# Patient Record
Sex: Female | Born: 1965
Health system: Southern US, Community
[De-identification: ages and names within clinical notes are randomized; demographics above are authoritative.]

## PROBLEM LIST (undated history)

## (undated) DIAGNOSIS — E785 Hyperlipidemia, unspecified: Secondary | ICD-10-CM

## (undated) DIAGNOSIS — I10 Essential (primary) hypertension: Secondary | ICD-10-CM

## (undated) HISTORY — DX: Hyperlipidemia, unspecified: E78.5

## (undated) HISTORY — DX: Essential (primary) hypertension: I10

---

## 2008-06-14 ENCOUNTER — Ambulatory Visit: Payer: Self-pay | Admitting: Internal Medicine

## 2009-07-19 ENCOUNTER — Ambulatory Visit: Payer: Self-pay | Admitting: Internal Medicine

## 2009-09-06 ENCOUNTER — Ambulatory Visit (HOSPITAL_COMMUNITY): Admission: RE | Admit: 2009-09-06 | Discharge: 2009-09-06 | Payer: Self-pay | Admitting: Internal Medicine

## 2010-05-10 ENCOUNTER — Ambulatory Visit (HOSPITAL_COMMUNITY): Admission: RE | Admit: 2010-05-10 | Discharge: 2010-05-10 | Payer: Self-pay

## 2010-08-14 ENCOUNTER — Ambulatory Visit: Payer: Self-pay | Admitting: Internal Medicine

## 2012-01-16 ENCOUNTER — Ambulatory Visit (INDEPENDENT_AMBULATORY_CARE_PROVIDER_SITE_OTHER): Payer: 59 | Admitting: Internal Medicine

## 2012-01-16 ENCOUNTER — Encounter: Payer: Self-pay | Admitting: Internal Medicine

## 2012-01-16 ENCOUNTER — Other Ambulatory Visit (HOSPITAL_COMMUNITY)
Admission: RE | Admit: 2012-01-16 | Discharge: 2012-01-16 | Disposition: A | Discharge status: Home / Self Care | Payer: 59 | Source: Ambulatory Visit | Attending: Internal Medicine | Admitting: Internal Medicine

## 2012-01-16 VITALS — BP 130/82 | HR 108 | Temp 97.8°F | Resp 16 | Ht 70.0 in | Wt 249.8 lb

## 2012-01-16 DIAGNOSIS — E785 Hyperlipidemia, unspecified: Secondary | ICD-10-CM

## 2012-01-16 DIAGNOSIS — Z1159 Encounter for screening for other viral diseases: Secondary | ICD-10-CM | POA: Insufficient documentation

## 2012-01-16 DIAGNOSIS — M545 Low back pain, unspecified: Secondary | ICD-10-CM

## 2012-01-16 DIAGNOSIS — Z01419 Encounter for gynecological examination (general) (routine) without abnormal findings: Secondary | ICD-10-CM | POA: Insufficient documentation

## 2012-01-16 DIAGNOSIS — Z1239 Encounter for other screening for malignant neoplasm of breast: Secondary | ICD-10-CM

## 2012-01-16 DIAGNOSIS — E7800 Pure hypercholesterolemia, unspecified: Secondary | ICD-10-CM | POA: Insufficient documentation

## 2012-01-16 DIAGNOSIS — I10 Essential (primary) hypertension: Secondary | ICD-10-CM | POA: Insufficient documentation

## 2012-01-16 DIAGNOSIS — L719 Rosacea, unspecified: Secondary | ICD-10-CM | POA: Insufficient documentation

## 2012-01-16 DIAGNOSIS — Z0000 Encounter for general adult medical examination without abnormal findings: Secondary | ICD-10-CM

## 2012-01-16 LAB — LIPID PANEL
HDL: 75.4 mg/dL (ref >39.00)
Triglycerides: 61 mg/dL (ref 0.0–149.0)
VLDL: 12.2 mg/dL (ref 0.0–40.0)

## 2012-01-16 LAB — CBC WITH DIFFERENTIAL/PLATELET
Basophils Relative: 0.4 % (ref 0.0–3.0)
HCT: 39.7 % (ref 36.0–46.0)
Hemoglobin: 13.7 g/dL (ref 12.0–15.0)
Lymphs Abs: 2.2 10*3/uL (ref 0.7–4.0)
Neutrophils Relative %: 59 % (ref 43.0–77.0)
WBC: 7 10*3/uL (ref 4.5–10.5)

## 2012-01-16 LAB — COMPREHENSIVE METABOLIC PANEL
ALT: 26 U/L (ref 0–35)
AST: 24 U/L (ref 0–37)
BUN: 11 mg/dL (ref 6–23)
Calcium: 9.3 mg/dL (ref 8.4–10.5)
Chloride: 104 mEq/L (ref 96–112)
GFR: 126.93 mL/min (ref >60.00)
Glucose, Bld: 85 mg/dL (ref 70–99)
Potassium: 3.6 mEq/L (ref 3.5–5.1)
Sodium: 138 mEq/L (ref 135–145)
Total Protein: 7.2 g/dL (ref 6.0–8.3)

## 2012-01-16 LAB — HM PAP SMEAR: HM Pap smear: NEGATIVE

## 2012-01-16 MED ORDER — SIMVASTATIN 10 MG PO TABS: 10.0000 mg | ORAL_TABLET | Freq: Every day | ORAL | Status: DC

## 2012-01-16 MED ORDER — METRONIDAZOLE 1 % EX GEL: Freq: Every day | CUTANEOUS | Status: AC

## 2012-01-16 MED ORDER — HYDROCHLOROTHIAZIDE 25 MG PO TABS: 25.0000 mg | ORAL_TABLET | Freq: Every day | ORAL | Status: DC

## 2012-01-16 NOTE — Assessment & Plan Note (Signed)
Will check lipids and LFTs today. Continue Simvastatin.

## 2012-01-16 NOTE — Assessment & Plan Note (Signed)
Encouraged her to continue with exercises started by Dr. Lorin Picket. Will get plain film of lumbar spine as symptoms have been persistent. If no improvement, will add PT.

## 2012-01-16 NOTE — Progress Notes (Signed)
Subjective:    [name], female    DOB: 1966-06-23, 46 y.o.   MRN: [id number]  HPI 46 year old female with history of hypertension and hyperlipidemia presents to establish care. She reports she is generally feeling well. She notes several year history of gradually worsening lower back pain which radiates down both of her posterior thighs. She notes she talk to her previous physician about this and was given exercises to help with symptoms. She reports no improvement. She continues to use ibuprofen 800 mg once daily with some improvement after the medication. She denies weakness in her legs. She denies any known injury to her lower back.  She is also concerned today about a rash over her forehead and cheeks. This has been present for several months. It seems to get worse and become more red in color when she is anxious her hot. She has not applied any topical treatments, with the exception of Neutrogena spin brush.  In regards to her hypertension and hyperlipidemia she reports full compliance with her medications. She denies any myalgia, chest pain, headache, palpitations. She follows a healthy diet with limited intake of saturated fat and exercises by walking on a regular basis.  Outpatient Encounter Prescriptions as of 01/16/2012  Medication Sig Dispense Refill  . Biotin 10 MG TABS Take by mouth.      . fish oil-omega-3 fatty acids 1000 MG capsule Take 2 g by mouth daily.      . hydrochlorothiazide (HYDRODIURIL) 25 MG tablet Take 1 tablet (25 mg total) by mouth daily.  90 tablet  3  . ibuprofen (ADVIL,MOTRIN) 200 MG tablet Take 200 mg by mouth every 6 (six) hours as needed.      . simvastatin (ZOCOR) 10 MG tablet Take 1 tablet (10 mg total) by mouth at bedtime.  90 tablet  3  . vitamin B-12 (CYANOCOBALAMIN) 100 MCG tablet Take 50 mcg by mouth daily.      . metroNIDAZOLE (METROGEL) 1 % gel Apply topically daily.  45 g  0    Review of Systems  Constitutional: Negative for  fever, chills, appetite change, fatigue and unexpected weight change.  HENT: Negative for ear pain, congestion, sore throat, trouble swallowing, neck pain, voice change and sinus pressure.   Eyes: Negative for visual disturbance.  Respiratory: Negative for cough, shortness of breath, wheezing and stridor.   Cardiovascular: Negative for chest pain, palpitations and leg swelling.  Gastrointestinal: Negative for nausea, vomiting, abdominal pain, diarrhea, constipation, blood in stool, abdominal distention and anal bleeding.  Genitourinary: Negative for dysuria and flank pain.  Musculoskeletal: Positive for back pain. Negative for myalgias, arthralgias and gait problem.  Skin: Positive for rash. Negative for color change.  Neurological: Negative for dizziness and headaches.  Hematological: Negative for adenopathy. Does not bruise/bleed easily.  Psychiatric/Behavioral: Negative for suicidal ideas, sleep disturbance and dysphoric mood. The patient is not nervous/anxious.    BP 130/82  Pulse 108  Temp(Src) 97.8 F (36.6 C) (Oral)  Resp 16  Ht 5\' 10"  (1.778 m)  Wt 249 lb 12 oz (113.286 kg)  BMI 35.84 kg/m2  SpO2 97%     Objective:   Physical Exam  Constitutional: She is oriented to person, place, and time. She appears well-developed and well-nourished. No distress.  HENT:  Head: Normocephalic and atraumatic.  Right Ear: External ear normal.  Left Ear: External ear normal.  Nose: Nose normal.  Mouth/Throat: Oropharynx is clear and moist. No oropharyngeal exudate.  Eyes: Conjunctivae are normal. Pupils are  equal, round, and reactive to light. Right eye exhibits no discharge. Left eye exhibits no discharge. No scleral icterus.  Neck: Normal range of motion. Neck supple. No tracheal deviation present. No thyromegaly present.  Cardiovascular: Normal rate, regular rhythm, normal heart sounds and intact distal pulses.  Exam reveals no gallop and no friction rub.   No murmur  heard. Pulmonary/Chest: Effort normal and breath sounds normal. No respiratory distress. She has no wheezes. She has no rales. She exhibits no tenderness.  Abdominal: Soft. Bowel sounds are normal. She exhibits no distension and no mass. There is no tenderness. There is no rebound and no guarding.  Genitourinary: Rectum normal, vagina normal and uterus normal. No breast swelling, tenderness, discharge or bleeding. Pelvic exam was performed with patient prone. There is no rash, tenderness or lesion on the right labia. There is no rash, tenderness or lesion on the left labia. Uterus is not enlarged and not tender. Cervix exhibits no motion tenderness, no discharge and no friability. Right adnexum displays no mass, no tenderness and no fullness. Left adnexum displays no mass, no tenderness and no fullness. No erythema or tenderness around the vagina. No vaginal discharge found.  Musculoskeletal: Normal range of motion. She exhibits no edema and no tenderness.       Lumbar back: She exhibits pain. She exhibits normal range of motion and no tenderness.  Lymphadenopathy:    She has no cervical adenopathy.  Neurological: She is alert and oriented to person, place, and time. No cranial nerve deficit. She exhibits normal muscle tone. Coordination normal.  Skin: Skin is warm and dry. Rash (few papules over nose, forehead, and cheeks) noted. She is not diaphoretic. No erythema. No pallor.  Psychiatric: She has a normal mood and affect. Her behavior is normal. Judgment and thought content normal.          Assessment & Plan:

## 2012-01-16 NOTE — Assessment & Plan Note (Signed)
Exam is normal today including breast and pelvic exam. PAP is pending.  Will check CBC, CMP, lipids, TSH with labs. Mammogram ordered. Will obtain previous records. Follow up 6 months.

## 2012-01-16 NOTE — Assessment & Plan Note (Signed)
Facial rash most consistent with rosacea. Will try adding Metronidazole gel. If no improvement, will set up dermatology referral.

## 2012-01-16 NOTE — Assessment & Plan Note (Signed)
Breast exam normal today. Mammogram ordered. 

## 2012-01-16 NOTE — Assessment & Plan Note (Signed)
BP well controlled. Will check renal function with labs. Continue HCTZ. Follow up 6 months.

## 2012-01-20 ENCOUNTER — Ambulatory Visit (INDEPENDENT_AMBULATORY_CARE_PROVIDER_SITE_OTHER)
Admission: RE | Admit: 2012-01-20 | Discharge: 2012-01-20 | Disposition: A | Discharge status: Home / Self Care | Payer: 59 | Source: Ambulatory Visit | Attending: Internal Medicine | Admitting: Internal Medicine

## 2012-01-20 DIAGNOSIS — M545 Low back pain, unspecified: Secondary | ICD-10-CM

## 2012-01-24 ENCOUNTER — Telehealth: Payer: Self-pay | Admitting: *Deleted

## 2012-01-24 MED ORDER — FLUCONAZOLE 150 MG PO TABS: 150.0000 mg | ORAL_TABLET | Freq: Once | ORAL | Status: AC

## 2012-01-24 NOTE — Telephone Encounter (Signed)
MyChart mess sent

## 2012-01-24 NOTE — Telephone Encounter (Signed)
Message copied by Vernie Murders on Fri Jan 24, 2012  3:56 PM ------      Message from: Ronna Polio A      Created: Fri Jan 24, 2012  3:44 PM       PAP was normal. HPV was negative. The pathologist saw yeast on smear, so we should treat with Diflucan 150mg  x 1

## 2012-01-26 LAB — HM PAP SMEAR: HM Pap smear: NORMAL

## 2012-02-11 ENCOUNTER — Ambulatory Visit: Payer: Self-pay | Admitting: Internal Medicine

## 2012-02-14 ENCOUNTER — Encounter: Payer: Self-pay | Admitting: Internal Medicine

## 2012-05-26 ENCOUNTER — Telehealth: Payer: Self-pay | Admitting: Internal Medicine

## 2012-05-26 NOTE — Telephone Encounter (Signed)
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 Because of back problems  She is going to dr Corliss Blacker her chiropractor.  Can you do this note or does dr Corliss Blacker need to do this

## 2012-05-26 NOTE — Telephone Encounter (Signed)
I don't really understand this message, but sounds like should get from her chiropractor if he is treating back pain.

## 2012-05-27 ENCOUNTER — Encounter: Payer: Self-pay | Admitting: Internal Medicine

## 2012-05-27 NOTE — Telephone Encounter (Signed)
Spoke with patient and she stated that she needs a letter from Dr. Dan Humphreys stating that she has seen a Chiropractor and should not be doing any physical activity due to her back problem, slipped disc at L4-L5.  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  She would like to pick up letter this afternoon.

## 2012-05-27 NOTE — Telephone Encounter (Signed)
Left message on cell phone voicemail for patient to return call. 

## 2012-07-22 ENCOUNTER — Ambulatory Visit: Payer: 59 | Admitting: Internal Medicine

## 2012-07-24 ENCOUNTER — Ambulatory Visit (INDEPENDENT_AMBULATORY_CARE_PROVIDER_SITE_OTHER): Payer: 59 | Admitting: Internal Medicine

## 2012-07-24 ENCOUNTER — Encounter: Payer: Self-pay | Admitting: Internal Medicine

## 2012-07-24 VITALS — BP 132/80 | HR 67 | Temp 98.8°F | Ht 70.0 in | Wt 247.5 lb

## 2012-07-24 DIAGNOSIS — E559 Vitamin D deficiency, unspecified: Secondary | ICD-10-CM | POA: Insufficient documentation

## 2012-07-24 DIAGNOSIS — I10 Essential (primary) hypertension: Secondary | ICD-10-CM

## 2012-07-24 DIAGNOSIS — E785 Hyperlipidemia, unspecified: Secondary | ICD-10-CM

## 2012-07-24 DIAGNOSIS — M545 Low back pain, unspecified: Secondary | ICD-10-CM

## 2012-07-24 LAB — COMPREHENSIVE METABOLIC PANEL
AST: 21 U/L (ref 0–37)
Albumin: 3.9 g/dL (ref 3.5–5.2)
BUN: 11 mg/dL (ref 6–23)
CO2: 26 mEq/L (ref 19–32)
Calcium: 9.4 mg/dL (ref 8.4–10.5)
Creatinine, Ser: 0.7 mg/dL (ref 0.4–1.2)
Glucose, Bld: 92 mg/dL (ref 70–99)

## 2012-07-24 NOTE — Assessment & Plan Note (Signed)
Patient with ongoing issues with lower back pain likely secondary to spondylolisthesis. Orthopedic evaluation is pending. Symptoms currently improved with use of ibuprofen as needed. Will continue.

## 2012-07-24 NOTE — Assessment & Plan Note (Signed)
Blood pressure is generally been well controlled on hydrochlorothiazide. We'll continue. Will check renal function with labs today. Follow up 6 months or sooner as needed.

## 2012-07-24 NOTE — Assessment & Plan Note (Signed)
Patient with history of vitamin D deficiency. Will check vitamin D level with labs today.

## 2012-07-24 NOTE — Assessment & Plan Note (Signed)
Will check lipids and LFTs with labs today. Continue simvastatin. 

## 2012-07-24 NOTE — Progress Notes (Signed)
Subjective:    [name], female    DOB: Jul 18, 1966, 46 y.o.   MRN: [id number]  HPI 46 year old female with history of hypertension, hyperlipidemia, and low back pain presents for followup. She reports she is generally been doing well. She reports that back pain has been persistent. Plain x-ray of the lumbar spine performed at her last visit showed anterior slippage of L4. He was recommended that she be evaluated by orthopedics. She has not yet scheduled this appointment but is planning to schedule evaluation after her upcoming vacation. She reports that back pain is currently managed with use of ibuprofen.  In regards to hypertension and hyperlipidemia, she reports full compliance with medications. She does not regularly check her blood pressure. She denies any chest pain, headache, palpitations.  Outpatient Encounter Prescriptions as of 07/24/2012  Medication Sig Dispense Refill  . Biotin 10 MG TABS Take by mouth.      . fish oil-omega-3 fatty acids 1000 MG capsule Take 2 g by mouth daily.      . hydrochlorothiazide (HYDRODIURIL) 25 MG tablet Take 1 tablet (25 mg total) by mouth daily.  90 tablet  3  . ibuprofen (ADVIL,MOTRIN) 200 MG tablet Take 200 mg by mouth every 6 (six) hours as needed.      . metroNIDAZOLE (METROGEL) 1 % gel Apply topically daily.  45 g  0  . simvastatin (ZOCOR) 10 MG tablet Take 1 tablet (10 mg total) by mouth at bedtime.  90 tablet  3  . vitamin B-12 (CYANOCOBALAMIN) 100 MCG tablet Take 50 mcg by mouth daily.       BP 132/80  Pulse 67  Temp 98.8 F (37.1 C) (Oral)  Ht 5\' 10"  (1.778 m)  Wt 247 lb 8 oz (112.265 kg)  BMI 35.51 kg/m2  SpO2 98%  Review of Systems  Constitutional: Negative for fever, chills, appetite change, fatigue and unexpected weight change.  HENT: Negative for ear pain, congestion, sore throat, trouble swallowing, neck pain, voice change and sinus pressure.   Eyes: Negative for visual disturbance.  Respiratory: Negative  for cough, shortness of breath, wheezing and stridor.   Cardiovascular: Negative for chest pain, palpitations and leg swelling.  Gastrointestinal: Negative for nausea, vomiting, abdominal pain, diarrhea, constipation, blood in stool, abdominal distention and anal bleeding.  Genitourinary: Negative for dysuria and flank pain.  Musculoskeletal: Positive for myalgias and arthralgias. Negative for gait problem.  Skin: Negative for color change and rash.  Neurological: Negative for dizziness and headaches.  Hematological: Negative for adenopathy. Does not bruise/bleed easily.  Psychiatric/Behavioral: Negative for suicidal ideas, disturbed wake/sleep cycle and dysphoric mood. The patient is not nervous/anxious.        Objective:   Physical Exam  Constitutional: She is oriented to person, place, and time. She appears well-developed and well-nourished. No distress.  HENT:  Head: Normocephalic and atraumatic.  Right Ear: External ear normal.  Left Ear: External ear normal.  Nose: Nose normal.  Mouth/Throat: Oropharynx is clear and moist. No oropharyngeal exudate.  Eyes: Conjunctivae normal are normal. Pupils are equal, round, and reactive to light. Right eye exhibits no discharge. Left eye exhibits no discharge. No scleral icterus.  Neck: Normal range of motion. Neck supple. No tracheal deviation present. No thyromegaly present.  Cardiovascular: Normal rate, regular rhythm, normal heart sounds and intact distal pulses.  Exam reveals no gallop and no friction rub.   No murmur heard. Pulmonary/Chest: Effort normal and breath sounds normal. No respiratory distress. She has no wheezes. She  has no rales. She exhibits no tenderness.  Abdominal: Soft. Bowel sounds are normal. She exhibits no distension and no mass. There is no tenderness. There is no guarding.  Musculoskeletal: Normal range of motion. She exhibits no edema and no tenderness.  Lymphadenopathy:    She has no cervical adenopathy.    Neurological: She is alert and oriented to person, place, and time. No cranial nerve deficit. She exhibits normal muscle tone. Coordination normal.  Skin: Skin is warm and dry. No rash noted. She is not diaphoretic. No erythema. No pallor.  Psychiatric: She has a normal mood and affect. Her behavior is normal. Judgment and thought content normal.          Assessment & Plan:

## 2012-08-04 LAB — VITAMIN D 25 HYDROXY (VIT D DEFICIENCY, FRACTURES): Vit D, 25-Hydroxy: 56 ng/mL (ref 30–89)

## 2012-08-24 ENCOUNTER — Telehealth: Payer: Self-pay | Admitting: Internal Medicine

## 2012-08-24 NOTE — Telephone Encounter (Signed)
Caller: [name]/Patient; Patient Name: Paula Wallace; PCP: Ronna Polio (Adults only); Best Callback Phone Number: [phone] Has nasal congestion "for more than 1 week". Has occasional cough. Has been taking Mucinex and helps thin secretions. Has coughed up yellow mucus 11-5. Per Upper Respiratory Infection protocol, appointment scheduled for 11-5 at 1100 with Dr. Darrick Huntsman.

## 2012-08-25 ENCOUNTER — Ambulatory Visit (INDEPENDENT_AMBULATORY_CARE_PROVIDER_SITE_OTHER): Payer: 59 | Admitting: Internal Medicine

## 2012-08-25 ENCOUNTER — Telehealth: Payer: Self-pay | Admitting: Internal Medicine

## 2012-08-25 ENCOUNTER — Encounter: Payer: Self-pay | Admitting: Internal Medicine

## 2012-08-25 VITALS — BP 130/82 | HR 63 | Temp 98.6°F | Resp 12 | Ht 71.0 in | Wt 249.5 lb

## 2012-08-25 DIAGNOSIS — H6691 Otitis media, unspecified, right ear: Secondary | ICD-10-CM

## 2012-08-25 DIAGNOSIS — H6690 Otitis media, unspecified, unspecified ear: Secondary | ICD-10-CM

## 2012-08-25 MED ORDER — AMOXICILLIN-POT CLAVULANATE 875-125 MG PO TABS: 1.0000 | ORAL_TABLET | Freq: Two times a day (BID) | ORAL | Status: DC

## 2012-08-25 NOTE — Progress Notes (Signed)
[name], female   DOB: [birth year], 46 y.o.   MRN: [id number]   Patient Active Problem List  Diagnosis  . General medical exam  . Lumbago  . Screening for breast cancer  . Rosacea  . Hypertension  . Hyperlipidemia LDL goal < 100  . Vitamin D deficiency  . Otitis media of right ear    Subjective:  CC:   Chief Complaint  Patient presents with  . Nasal Congestion    HPI:   Paula Wallace a 46 y.o. female who presents with nasal congestion and ear pain since Oct 26th started with nasal drainage while on a cruise to the [place] .    mucinex  Not helping . Color of drainage has been clear,  No fevers.  No facial pain,  But she is coughing a lot and sputum has a yellowish tinge and is mostly produced in the mornings,  Not during the day      Past Medical History  Diagnosis Date  . Hypertension   . Hyperlipidemia     Past Surgical History  Procedure Date  . Vaginal delivery 1989    episiotomy, Dr. Willeen Cass         The following portions of the patient's history were reviewed and updated as appropriate: Allergies, current medications, and problem list.    Review of Systems:   12 Pt  review of systems was negative except those addressed in the HPI,     History   Social History  . Marital Status: Single    Spouse Name: N/A    Number of Children: N/A  . Years of Education: N/A   Occupational History  . Not on file.   Social History Main Topics  . Smoking status: Current Some Day Smoker    Types: Cigarettes  . Smokeless tobacco: Never Used  . Alcohol Use: Yes  . Drug Use: No  . Sexually Active: Not on file   Other Topics Concern  . Not on file   Social History Narrative   Lives in [place]. 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 2 grandchildren, 1 son.Diet: regular diet, limits fried foodExercise: walks goal daily    Objective:  BP 130/82  Pulse 63  Temp 98.6 F (37 C) (Oral)  Resp 12  Ht 5\' 11"  (1.803 m)   Wt 249 lb 8 oz (113.172 kg)  BMI 34.80 kg/m2  SpO2 97%  General appearance: alert, cooperative and appears stated age Ears: bilateral erythematous T'Ms, serous effusion on the right  HEENTt: lips, mucosa, and tongue normal; teeth and gums normal Neck: no adenopathy, no carotid bruit, supple, symmetrical, trachea midline and thyroid not enlarged, symmetric, no tenderness/mass/nodules Back: symmetric, no curvature. ROM normal. No CVA tenderness. Lungs: clear to auscultation bilaterally Heart: regular rate and rhythm, S1, S2 normal, no murmur, click, rub or gallop Abdomen: soft, non-tender; bowel sounds normal; no masses,  no organomegaly Pulses: 2+ and symmetric Skin: Skin color, texture, turgor normal. No rashes or lesions Lymph nodes: Cervical, supraclavicular, and axillary nodes normal.  Assessment and Plan:  Otitis media of right ear Secondary to sinus congestion leading to cirrhosis fusion on the right. Decongestants antibiotics and saline lavage ordered.   Updated Medication List Outpatient Encounter Prescriptions as of 08/25/2012  Medication Sig Dispense Refill  . hydrochlorothiazide (HYDRODIURIL) 25 MG tablet Take 1 tablet (25 mg total) by mouth daily.  90 tablet  3  . ibuprofen (ADVIL,MOTRIN) 200 MG tablet Take 200 mg by mouth every 6 (  six) hours as needed.      . metroNIDAZOLE (METROGEL) 1 % gel Apply topically daily.  45 g  0  . simvastatin (ZOCOR) 10 MG tablet Take 1 tablet (10 mg total) by mouth at bedtime.  90 tablet  3  . vitamin B-12 (CYANOCOBALAMIN) 100 MCG tablet Take 50 mcg by mouth daily.      Marland Kitchen amoxicillin-clavulanate (AUGMENTIN) 875-125 MG per tablet Take 1 tablet by mouth 2 (two) times daily.  14 tablet  0  . Biotin 10 MG TABS Take by mouth.      . fish oil-omega-3 fatty acids 1000 MG capsule Take 2 g by mouth daily.         No orders of the defined types were placed in this encounter.    No Follow-up on file.

## 2012-08-25 NOTE — Telephone Encounter (Signed)
Ok.  If ok with Dr Dan Humphreys

## 2012-08-25 NOTE — Telephone Encounter (Signed)
That is fine with me.

## 2012-08-25 NOTE — Telephone Encounter (Signed)
Pt would like to switch from dr walker to dr scott.  Dr Lorin Picket was her previous dr at [facility] clinic

## 2012-08-25 NOTE — Patient Instructions (Addendum)
You have a sinus/ear infection   .  I am prescribing an antibiotic augmentin To manage the infection I also advise use of the following OTC meds to help with your other symptoms.   Take generic OTC benadryl 25 mg every 8 hours for the drainage,  Sudafed PE  10 to 30 mg every 8 hours for the congestion, you may substitute Afrin nasal spray for the nighttime dose of sudafed PE  If needed to prevent insomnia.  flushes your sinuses twice daily with Simply Saline (do over the sink because if you do it right you will spit out globs of mucus)  Use OTC  Delsym   FOR THE COUGH.  Call if you develop a yeast infection.  Try eating [place] yogurt daily (Dannon light and fiet has 80 cal /8 carbs)

## 2012-08-27 ENCOUNTER — Encounter: Payer: Self-pay | Admitting: Internal Medicine

## 2012-08-27 NOTE — Assessment & Plan Note (Signed)
Secondary to sinus congestion leading to cirrhosis fusion on the right. Decongestants antibiotics and saline lavage ordered.

## 2012-08-27 NOTE — Telephone Encounter (Signed)
Left message for pt to call office  Dr walker and dr Lorin Picket was ok with pt going back to dr scott.  Pt saw dr Lorin Picket at [facility] Need to r/s cpx appointment from dr walker to dr Lorin Picket

## 2012-08-27 NOTE — Telephone Encounter (Signed)
Pt aware switch cpx appointment from dr walker to dr Lorin Picket

## 2012-10-22 ENCOUNTER — Telehealth: Payer: Self-pay | Admitting: Internal Medicine

## 2012-10-22 DIAGNOSIS — M549 Dorsalgia, unspecified: Secondary | ICD-10-CM

## 2012-10-22 NOTE — Telephone Encounter (Signed)
Patient wanting a referral put in for a Neuro Surgeon  Dr. Lynnda Shields # [phone].

## 2012-10-22 NOTE — Telephone Encounter (Signed)
What does she needs referral for.  Need more info

## 2012-10-22 NOTE — Telephone Encounter (Signed)
Left message for patient to return call.

## 2012-10-23 ENCOUNTER — Other Ambulatory Visit: Payer: Self-pay | Admitting: Internal Medicine

## 2012-10-23 NOTE — Telephone Encounter (Signed)
Placed order for referral.

## 2012-10-23 NOTE — Progress Notes (Signed)
Opened in error

## 2012-10-23 NOTE — Telephone Encounter (Signed)
Patient had xray's last may at [facility] , per Dan Humphreys she said L4 and L5 showed some forward slippage. Has been seeing a Land. But pain is worse now so she would like to be referred to Dr. Lynnda Shields.

## 2012-10-23 NOTE — Telephone Encounter (Signed)
Order placed for referral to neurosurgery.  

## 2012-11-11 ENCOUNTER — Other Ambulatory Visit (HOSPITAL_COMMUNITY): Payer: Self-pay | Admitting: Neurosurgery

## 2012-11-11 DIAGNOSIS — M545 Low back pain, unspecified: Secondary | ICD-10-CM

## 2012-11-25 ENCOUNTER — Ambulatory Visit (HOSPITAL_COMMUNITY): Admission: RE | Admit: 2012-11-25 | Payer: 59 | Source: Ambulatory Visit

## 2012-11-25 ENCOUNTER — Ambulatory Visit (HOSPITAL_COMMUNITY)
Admission: RE | Admit: 2012-11-25 | Discharge: 2012-11-25 | Disposition: A | Discharge status: Home / Self Care | Payer: 59 | Source: Ambulatory Visit | Attending: Neurosurgery | Admitting: Neurosurgery

## 2012-11-25 DIAGNOSIS — M545 Low back pain, unspecified: Secondary | ICD-10-CM | POA: Insufficient documentation

## 2012-11-25 DIAGNOSIS — M47817 Spondylosis without myelopathy or radiculopathy, lumbosacral region: Secondary | ICD-10-CM | POA: Insufficient documentation

## 2012-11-25 DIAGNOSIS — R29898 Other symptoms and signs involving the musculoskeletal system: Secondary | ICD-10-CM | POA: Insufficient documentation

## 2012-11-25 DIAGNOSIS — M5126 Other intervertebral disc displacement, lumbar region: Secondary | ICD-10-CM | POA: Insufficient documentation

## 2012-11-25 DIAGNOSIS — M79609 Pain in unspecified limb: Secondary | ICD-10-CM | POA: Insufficient documentation

## 2012-11-25 DIAGNOSIS — Q762 Congenital spondylolisthesis: Secondary | ICD-10-CM | POA: Insufficient documentation

## 2012-11-25 DIAGNOSIS — R209 Unspecified disturbances of skin sensation: Secondary | ICD-10-CM | POA: Insufficient documentation

## 2012-12-05 ENCOUNTER — Other Ambulatory Visit: Payer: Self-pay

## 2013-01-22 ENCOUNTER — Other Ambulatory Visit: Payer: Self-pay | Admitting: Internal Medicine

## 2013-01-25 ENCOUNTER — Ambulatory Visit (INDEPENDENT_AMBULATORY_CARE_PROVIDER_SITE_OTHER): Payer: 59 | Admitting: Internal Medicine

## 2013-01-25 ENCOUNTER — Encounter: Payer: Self-pay | Admitting: Internal Medicine

## 2013-01-25 ENCOUNTER — Encounter: Payer: 59 | Admitting: Internal Medicine

## 2013-01-25 VITALS — BP 120/82 | HR 81 | Temp 98.4°F | Ht 70.5 in | Wt 244.0 lb

## 2013-01-25 DIAGNOSIS — E785 Hyperlipidemia, unspecified: Secondary | ICD-10-CM

## 2013-01-25 DIAGNOSIS — I10 Essential (primary) hypertension: Secondary | ICD-10-CM

## 2013-01-25 DIAGNOSIS — R9431 Abnormal electrocardiogram [ECG] [EKG]: Secondary | ICD-10-CM

## 2013-01-25 DIAGNOSIS — Z0000 Encounter for general adult medical examination without abnormal findings: Secondary | ICD-10-CM

## 2013-01-25 NOTE — Assessment & Plan Note (Signed)
Baseline EKG abnormal with right bundle branch block. Patient has risk factors for coronary artery disease including hypertension, hyperlipidemia. Will set up cardiology evaluation. Question if stress test might be helpful for further risk stratification.

## 2013-01-25 NOTE — Assessment & Plan Note (Signed)
General medical exam including breast exam normal today. Pap deferred as this was normal in 2013, HPV negative. Will schedule mammogram. Vaccinations are up to date with the exception of tetanus shot. Will request records on previous tetanus shot from employee health. Will check basic labs including CBC, CMP, lipid profile. Encourage continued efforts at healthy diet and regular physical activity.

## 2013-01-25 NOTE — Assessment & Plan Note (Signed)
Will check lipids with labs today. Continue simvastatin.

## 2013-01-25 NOTE — Progress Notes (Signed)
Subjective:    [name], female    DOB: [birth year], 47 y.o.   MRN: [id number]  HPI 47 year old female with history of hypertension and hyperlipidemia presents for annual exam. She reports she is generally feeling well. She tries to follow a healthy diet. She tries to exercise, but exercise is limited by responsibilities at work and caring for her elderly mother with dementia. She is compliant with her medications. She is up-to-date on vaccinations, with the exception of tetanus which is unknown. She denies any new concerns today.  Outpatient Encounter Prescriptions as of 01/25/2013  Medication Sig Dispense Refill  . Biotin 10 MG TABS Take by mouth.      . Cholecalciferol (VITAMIN D-3) 1000 UNITS CAPS Take 1,000 Units by mouth daily.      . hydrochlorothiazide (HYDRODIURIL) 25 MG tablet TAKE 1 TABLET BY MOUTH DAILY.  90 tablet  0  . ibuprofen (ADVIL,MOTRIN) 200 MG tablet Take 200 mg by mouth every 6 (six) hours as needed.      . simvastatin (ZOCOR) 10 MG tablet Take 1 tablet (10 mg total) by mouth at bedtime.  90 tablet  3  . vitamin B-12 (CYANOCOBALAMIN) 100 MCG tablet Take 50 mcg by mouth daily.      . fish oil-omega-3 fatty acids 1000 MG capsule Take 2 g by mouth daily.       No facility-administered encounter medications on file as of 01/25/2013.   BP 120/82  Pulse 81  Temp(Src) 98.4 F (36.9 C) (Oral)  Ht 5' 10.5" (1.791 m)  Wt 244 lb (110.678 kg)  BMI 34.5 kg/m2  SpO2 97%  Review of Systems  Constitutional: Negative for fever, chills, appetite change, fatigue and unexpected weight change.  HENT: Negative for ear pain, congestion, sore throat, trouble swallowing, neck pain, voice change and sinus pressure.   Eyes: Negative for visual disturbance.  Respiratory: Negative for cough, shortness of breath, wheezing and stridor.   Cardiovascular: Negative for chest pain, palpitations and leg swelling.  Gastrointestinal: Negative for nausea, vomiting, abdominal pain,  diarrhea, constipation, blood in stool, abdominal distention and anal bleeding.  Genitourinary: Negative for dysuria and flank pain.  Musculoskeletal: Negative for myalgias, arthralgias and gait problem.  Skin: Negative for color change and rash.  Neurological: Negative for dizziness and headaches.  Hematological: Negative for adenopathy. Does not bruise/bleed easily.  Psychiatric/Behavioral: Negative for suicidal ideas, sleep disturbance and dysphoric mood. The patient is not nervous/anxious.        Objective:   Physical Exam  Constitutional: She is oriented to person, place, and time. She appears well-developed and well-nourished. No distress.  HENT:  Head: Normocephalic and atraumatic.  Right Ear: External ear normal.  Left Ear: External ear normal.  Nose: Nose normal.  Mouth/Throat: Oropharynx is clear and moist. No oropharyngeal exudate.  Eyes: Conjunctivae are normal. Pupils are equal, round, and reactive to light. Right eye exhibits no discharge. Left eye exhibits no discharge. No scleral icterus.  Neck: Normal range of motion. Neck supple. No tracheal deviation present. No thyromegaly present.  Cardiovascular: Normal rate, regular rhythm, normal heart sounds and intact distal pulses.  Exam reveals no gallop and no friction rub.   No murmur heard. Pulmonary/Chest: Effort normal and breath sounds normal. No accessory muscle usage. Not tachypneic. No respiratory distress. She has no decreased breath sounds. She has no wheezes. She has no rhonchi. She has no rales. She exhibits no tenderness. Right breast exhibits no inverted nipple, no mass, no nipple discharge, no skin  change and no tenderness. Left breast exhibits no inverted nipple, no mass, no nipple discharge, no skin change and no tenderness. Breasts are symmetrical.  Abdominal: Soft. Bowel sounds are normal. She exhibits no distension. There is no tenderness. There is no rebound and no guarding.  Musculoskeletal: Normal range of  motion. She exhibits no edema and no tenderness.  Lymphadenopathy:    She has no cervical adenopathy.  Neurological: She is alert and oriented to person, place, and time. No cranial nerve deficit. She exhibits normal muscle tone. Coordination normal.  Skin: Skin is warm and dry. No rash noted. She is not diaphoretic. No erythema. No pallor.  Psychiatric: She has a normal mood and affect. Her behavior is normal. Judgment and thought content normal.          Assessment & Plan:

## 2013-01-25 NOTE — Assessment & Plan Note (Signed)
BP Readings from Last 3 Encounters:  01/25/13 120/82  08/25/12 130/82  07/24/12 132/80   BP well controlled on HCTZ. Will continue. Will check renal function with labs today.

## 2013-01-26 ENCOUNTER — Telehealth: Payer: Self-pay | Admitting: *Deleted

## 2013-01-26 LAB — COMPREHENSIVE METABOLIC PANEL
ALT: 36 U/L — ABNORMAL HIGH (ref 0–35)
AST: 30 U/L (ref 0–37)
Alkaline Phosphatase: 51 U/L (ref 39–117)
Creatinine, Ser: 0.7 mg/dL (ref 0.4–1.2)
GFR: 117.93 mL/min (ref >60.00)
Sodium: 138 mEq/L (ref 135–145)
Total Bilirubin: 0.6 mg/dL (ref 0.3–1.2)
Total Protein: 7.3 g/dL (ref 6.0–8.3)

## 2013-01-26 LAB — CBC WITH DIFFERENTIAL/PLATELET
Basophils Relative: 0.3 % (ref 0.0–3.0)
Eosinophils Relative: 2 % (ref 0.0–5.0)
Lymphocytes Relative: 32.4 % (ref 12.0–46.0)
MCV: 91.7 fl (ref 78.0–100.0)
Monocytes Absolute: 0.5 10*3/uL (ref 0.1–1.0)
Neutrophils Relative %: 58 % (ref 43.0–77.0)
Platelets: 247 10*3/uL (ref 150.0–400.0)
RBC: 4.34 Mil/uL (ref 3.87–5.11)
WBC: 6.2 10*3/uL (ref 4.5–10.5)

## 2013-01-26 LAB — URINALYSIS
Bilirubin Urine: NEGATIVE
Hgb urine dipstick: NEGATIVE
Leukocytes, UA: NEGATIVE
Nitrite: NEGATIVE
Total Protein, Urine: NEGATIVE
Urobilinogen, UA: 0.2 (ref 0.0–1.0)

## 2013-01-26 LAB — LIPID PANEL
HDL: 45.9 mg/dL (ref >39.00)
LDL Cholesterol: 95 mg/dL (ref 0–99)
Total CHOL/HDL Ratio: 3
VLDL: 13.8 mg/dL (ref 0.0–40.0)

## 2013-01-26 NOTE — Telephone Encounter (Signed)
Message copied by Theola Sequin on Tue Jan 26, 2013  1:48 PM ------      Message from: Ronna Polio A      Created: Mon Jan 25, 2013  3:53 PM       Can you let pt know that there were mild abnormalities in her EKG. I would like to set her up with cardiology for evaluation. Is she okay with this? ------

## 2013-01-26 NOTE — Telephone Encounter (Signed)
Patient returned my call and was informed of her EKG, but at this time she would prefer not to be referred to cardio. State she will call back early next week with a definite answer.

## 2013-01-26 NOTE — Telephone Encounter (Signed)
Charlene - See below

## 2013-01-26 NOTE — Telephone Encounter (Signed)
OK. The changes were mild.Given her risk factors, they might want to proceed with stress test. Paula Wallace - This was your former pt. She is changing providers back to you. Just FYI.

## 2013-01-27 NOTE — Progress Notes (Signed)
Agree with repeat LFTs in one month.  Per note pt notified.

## 2013-01-27 NOTE — Telephone Encounter (Signed)
Pt notified that Dr. Lorin Picket said to continue with the referral to cardiology.

## 2013-01-27 NOTE — Telephone Encounter (Signed)
Reviewed attached information.  Please notify pt that I agree with cardiology referral.  Let me know if any problems.

## 2013-01-28 NOTE — Telephone Encounter (Signed)
Patient called back and left a message on voicemail stating someone called her yesterday. She does not want to see a cardiologist at this time, she prefer to hold off on this for now so please do not refer her.

## 2013-03-01 ENCOUNTER — Other Ambulatory Visit: Payer: Self-pay | Admitting: Internal Medicine

## 2013-04-27 ENCOUNTER — Other Ambulatory Visit: Payer: Self-pay | Admitting: Internal Medicine

## 2013-05-12 ENCOUNTER — Telehealth: Payer: Self-pay | Admitting: Internal Medicine

## 2013-05-12 NOTE — Telephone Encounter (Signed)
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  Pt states these records should be in her chart, she has had an MRI done at [facility].  Please contact pt if further details are needed.  Pt needs note before 05/27/2013.

## 2013-05-12 NOTE — Telephone Encounter (Signed)
LMTCB

## 2013-05-12 NOTE — Telephone Encounter (Signed)
I have not seen her in this office since I have been here.  I used to see her at [place], but have not seen her here.  I do not mind doing the letter, but I need to have documentation here regarding the issue (where I have seen her).  Can schedule an appt for evaluation.

## 2013-05-13 NOTE — Telephone Encounter (Signed)
No she should set up a visit with Dr. Lorin Picket.

## 2013-05-13 NOTE — Telephone Encounter (Signed)
Pt states that she has seen Dr. Dan Humphreys recently & would like to ask her if she will write the note (see below)

## 2013-05-14 NOTE — Telephone Encounter (Signed)
Left voicmail to inform pt that she would need to be seen. Pt is a current patient with Dr. Dan Humphreys who wants to switch to Dr. Lorin Picket because she has seen her at [facility]. Both doctors agree that pt needs to be seen by Dr. Lorin Picket before we can give her a letter for work.

## 2013-05-17 ENCOUNTER — Telehealth: Payer: Self-pay | Admitting: Internal Medicine

## 2013-05-17 NOTE — Telephone Encounter (Signed)
Next appt available/scheduled for 8/12.  Pt states she was to have a note for work by 8/6.  Pt will call to see if we have cancellation before 8/12.  Pt also asking if this will be a follow up that can cancel out visit scheduled in October or if she will need to keep both appointments.

## 2013-05-26 ENCOUNTER — Ambulatory Visit: Payer: Self-pay | Admitting: Internal Medicine

## 2013-05-26 LAB — HM MAMMOGRAPHY

## 2013-05-31 ENCOUNTER — Telehealth: Payer: Self-pay | Admitting: Internal Medicine

## 2013-05-31 NOTE — Telephone Encounter (Signed)
FYI to Dr. Lorin Picket from pt:  Pt had to cancel appt 8/12 @ 4:00 due to her mother passing away.  Pt will call back to r/s.

## 2013-05-31 NOTE — Telephone Encounter (Signed)
noted 

## 2013-06-01 ENCOUNTER — Ambulatory Visit: Payer: 59 | Admitting: Internal Medicine

## 2013-06-08 ENCOUNTER — Telehealth: Payer: Self-pay | Admitting: Internal Medicine

## 2013-06-08 NOTE — Telephone Encounter (Signed)
Pt had to r/s her last appt because of a death in the family and was needing to get back in with you but can't wait until September 16th or can she see Raquel Rey ?? It was a f/u and to get a note about a class she is taking.

## 2013-06-09 NOTE — Telephone Encounter (Signed)
I could see her as a work in on 06/15/13 at 11:45.

## 2013-06-09 NOTE — Telephone Encounter (Signed)
scheduled

## 2013-06-15 ENCOUNTER — Encounter: Payer: Self-pay | Admitting: Internal Medicine

## 2013-06-15 ENCOUNTER — Ambulatory Visit (INDEPENDENT_AMBULATORY_CARE_PROVIDER_SITE_OTHER): Payer: 59 | Admitting: Internal Medicine

## 2013-06-15 VITALS — BP 122/80 | HR 85 | Temp 98.8°F | Ht 70.5 in | Wt 243.5 lb

## 2013-06-15 DIAGNOSIS — M549 Dorsalgia, unspecified: Secondary | ICD-10-CM

## 2013-06-15 DIAGNOSIS — Z7189 Other specified counseling: Secondary | ICD-10-CM

## 2013-06-15 DIAGNOSIS — E785 Hyperlipidemia, unspecified: Secondary | ICD-10-CM

## 2013-06-15 DIAGNOSIS — Z1239 Encounter for other screening for malignant neoplasm of breast: Secondary | ICD-10-CM

## 2013-06-15 DIAGNOSIS — I10 Essential (primary) hypertension: Secondary | ICD-10-CM

## 2013-06-15 DIAGNOSIS — Z716 Tobacco abuse counseling: Secondary | ICD-10-CM

## 2013-06-16 ENCOUNTER — Ambulatory Visit: Payer: Self-pay | Admitting: Internal Medicine

## 2013-06-20 ENCOUNTER — Encounter: Payer: Self-pay | Admitting: Internal Medicine

## 2013-06-20 DIAGNOSIS — Z716 Tobacco abuse counseling: Secondary | ICD-10-CM | POA: Insufficient documentation

## 2013-06-20 DIAGNOSIS — M549 Dorsalgia, unspecified: Secondary | ICD-10-CM | POA: Insufficient documentation

## 2013-06-20 NOTE — Assessment & Plan Note (Signed)
BP Readings from Last 3 Encounters:  06/15/13 122/80  01/25/13 120/82  08/25/12 130/82   BP well controlled on HCTZ. Will continue.  Follow metabolic panel.

## 2013-06-20 NOTE — Assessment & Plan Note (Signed)
Need results of last mammogram.

## 2013-06-20 NOTE — Assessment & Plan Note (Signed)
Continue simvastatin.  Check lipid panel and liver function.    

## 2013-06-20 NOTE — Assessment & Plan Note (Signed)
Discussed the need to quit smoking.  She is trying to stop on her own.  Follow.

## 2013-06-20 NOTE — Progress Notes (Signed)
  Subjective:    [name], female    DOB: Dec 28, 1965, 47 y.o.   MRN: [id number]  HPI 47 year old female with past history of hypertension, hypercholesterolemia and back pain with MRI revealing severe bilateral facet arthritis with spondylolisthesis and small disc protrusion.  She comes in today for a scheduled follow up and to discuss her back pain.  She does a lot of lifting and pulling at work.  This aggravates her back.  She will notice intermittent lower back pain and pain extending into her upper thighs.  Flares intermittently.  Occurs 2-3x/month.  Still smoking.  We discussed the need to stop.  Increased stress recently with her mother's death.  She feels she is coping relatively well.  Eating and drinking well.  Breathing stable.  Some irritation and soreness in her nose.     Past Medical History  Diagnosis Date  . Hypertension   . Hyperlipidemia     Outpatient Encounter Prescriptions as of 06/15/2013  Medication Sig Dispense Refill  . Cholecalciferol (VITAMIN D-3) 1000 UNITS CAPS Take 1,000 Units by mouth daily.      . hydrochlorothiazide (HYDRODIURIL) 25 MG tablet TAKE 1 TABLET BY MOUTH ONCE DAILY  90 tablet  1  . ibuprofen (ADVIL,MOTRIN) 200 MG tablet Take 200 mg by mouth every 6 (six) hours as needed.      . Multiple Vitamins-Calcium (ONE-A-DAY WOMENS FORMULA PO) Take by mouth.      . simvastatin (ZOCOR) 10 MG tablet TAKE 1 TABLET BY MOUTH AT BEDTIME.  90 tablet  3  . vitamin B-12 (CYANOCOBALAMIN) 100 MCG tablet Take 50 mcg by mouth daily.      . [DISCONTINUED] Biotin 10 MG TABS Take by mouth.      . [DISCONTINUED] fish oil-omega-3 fatty acids 1000 MG capsule Take 2 g by mouth daily.       No facility-administered encounter medications on file as of 06/15/2013.    Review of Systems Patient denies any headache, lightheadedness or dizziness.  No sinus or allergy symptoms.  some soreness in her nose.  No chest pain, tightness or palpitations.  No increased  shortness of breath, cough or congestion.  No nausea or vomiting.  No acid reflux.  No abdominal pain or cramping.  No bowel change, such as diarrhea, constipation, BRBPR or melana.  No urine change.  Back pain and upper thigh pain - intermittent flares as outlined.  Worsened by lifting and pulling.  Continues to smoke. Discussed the need to quit.       Objective:   Physical Exam Filed Vitals:   06/15/13 1158  BP: 122/80  Pulse: 85  Temp: 98.8 F (25.65 C)   47 year old female in no acute distress.   HEENT:  Nares- clear.  Oropharynx - without lesions. NECK:  Supple.  Nontender.  No audible bruit.  HEART:  Appears to be regular. LUNGS:  No crackles or wheezing audible.  Respirations even and unlabored.  RADIAL PULSE:  Equal bilaterally.  ABDOMEN:  Soft, nontender.  Bowel sounds present and normal.  No audible abdominal bruit.    EXTREMITIES:  No increased edema present.  DP pulses palpable and equal bilaterally.      BACK:  No tenderness to palpation over her back.  Some discomfort with straight leg raise.  No pain radiating down the legs.       Assessment & Plan:  HEALTH MAINTENANCE.  Keep on track with her physicals and mammograms.

## 2013-06-20 NOTE — Assessment & Plan Note (Signed)
MRI as outlined.  Back pain.  Conservative measures.  Refer to physical therapy.  Also note given to avoid heavy lifting and pulling to avoid flares with her back.  Follow.

## 2013-07-12 ENCOUNTER — Other Ambulatory Visit (INDEPENDENT_AMBULATORY_CARE_PROVIDER_SITE_OTHER): Payer: 59

## 2013-07-12 DIAGNOSIS — E785 Hyperlipidemia, unspecified: Secondary | ICD-10-CM

## 2013-07-12 DIAGNOSIS — I10 Essential (primary) hypertension: Secondary | ICD-10-CM

## 2013-07-12 DIAGNOSIS — M549 Dorsalgia, unspecified: Secondary | ICD-10-CM

## 2013-07-12 LAB — LIPID PANEL
Cholesterol: 188 mg/dL (ref 0–200)
HDL: 60.1 mg/dL (ref >39.00)
Triglycerides: 99 mg/dL (ref 0.0–149.0)

## 2013-07-12 LAB — HEPATIC FUNCTION PANEL
Albumin: 4.1 g/dL (ref 3.5–5.2)
Total Protein: 7.2 g/dL (ref 6.0–8.3)

## 2013-07-12 LAB — CBC WITH DIFFERENTIAL/PLATELET
Basophils Relative: 0.3 % (ref 0.0–3.0)
Eosinophils Absolute: 0.2 10*3/uL (ref 0.0–0.7)
Eosinophils Relative: 3.1 % (ref 0.0–5.0)
Lymphocytes Relative: 36.1 % (ref 12.0–46.0)
MCHC: 34.2 g/dL (ref 30.0–36.0)
Neutrophils Relative %: 54.5 % (ref 43.0–77.0)
Platelets: 219 10*3/uL (ref 150.0–400.0)
RBC: 4.18 Mil/uL (ref 3.87–5.11)
WBC: 7.9 10*3/uL (ref 4.5–10.5)

## 2013-07-12 LAB — BASIC METABOLIC PANEL
CO2: 24 mEq/L (ref 19–32)
Calcium: 9.2 mg/dL (ref 8.4–10.5)
Creatinine, Ser: 0.7 mg/dL (ref 0.4–1.2)
Glucose, Bld: 109 mg/dL — ABNORMAL HIGH (ref 70–99)

## 2013-07-13 ENCOUNTER — Telehealth: Payer: Self-pay | Admitting: Internal Medicine

## 2013-07-13 ENCOUNTER — Encounter: Payer: Self-pay | Admitting: Internal Medicine

## 2013-07-13 DIAGNOSIS — R739 Hyperglycemia, unspecified: Secondary | ICD-10-CM

## 2013-07-13 NOTE — Telephone Encounter (Signed)
Pt notified of lab results via my chart.  Needs a follow up lab appointment (fasting) in 3-4 weeks.  Please contact pt with an appt date and time.   Thanks.

## 2013-07-13 NOTE — Telephone Encounter (Signed)
Left message for pt to call office

## 2013-07-16 NOTE — Telephone Encounter (Signed)
Left message on cell phone for pt to call office °

## 2013-07-19 ENCOUNTER — Other Ambulatory Visit: Payer: 59

## 2013-07-20 NOTE — Telephone Encounter (Signed)
appointmetn made for 10/1

## 2013-07-21 ENCOUNTER — Other Ambulatory Visit: Payer: 59

## 2013-07-29 ENCOUNTER — Ambulatory Visit: Payer: 59 | Admitting: Internal Medicine

## 2013-08-19 ENCOUNTER — Ambulatory Visit (INDEPENDENT_AMBULATORY_CARE_PROVIDER_SITE_OTHER): Payer: 59 | Admitting: Internal Medicine

## 2013-08-19 ENCOUNTER — Encounter: Payer: Self-pay | Admitting: Internal Medicine

## 2013-08-19 VITALS — BP 130/80 | HR 81 | Temp 98.4°F | Ht 70.5 in | Wt 242.2 lb

## 2013-08-19 DIAGNOSIS — Z7189 Other specified counseling: Secondary | ICD-10-CM

## 2013-08-19 DIAGNOSIS — M549 Dorsalgia, unspecified: Secondary | ICD-10-CM

## 2013-08-19 DIAGNOSIS — E785 Hyperlipidemia, unspecified: Secondary | ICD-10-CM

## 2013-08-19 DIAGNOSIS — R739 Hyperglycemia, unspecified: Secondary | ICD-10-CM

## 2013-08-19 DIAGNOSIS — Z716 Tobacco abuse counseling: Secondary | ICD-10-CM

## 2013-08-19 DIAGNOSIS — Z1239 Encounter for other screening for malignant neoplasm of breast: Secondary | ICD-10-CM

## 2013-08-19 DIAGNOSIS — I10 Essential (primary) hypertension: Secondary | ICD-10-CM

## 2013-08-19 DIAGNOSIS — R7309 Other abnormal glucose: Secondary | ICD-10-CM

## 2013-08-22 ENCOUNTER — Encounter: Payer: Self-pay | Admitting: Internal Medicine

## 2013-08-22 DIAGNOSIS — R739 Hyperglycemia, unspecified: Secondary | ICD-10-CM | POA: Insufficient documentation

## 2013-08-22 NOTE — Assessment & Plan Note (Signed)
MRI as outlined.  Back pain.  Conservative measures.  Referred to physical therapy.  Helped.  Continue exercises at home.  Follow.

## 2013-08-22 NOTE — Assessment & Plan Note (Signed)
Continue simvastatin.  Check lipid panel and liver function.    

## 2013-08-22 NOTE — Assessment & Plan Note (Signed)
BP Readings from Last 3 Encounters:  08/19/13 130/80  06/15/13 122/80  01/25/13 120/82   BP well controlled on HCTZ. Will continue.  Follow metabolic panel.

## 2013-08-22 NOTE — Assessment & Plan Note (Signed)
Had her mammogram 05/26/13.  Recommended f/u views.  Need results.  States checked out fine.

## 2013-08-22 NOTE — Progress Notes (Signed)
  Subjective:    [name], female    DOB: Oct 25, 1965, 47 y.o.   MRN: [id number]  HPI 47 year old female with past history of hypertension, hypercholesterolemia and back pain with MRI revealing severe bilateral facet arthritis with spondylolisthesis and small disc protrusion.  She comes in today for a scheduled follow up.  I referred her to physical therapy for her back.  Helped.  Planning to do exercises at home.  Still smoking.  We discussed the need to stop.   Eating and drinking well.  Breathing stable.  Overall she feels she is doing well.      Past Medical History  Diagnosis Date  . Hypertension   . Hyperlipidemia     Outpatient Encounter Prescriptions as of 08/19/2013  Medication Sig  . Cholecalciferol (VITAMIN D-3) 1000 UNITS CAPS Take 1,000 Units by mouth daily.  . hydrochlorothiazide (HYDRODIURIL) 25 MG tablet TAKE 1 TABLET BY MOUTH ONCE DAILY  . ibuprofen (ADVIL,MOTRIN) 200 MG tablet Take 200 mg by mouth every 6 (six) hours as needed.  . Multiple Vitamins-Calcium (ONE-A-DAY WOMENS FORMULA PO) Take by mouth.  . simvastatin (ZOCOR) 10 MG tablet TAKE 1 TABLET BY MOUTH AT BEDTIME.  . vitamin B-12 (CYANOCOBALAMIN) 100 MCG tablet Take 50 mcg by mouth daily.    Review of Systems Patient denies any headache, lightheadedness or dizziness.  No sinus or allergy symptoms.  No chest pain, tightness or palpitations.  No increased shortness of breath, cough or congestion.  No nausea or vomiting.  No acid reflux.  No abdominal pain or cramping.  No bowel change, such as diarrhea, constipation, BRBPR or melana.  No urine change.  Back pain is better.  Therapy helped.  Needs to avoid heavy lifting and pulling.  Will do back exercises at home.  Continues to smoke. Discussed the need to quit.       Objective:   Physical Exam  Filed Vitals:   08/19/13 1522  BP: 130/80  Pulse: 81  Temp: 98.4 F (75.71 C)   47 year old female in no acute distress.   HEENT:  Nares- clear.   Oropharynx - without lesions. NECK:  Supple.  Nontender.  No audible bruit.  HEART:  Appears to be regular. LUNGS:  No crackles or wheezing audible.  Respirations even and unlabored.  RADIAL PULSE:  Equal bilaterally.  ABDOMEN:  Soft, nontender.  Bowel sounds present and normal.  No audible abdominal bruit.    EXTREMITIES:  No increased edema present.  DP pulses palpable and equal bilaterally.             Assessment & Plan:  HEALTH MAINTENANCE.  Keep on track with her physicals and mammograms.

## 2013-08-22 NOTE — Assessment & Plan Note (Signed)
Have discussed the need to quit.  Follow.  

## 2013-08-22 NOTE — Assessment & Plan Note (Signed)
Check fasting glucose and a1c.  Low carb diet.  Follow.   

## 2013-08-23 ENCOUNTER — Encounter: Payer: Self-pay | Admitting: Internal Medicine

## 2013-08-31 ENCOUNTER — Other Ambulatory Visit (INDEPENDENT_AMBULATORY_CARE_PROVIDER_SITE_OTHER): Payer: 59

## 2013-08-31 ENCOUNTER — Encounter: Payer: Self-pay | Admitting: Internal Medicine

## 2013-08-31 DIAGNOSIS — R7309 Other abnormal glucose: Secondary | ICD-10-CM

## 2013-08-31 DIAGNOSIS — R739 Hyperglycemia, unspecified: Secondary | ICD-10-CM

## 2013-08-31 LAB — HEMOGLOBIN A1C: Hgb A1c MFr Bld: 5.9 % (ref 4.6–6.5)

## 2013-09-01 ENCOUNTER — Encounter: Payer: Self-pay | Admitting: Internal Medicine

## 2013-09-03 ENCOUNTER — Encounter: Payer: Self-pay | Admitting: Internal Medicine

## 2013-09-03 ENCOUNTER — Ambulatory Visit (INDEPENDENT_AMBULATORY_CARE_PROVIDER_SITE_OTHER): Payer: 59 | Admitting: Internal Medicine

## 2013-09-03 VITALS — BP 120/70 | HR 83 | Temp 98.3°F | Ht 70.5 in | Wt 244.0 lb

## 2013-09-03 DIAGNOSIS — R21 Rash and other nonspecific skin eruption: Secondary | ICD-10-CM

## 2013-09-03 NOTE — Progress Notes (Signed)
  Subjective:    [name], female    DOB: Mar 29, 1966, 47 y.o.   MRN: [id number]  Rash  47 year old female with past history of hypertension, hypercholesterolemia and back pain with MRI revealing severe bilateral facet arthritis with spondylolisthesis and small disc protrusion.  She comes in today as a work in with concerns regarding a rash.  States she has had some issues previously - with her face.  Uses metrogel prn.  Recently has noticed a rash around her mouth and under her chin.  Itches especially when hot.  She has been applying listerine on the area.  Is dry now.   No fever.  No sore throat or sinus congestion.  Other than the rash, she feels good.       Past Medical History  Diagnosis Date  . Hypertension   . Hyperlipidemia     Outpatient Encounter Prescriptions as of 09/03/2013  Medication Sig  . Cholecalciferol (VITAMIN D-3) 1000 UNITS CAPS Take 1,000 Units by mouth daily.  . hydrochlorothiazide (HYDRODIURIL) 25 MG tablet TAKE 1 TABLET BY MOUTH ONCE DAILY  . ibuprofen (ADVIL,MOTRIN) 200 MG tablet Take 200 mg by mouth every 6 (six) hours as needed.  . Multiple Vitamins-Calcium (ONE-A-DAY WOMENS FORMULA PO) Take by mouth.  . simvastatin (ZOCOR) 10 MG tablet TAKE 1 TABLET BY MOUTH AT BEDTIME.  . vitamin B-12 (CYANOCOBALAMIN) 100 MCG tablet Take 50 mcg by mouth daily.    Review of Systems Patient denies any headache.  No sinus or allergy symptoms.  Rash as outlined.  No new contacts prior to the rash starting.  specifically She has been applying cocoa butter and has been using Listarine to clean area.  No fever.       Objective:   Physical Exam  Filed Vitals:   09/03/13 0802  BP: 120/70  Pulse: 83  Temp: 98.3 F (70.80 C)   47 year old female in no acute distress.   HEENT:  Nares- clear.  Oropharynx - without lesions. NECK:  Supple.  Nontender.  No audible bruit.  SKIN:  Raised bumps around the mouth and under her chin.  Dry skin.   HEART:  Appears  to be regular. LUNGS:  No crackles or wheezing audible.  Respirations even and unlabored.             Assessment & Plan:  HEALTH MAINTENANCE.  Keep on track with her physicals and mammograms.

## 2013-09-03 NOTE — Progress Notes (Signed)
Pre-visit discussion using our clinic review tool. No additional management support is needed unless otherwise documented below in the visit note.  

## 2013-09-03 NOTE — Patient Instructions (Addendum)
Saline nasal spray - flush nose at least 2-3x/day.  Flonase nasal spray - 2 sprays each nostril one time per day.  (do this in the evening).  Robitussin DM as directed.    1% hydrocortisone cream - apply one time per day to affected area.

## 2013-09-05 ENCOUNTER — Encounter: Payer: Self-pay | Admitting: Internal Medicine

## 2013-09-05 DIAGNOSIS — R21 Rash and other nonspecific skin eruption: Secondary | ICD-10-CM | POA: Insufficient documentation

## 2013-09-05 NOTE — Assessment & Plan Note (Signed)
Localized to her face - around her mouth and under her chin.  Appears to be more consistent with a possible contact dermatitis.   Itches.  Have her stop the listarine and all otc medications.  Leave everything off for the next 1-2 days then can apply 1% hydrocortisone cream to the affected area.  (Gentle use - only once per day.  Do not use for more than one week.).  She is to call next week with an update.

## 2013-09-10 ENCOUNTER — Encounter: Payer: Self-pay | Admitting: Internal Medicine

## 2013-09-22 ENCOUNTER — Telehealth: Payer: Self-pay | Admitting: Internal Medicine

## 2013-09-22 ENCOUNTER — Encounter: Payer: Self-pay | Admitting: Internal Medicine

## 2013-09-22 NOTE — Telephone Encounter (Signed)
Pt needing note for work regarding limited activity due to back.  States she is training to work at [employer].  States note has been provided in the past and we should have it on file.  States she needs to speak with Dr. Lorin Picket.

## 2013-09-22 NOTE — Telephone Encounter (Signed)
Please advise 

## 2013-09-22 NOTE — Telephone Encounter (Signed)
I printed a letter.  Signed and placed in your box.  Let me know if she still needs to talk to me or any problems.

## 2013-09-22 NOTE — Telephone Encounter (Signed)
Pt notified & requested that note be faxed to: [phone] Attn: Dr. Dorothey Baseman (faxed)

## 2013-10-25 ENCOUNTER — Other Ambulatory Visit: Payer: Self-pay | Admitting: Internal Medicine

## 2013-12-07 ENCOUNTER — Encounter: Payer: 59 | Admitting: Internal Medicine

## 2014-04-07 ENCOUNTER — Telehealth: Payer: Self-pay | Admitting: Internal Medicine

## 2014-04-07 MED ORDER — SIMVASTATIN 10 MG PO TABS: ORAL_TABLET | ORAL | Status: DC

## 2014-04-07 NOTE — Telephone Encounter (Signed)
simvastatin (ZOCOR) 10 MG tablet  1 time refill to [facility]

## 2014-04-27 ENCOUNTER — Ambulatory Visit (INDEPENDENT_AMBULATORY_CARE_PROVIDER_SITE_OTHER): Payer: 59 | Admitting: Internal Medicine

## 2014-04-27 ENCOUNTER — Encounter: Payer: Self-pay | Admitting: Internal Medicine

## 2014-04-27 VITALS — BP 110/80 | HR 70 | Temp 98.2°F | Ht 71.0 in | Wt 230.2 lb

## 2014-04-27 DIAGNOSIS — M545 Low back pain, unspecified: Secondary | ICD-10-CM

## 2014-04-27 DIAGNOSIS — Z733 Stress, not elsewhere classified: Secondary | ICD-10-CM

## 2014-04-27 DIAGNOSIS — I10 Essential (primary) hypertension: Secondary | ICD-10-CM

## 2014-04-27 DIAGNOSIS — R7309 Other abnormal glucose: Secondary | ICD-10-CM

## 2014-04-27 DIAGNOSIS — E7800 Pure hypercholesterolemia, unspecified: Secondary | ICD-10-CM

## 2014-04-27 DIAGNOSIS — Z7189 Other specified counseling: Secondary | ICD-10-CM

## 2014-04-27 DIAGNOSIS — R739 Hyperglycemia, unspecified: Secondary | ICD-10-CM

## 2014-04-27 DIAGNOSIS — F17200 Nicotine dependence, unspecified, uncomplicated: Secondary | ICD-10-CM

## 2014-04-27 DIAGNOSIS — F439 Reaction to severe stress, unspecified: Secondary | ICD-10-CM

## 2014-04-27 DIAGNOSIS — E785 Hyperlipidemia, unspecified: Secondary | ICD-10-CM

## 2014-04-27 DIAGNOSIS — Z1239 Encounter for other screening for malignant neoplasm of breast: Secondary | ICD-10-CM

## 2014-04-27 DIAGNOSIS — Z716 Tobacco abuse counseling: Secondary | ICD-10-CM

## 2014-04-27 LAB — CBC WITH DIFFERENTIAL/PLATELET
Basophils Absolute: 0 10*3/uL (ref 0.0–0.1)
Basophils Relative: 0.3 % (ref 0.0–3.0)
EOS ABS: 0.1 10*3/uL (ref 0.0–0.7)
Eosinophils Relative: 1.3 % (ref 0.0–5.0)
HCT: 41.9 % (ref 36.0–46.0)
Hemoglobin: 13.9 g/dL (ref 12.0–15.0)
LYMPHS PCT: 29.6 % (ref 12.0–46.0)
Lymphs Abs: 2 10*3/uL (ref 0.7–4.0)
MCHC: 33.3 g/dL (ref 30.0–36.0)
MCV: 94.6 fl (ref 78.0–100.0)
MONOS PCT: 6.2 % (ref 3.0–12.0)
Monocytes Absolute: 0.4 10*3/uL (ref 0.1–1.0)
NEUTROS ABS: 4.2 10*3/uL (ref 1.4–7.7)
NEUTROS PCT: 62.6 % (ref 43.0–77.0)
Platelets: 228 10*3/uL (ref 150.0–400.0)
RBC: 4.43 Mil/uL (ref 3.87–5.11)
RDW: 13.8 % (ref 11.5–15.5)
WBC: 6.6 10*3/uL (ref 4.0–10.5)

## 2014-04-27 LAB — HEMOGLOBIN A1C: Hgb A1c MFr Bld: 6 % (ref 4.6–6.5)

## 2014-04-27 LAB — TSH: TSH: 0.36 u[IU]/mL (ref 0.35–4.50)

## 2014-04-27 NOTE — Progress Notes (Signed)
Pre visit review using our clinic review tool, if applicable. No additional management support is needed unless otherwise documented below in the visit note. 

## 2014-04-28 LAB — HEPATIC FUNCTION PANEL
ALBUMIN: 4 g/dL (ref 3.5–5.2)
ALK PHOS: 56 U/L (ref 39–117)
ALT: 27 U/L (ref 0–35)
AST: 25 U/L (ref 0–37)
Bilirubin, Direct: 0.1 mg/dL (ref 0.0–0.3)
TOTAL PROTEIN: 7.2 g/dL (ref 6.0–8.3)
Total Bilirubin: 0.6 mg/dL (ref 0.2–1.2)

## 2014-04-28 LAB — BASIC METABOLIC PANEL
BUN: 10 mg/dL (ref 6–23)
CHLORIDE: 107 meq/L (ref 96–112)
CO2: 25 mEq/L (ref 19–32)
Calcium: 9.4 mg/dL (ref 8.4–10.5)
Creatinine, Ser: 0.7 mg/dL (ref 0.4–1.2)
GFR: 121.34 mL/min (ref >60.00)
Glucose, Bld: 101 mg/dL — ABNORMAL HIGH (ref 70–99)
POTASSIUM: 4.5 meq/L (ref 3.5–5.1)
SODIUM: 139 meq/L (ref 135–145)

## 2014-04-28 LAB — LIPID PANEL
Cholesterol: 168 mg/dL (ref 0–200)
HDL: 61 mg/dL (ref >39.00)
LDL Cholesterol: 98 mg/dL (ref 0–99)
NONHDL: 107
TRIGLYCERIDES: 45 mg/dL (ref 0.0–149.0)
Total CHOL/HDL Ratio: 3
VLDL: 9 mg/dL (ref 0.0–40.0)

## 2014-04-29 ENCOUNTER — Encounter: Payer: Self-pay | Admitting: Internal Medicine

## 2014-05-01 ENCOUNTER — Encounter: Payer: Self-pay | Admitting: Internal Medicine

## 2014-05-01 DIAGNOSIS — F439 Reaction to severe stress, unspecified: Secondary | ICD-10-CM | POA: Insufficient documentation

## 2014-05-01 NOTE — Assessment & Plan Note (Signed)
Again discussed the need to quit.  Follow.

## 2014-05-01 NOTE — Assessment & Plan Note (Signed)
Continue simvastatin.  Check lipid panel and liver function.

## 2014-05-01 NOTE — Progress Notes (Signed)
  Subjective:    [name], female    DOB: 02-12-1966, 48 y.o.   MRN: [id number]  HPI 48 year old female with past history of hypertension, hypercholesterolemia and back pain with MRI revealing severe bilateral facet arthritis with spondylolisthesis and small disc protrusion.  She comes in today to follow up on these issues as well as for a complete physical exam.  Still smoking.  We discussed the need to stop.   Breathing stable.  Overall she feels she is doing well.  Some increased stress.  Taking care of her grandchildren.  Feels she is coping relatively well.  Does not feel she needs anything more.  Has lost weight.  States she is a lot more active.  Is eating and drinking well.      Past Medical History  Diagnosis Date  . Hypertension   . Hyperlipidemia     Outpatient Encounter Prescriptions as of 04/27/2014  Medication Sig  . Cholecalciferol (VITAMIN D-3) 1000 UNITS CAPS Take 1,000 Units by mouth daily.  . hydrochlorothiazide (HYDRODIURIL) 25 MG tablet TAKE 1 TABLET BY MOUTH ONCE DAILY  . ibuprofen (ADVIL,MOTRIN) 200 MG tablet Take 200 mg by mouth every 6 (six) hours as needed.  . Multiple Vitamins-Calcium (ONE-A-DAY WOMENS FORMULA PO) Take by mouth.  . simvastatin (ZOCOR) 10 MG tablet TAKE 1 TABLET BY MOUTH AT BEDTIME.  . vitamin B-12 (CYANOCOBALAMIN) 100 MCG tablet Take 50 mcg by mouth daily.    Review of Systems Patient denies any headache, lightheadedness or dizziness.  No sinus or allergy symptoms.  No chest pain, tightness or palpitations.  No increased shortness of breath, cough or congestion.  No nausea or vomiting.  No acid reflux.  No abdominal pain or cramping.  No bowel change, such as diarrhea, constipation, BRBPR or melana.  No urine change.  Continues to smoke. Discussed the need to quit.  Increased stress.  Feels she is coping relatively well.  Has good support.       Objective:   Physical Exam  Filed Vitals:   04/27/14 1022  BP: 110/80   Pulse: 70  Temp: 98.2 F (36.8 C)   Blood pressure recheck:  47/58  48 year old female in no acute distress.   HEENT:  Nares- clear.  Oropharynx - without lesions. NECK:  Supple.  Nontender.  No audible bruit.  HEART:  Appears to be regular. LUNGS:  No crackles or wheezing audible.  Respirations even and unlabored.  RADIAL PULSE:  Equal bilaterally.    BREASTS:  No nipple discharge or nipple retraction present.  Could not appreciate any distinct nodules or axillary adenopathy.  ABDOMEN:  Soft, nontender.  Bowel sounds present and normal.  No audible abdominal bruit.  GU:  Not performed.     EXTREMITIES:  No increased edema present.  DP pulses palpable and equal bilaterally.           Assessment & Plan:  HEALTH MAINTENANCE.  Physical today.  Mammogram 06/16/13 - [facility] I.  She wanted to hold on pap today.

## 2014-05-01 NOTE — Assessment & Plan Note (Signed)
MRI as outlined.  Back pain.  Conservative measures.  Referred to physical therapy.  Helped.  Continue exercises at home.  Follow.  Not reported as a significant issue today.

## 2014-05-01 NOTE — Assessment & Plan Note (Signed)
Increased stress as outlined.  Feels she is doing relatively well.  Follow.

## 2014-05-01 NOTE — Assessment & Plan Note (Signed)
BP Readings from Last 3 Encounters:  04/27/14 110/80  09/03/13 120/70  08/19/13 130/80   BP well controlled on HCTZ. Will continue.  Follow metabolic panel.

## 2014-05-01 NOTE — Assessment & Plan Note (Signed)
Check fasting glucose and a1c.  Low carb diet.  Follow.

## 2014-05-02 NOTE — Telephone Encounter (Signed)
Unread mychart message mailed to patient 

## 2014-05-03 ENCOUNTER — Other Ambulatory Visit: Payer: Self-pay | Admitting: Internal Medicine

## 2014-08-30 ENCOUNTER — Ambulatory Visit: Payer: 59 | Admitting: Internal Medicine

## 2014-09-30 ENCOUNTER — Ambulatory Visit (INDEPENDENT_AMBULATORY_CARE_PROVIDER_SITE_OTHER): Payer: 59 | Admitting: Internal Medicine

## 2014-09-30 ENCOUNTER — Encounter: Payer: Self-pay | Admitting: Internal Medicine

## 2014-09-30 VITALS — BP 130/90 | HR 71 | Temp 98.9°F | Ht 71.0 in | Wt 229.0 lb

## 2014-09-30 DIAGNOSIS — E7800 Pure hypercholesterolemia, unspecified: Secondary | ICD-10-CM

## 2014-09-30 DIAGNOSIS — M545 Low back pain: Secondary | ICD-10-CM

## 2014-09-30 DIAGNOSIS — I10 Essential (primary) hypertension: Secondary | ICD-10-CM

## 2014-09-30 DIAGNOSIS — Z658 Other specified problems related to psychosocial circumstances: Secondary | ICD-10-CM

## 2014-09-30 DIAGNOSIS — E669 Obesity, unspecified: Secondary | ICD-10-CM

## 2014-09-30 DIAGNOSIS — Z1239 Encounter for other screening for malignant neoplasm of breast: Secondary | ICD-10-CM

## 2014-09-30 DIAGNOSIS — R739 Hyperglycemia, unspecified: Secondary | ICD-10-CM

## 2014-09-30 DIAGNOSIS — E0789 Other specified disorders of thyroid: Secondary | ICD-10-CM

## 2014-09-30 DIAGNOSIS — F439 Reaction to severe stress, unspecified: Secondary | ICD-10-CM

## 2014-09-30 NOTE — Progress Notes (Signed)
Subjective:    [name], female    DOB: 03-Aug-1966, 48 y.o.   MRN: [id number]  HPI 48 year old female with past history of hypertension, hypercholesterolemia and back pain with MRI revealing severe bilateral facet arthritis with spondylolisthesis and small disc protrusion.  She comes in today for a scheduled follow up.  Still smoking.  We discussed the need to stop.   She plans to stop in 10/2014.  Breathing stable.  Overall she feels she is doing well.  Some increased stress.  Taking care of her grandchildren.  Feels she is coping relatively well.  Does not feel she needs anything more.   States she is a lot more active.  Is eating and drinking well.   Has recently had some chest congestion and cough.  Started vitamin C.  Is better.     Past Medical History  Diagnosis Date  . Hypertension   . Hyperlipidemia     Outpatient Encounter Prescriptions as of 09/30/2014  Medication Sig  . Cholecalciferol (VITAMIN D-3) 1000 UNITS CAPS Take 1,000 Units by mouth daily.  . hydrochlorothiazide (HYDRODIURIL) 25 MG tablet TAKE 1 TABLET BY MOUTH ONCE DAILY  . ibuprofen (ADVIL,MOTRIN) 200 MG tablet Take 200 mg by mouth every 6 (six) hours as needed.  . Multiple Vitamins-Calcium (ONE-A-DAY WOMENS FORMULA PO) Take by mouth.  . simvastatin (ZOCOR) 10 MG tablet TAKE 1 TABLET BY MOUTH AT BEDTIME.  . vitamin B-12 (CYANOCOBALAMIN) 100 MCG tablet Take 50 mcg by mouth daily.  . [DISCONTINUED] simvastatin (ZOCOR) 10 MG tablet TAKE 1 TABLET BY MOUTH AT BEDTIME.    Review of Systems Patient denies any headache, lightheadedness or dizziness.  No sinus or allergy symptoms.  No chest pain, tightness or palpitations.  No increased shortness of breath.  Cough and congestion better.  No nausea or vomiting.  No acid reflux.  No abdominal pain or cramping.  No bowel change, such as diarrhea, constipation, BRBPR or melana.  No urine change.  Continues to smoke. Discussed the need to quit.  see above.   Plans to stop in 10/2014.  Increased stress.  Feels she is coping relatively well.  Has good support.  Persistent skin lesions.       Objective:   Physical Exam  Filed Vitals:   09/30/14 0851  BP: 130/90  Pulse: 71  Temp: 98.9 F (37.2 C)   Blood pressure recheck: 14/40  48 year old female in no acute distress.   HEENT:  Nares- clear.  Oropharynx - without lesions. NECK:  Supple.  Nontender.  No audible bruit.  Increased fullness (right thyroid).   HEART:  Appears to be regular. LUNGS:  No crackles or wheezing audible.  Respirations even and unlabored.  RADIAL PULSE:  Equal bilaterally.  ABDOMEN:  Soft, nontender.  Bowel sounds present and normal.  No audible abdominal bruit.   EXTREMITIES:  No increased edema present.  DP pulses palpable and equal bilaterally.           Assessment & Plan:  1. Essential hypertension Blood pressure has been doing well.  Follow.  Follow met b.   2. Hyperglycemia Low carb diet and exercise.  Follow a1c.   3. Stress Increased stress as outlined.  See above.  Has good support.  Does not feel she needs anything more at this point.    4. Hypercholesterolemia Low cholesterol diet and exercise.  Follow lipid panel.    5. Breast cancer screening - MM DIGITAL SCREENING BILATERAL; Future  6. Thyroid fullness Has seen Dr Eddie Dibbles previously (2011).  Increased fullness on exam.  Refer back to endocrinology.  Follow thyroid function.  - Ambulatory referral to Endocrinology  7. Obesity (BMI 30-39.9) Diet and exercise.    8. Tobacco abuse.  Discussed the need to quit.  Plans to stop in 10/2014.    9. Low back pain without sciatica, unspecified back pain laterality Has had MRI.  Findings as outlined.  Stable.    HEALTH MAINTENANCE.  Physical 04/27/14.  Mammogram 06/16/13 - Birads I.  She wanted to hold on pap at physical.  Schedule mammogram.

## 2014-09-30 NOTE — Patient Instructions (Signed)
Robitussin twice a day as needed.

## 2014-09-30 NOTE — Progress Notes (Signed)
Pre visit review using our clinic review tool, if applicable. No additional management support is needed unless otherwise documented below in the visit note. 

## 2014-10-02 ENCOUNTER — Encounter: Payer: Self-pay | Admitting: Internal Medicine

## 2014-10-02 DIAGNOSIS — E669 Obesity, unspecified: Secondary | ICD-10-CM | POA: Insufficient documentation

## 2014-10-02 DIAGNOSIS — E0789 Other specified disorders of thyroid: Secondary | ICD-10-CM | POA: Insufficient documentation

## 2014-11-01 ENCOUNTER — Ambulatory Visit: Payer: Self-pay | Admitting: Internal Medicine

## 2014-11-01 LAB — HM MAMMOGRAPHY: HM Mammogram: NEGATIVE

## 2014-11-08 ENCOUNTER — Other Ambulatory Visit: Payer: Self-pay | Admitting: *Deleted

## 2014-11-08 MED ORDER — HYDROCHLOROTHIAZIDE 25 MG PO TABS
25.0000 mg | ORAL_TABLET | Freq: Every day | ORAL | Status: DC
Start: 2014-11-08 — End: 2014-12-16

## 2014-12-16 ENCOUNTER — Other Ambulatory Visit: Payer: Self-pay | Admitting: Internal Medicine

## 2015-02-02 ENCOUNTER — Encounter: Payer: Self-pay | Admitting: Internal Medicine

## 2015-02-02 ENCOUNTER — Ambulatory Visit (INDEPENDENT_AMBULATORY_CARE_PROVIDER_SITE_OTHER): Payer: 59 | Admitting: Internal Medicine

## 2015-02-02 VITALS — BP 118/72 | HR 78 | Temp 98.4°F | Ht 71.0 in | Wt 227.5 lb

## 2015-02-02 DIAGNOSIS — Z720 Tobacco use: Secondary | ICD-10-CM

## 2015-02-02 DIAGNOSIS — I10 Essential (primary) hypertension: Secondary | ICD-10-CM | POA: Diagnosis not present

## 2015-02-02 DIAGNOSIS — Z0000 Encounter for general adult medical examination without abnormal findings: Secondary | ICD-10-CM

## 2015-02-02 DIAGNOSIS — E669 Obesity, unspecified: Secondary | ICD-10-CM

## 2015-02-02 DIAGNOSIS — E0789 Other specified disorders of thyroid: Secondary | ICD-10-CM

## 2015-02-02 DIAGNOSIS — F439 Reaction to severe stress, unspecified: Secondary | ICD-10-CM

## 2015-02-02 DIAGNOSIS — Z716 Tobacco abuse counseling: Secondary | ICD-10-CM

## 2015-02-02 DIAGNOSIS — R739 Hyperglycemia, unspecified: Secondary | ICD-10-CM

## 2015-02-02 DIAGNOSIS — E7800 Pure hypercholesterolemia, unspecified: Secondary | ICD-10-CM

## 2015-02-02 DIAGNOSIS — Z658 Other specified problems related to psychosocial circumstances: Secondary | ICD-10-CM

## 2015-02-02 NOTE — Progress Notes (Signed)
[name], female   DOB: [birth year], 49 y.o.   MRN: [id number]   Subjective:    [name], female    DOB: July 10, 1966, 49 y.o.   MRN: [id number]  HPI  Patient here for a scheduled follow up.  Increased stress with her son's medical issues.  Recently hospitalized with strep infection.  In the hospital for over one month.  She is taking care of her grandchildren.  Increased stress with this.  Discussed with her today.  Trying to cut back on her smoking.  Has not been as successful with the increased stress with her son.     Past Medical History  Diagnosis Date  . Hypertension   . Hyperlipidemia     Current Outpatient Prescriptions on File Prior to Visit  Medication Sig Dispense Refill  . Cholecalciferol (VITAMIN D-3) 1000 UNITS CAPS Take 1,000 Units by mouth daily.    . hydrochlorothiazide (HYDRODIURIL) 25 MG tablet TAKE 1 TABLET BY MOUTH ONCE DAILY 90 tablet 1  . ibuprofen (ADVIL,MOTRIN) 200 MG tablet Take 200 mg by mouth every 6 (six) hours as needed.    . Multiple Vitamins-Calcium (ONE-A-DAY WOMENS FORMULA PO) Take by mouth.    . simvastatin (ZOCOR) 10 MG tablet TAKE 1 TABLET BY MOUTH AT BEDTIME. 90 tablet 3  . vitamin B-12 (CYANOCOBALAMIN) 100 MCG tablet Take 50 mcg by mouth daily.     No current facility-administered medications on file prior to visit.    Review of Systems  Constitutional: Negative for appetite change and unexpected weight change.  HENT: Negative for congestion and sinus pressure.   Respiratory: Negative for cough, chest tightness and shortness of breath.   Cardiovascular: Negative for chest pain, palpitations and leg swelling.  Gastrointestinal: Negative for nausea, vomiting, abdominal pain and diarrhea.  Genitourinary: Negative for dysuria and difficulty urinating.  Skin: Negative for color change and rash.  Neurological: Negative for dizziness, light-headedness and headaches.  Hematological: Negative for adenopathy.  Does not bruise/bleed easily.  Psychiatric/Behavioral:       Increased stress as outlined.         Objective:    Physical Exam  Constitutional: She appears well-developed and well-nourished. No distress.  HENT:  Nose: Nose normal.  Mouth/Throat: Oropharynx is clear and moist.  Neck: Neck supple. No thyromegaly present.  Cardiovascular: Normal rate and regular rhythm.   Pulmonary/Chest: Breath sounds normal. No respiratory distress. She has no wheezes.  Abdominal: Soft. Bowel sounds are normal. There is no tenderness.  Musculoskeletal: She exhibits no edema or tenderness.  Lymphadenopathy:    She has no cervical adenopathy.  Skin: No rash noted. No erythema.  Psychiatric: She has a normal mood and affect. Her behavior is normal.    BP 118/72 mmHg  Pulse 78  Temp(Src) 98.4 F (36.9 C) (Oral)  Ht $R'5\' 11"'xm$  (1.803 m)  Wt 227 lb 8 oz (103.193 kg)  BMI 31.74 kg/m2  SpO2 97% Wt Readings from Last 3 Encounters:  02/02/15 227 lb 8 oz (103.193 kg)  09/30/14 229 lb (103.874 kg)  04/27/14 230 lb 4 oz (104.441 kg)     Lab Results  Component Value Date   WBC 6.6 04/27/2014   HGB 13.9 04/27/2014   HCT 41.9 04/27/2014   PLT 228.0 04/27/2014   GLUCOSE 101* 04/27/2014   CHOL 168 04/27/2014   TRIG 45.0 04/27/2014   HDL 61.00 04/27/2014   LDLCALC 98 04/27/2014   ALT 27 04/27/2014   AST 25 04/27/2014   NA  139 04/27/2014   K 4.5 04/27/2014   CL 107 04/27/2014   CREATININE 0.7 04/27/2014   BUN 10 04/27/2014   CO2 25 04/27/2014   TSH 0.36 04/27/2014   HGBA1C 6.0 04/27/2014       Assessment & Plan:   Problem List Items Addressed This Visit    Encounter for smoking cessation counseling    Have discussed the need to quit.  Increased stress recently.  Plans to continue to cut down.        Health care maintenance    Physical 04/27/14.  Mammogram 11/01/14 - Birads I.  01/26/12 - pap ok.        Hypercholesterolemia    On simvastatin.  Low cholesterol diet and exercise.  Follow  lipid panel and liver function tests.       Hyperglycemia    Low carb diet and exercise.  Follow met b and a1c.   Lab Results  Component Value Date   HGBA1C 6.0 04/27/2014        Hypertension - Primary    Blood pressure doing well.  Same medication regimen.  Follow pressures.  Follow metabolic panel.        Obesity (BMI 30-39.9)    Have discussed diet and exercise.        Stress    Increased stress as outlined.  Discussed with her today.  Follow.  She desires no further intervention.        Thyroid fullness    Increased fullness on exam.  Previously saw Dr Eddie Dibbles 2011.  Has appt to f/u with her.   Check thyroid function tests.            Einar Pheasant, MD

## 2015-02-02 NOTE — Progress Notes (Signed)
Pre visit review using our clinic review tool, if applicable. No additional management support is needed unless otherwise documented below in the visit note. 

## 2015-02-06 ENCOUNTER — Encounter: Payer: Self-pay | Admitting: Internal Medicine

## 2015-02-06 DIAGNOSIS — Z0000 Encounter for general adult medical examination without abnormal findings: Secondary | ICD-10-CM | POA: Insufficient documentation

## 2015-02-06 NOTE — Assessment & Plan Note (Signed)
Physical 04/27/14.  Mammogram 11/01/14 - Birads I.  01/26/12 - pap ok.

## 2015-02-06 NOTE — Assessment & Plan Note (Signed)
On simvastatin.  Low cholesterol diet and exercise.  Follow lipid panel and liver function tests.   

## 2015-02-06 NOTE — Assessment & Plan Note (Signed)
Low carb diet and exercise.  Follow met b and a1c.   Lab Results  Component Value Date   HGBA1C 6.0 04/27/2014

## 2015-02-06 NOTE — Assessment & Plan Note (Signed)
Increased fullness on exam.  Previously saw Dr Eddie Dibbles 2011.  Has appt to f/u with her.   Check thyroid function tests.

## 2015-02-06 NOTE — Assessment & Plan Note (Signed)
Blood pressure doing well.  Same medication regimen.  Follow pressures.  Follow metabolic panel.   

## 2015-02-06 NOTE — Assessment & Plan Note (Signed)
Have discussed the need to quit.  Increased stress recently.  Plans to continue to cut down.

## 2015-02-06 NOTE — Assessment & Plan Note (Signed)
Have discussed diet and exercise.   

## 2015-02-06 NOTE — Assessment & Plan Note (Signed)
Increased stress as outlined.  Discussed with her today.  Follow.  She desires no further intervention.

## 2015-02-14 ENCOUNTER — Other Ambulatory Visit (INDEPENDENT_AMBULATORY_CARE_PROVIDER_SITE_OTHER): Payer: 59

## 2015-02-14 DIAGNOSIS — E0789 Other specified disorders of thyroid: Secondary | ICD-10-CM | POA: Diagnosis not present

## 2015-02-14 DIAGNOSIS — R739 Hyperglycemia, unspecified: Secondary | ICD-10-CM

## 2015-02-14 DIAGNOSIS — E7800 Pure hypercholesterolemia, unspecified: Secondary | ICD-10-CM

## 2015-02-14 DIAGNOSIS — I10 Essential (primary) hypertension: Secondary | ICD-10-CM

## 2015-02-14 LAB — BASIC METABOLIC PANEL
BUN: 10 mg/dL (ref 6–23)
CO2: 28 meq/L (ref 19–32)
CREATININE: 0.76 mg/dL (ref 0.40–1.20)
Calcium: 10 mg/dL (ref 8.4–10.5)
Chloride: 103 mEq/L (ref 96–112)
GFR: 104.55 mL/min (ref >60.00)
Glucose, Bld: 98 mg/dL (ref 70–99)
Potassium: 4.4 mEq/L (ref 3.5–5.1)
Sodium: 139 mEq/L (ref 135–145)

## 2015-02-14 LAB — HEPATIC FUNCTION PANEL
ALBUMIN: 4.6 g/dL (ref 3.5–5.2)
ALT: 26 U/L (ref 0–35)
AST: 23 U/L (ref 0–37)
Alkaline Phosphatase: 63 U/L (ref 39–117)
BILIRUBIN TOTAL: 0.6 mg/dL (ref 0.2–1.2)
Bilirubin, Direct: 0.1 mg/dL (ref 0.0–0.3)
Total Protein: 7.1 g/dL (ref 6.0–8.3)

## 2015-02-14 LAB — TSH: TSH: 0.45 u[IU]/mL (ref 0.35–4.50)

## 2015-02-14 LAB — LIPID PANEL
CHOL/HDL RATIO: 3
Cholesterol: 215 mg/dL — ABNORMAL HIGH (ref 0–200)
HDL: 72.2 mg/dL (ref >39.00)
LDL Cholesterol: 131 mg/dL — ABNORMAL HIGH (ref 0–99)
NonHDL: 142.8
Triglycerides: 59 mg/dL (ref 0.0–149.0)
VLDL: 11.8 mg/dL (ref 0.0–40.0)

## 2015-02-14 LAB — HEMOGLOBIN A1C: Hgb A1c MFr Bld: 5.7 % (ref 4.6–6.5)

## 2015-02-14 LAB — T4, FREE: Free T4: 0.87 ng/dL (ref 0.60–1.60)

## 2015-02-14 LAB — T3, FREE: T3, Free: 3.3 pg/mL (ref 2.3–4.2)

## 2015-02-15 ENCOUNTER — Encounter: Payer: Self-pay | Admitting: Internal Medicine

## 2015-02-16 ENCOUNTER — Other Ambulatory Visit: Payer: 59

## 2015-02-21 NOTE — Telephone Encounter (Signed)
Unread mychart message mailed to patient 

## 2015-03-07 LAB — HM COLONOSCOPY

## 2015-06-06 ENCOUNTER — Other Ambulatory Visit: Payer: Self-pay | Admitting: Internal Medicine

## 2015-06-15 ENCOUNTER — Ambulatory Visit (INDEPENDENT_AMBULATORY_CARE_PROVIDER_SITE_OTHER): Payer: 59 | Admitting: Internal Medicine

## 2015-06-15 ENCOUNTER — Encounter: Payer: Self-pay | Admitting: Internal Medicine

## 2015-06-15 VITALS — BP 126/80 | HR 64 | Temp 98.4°F | Ht 70.75 in | Wt 241.5 lb

## 2015-06-15 DIAGNOSIS — E0789 Other specified disorders of thyroid: Secondary | ICD-10-CM | POA: Diagnosis not present

## 2015-06-15 DIAGNOSIS — E669 Obesity, unspecified: Secondary | ICD-10-CM

## 2015-06-15 DIAGNOSIS — F439 Reaction to severe stress, unspecified: Secondary | ICD-10-CM

## 2015-06-15 DIAGNOSIS — Z716 Tobacco abuse counseling: Secondary | ICD-10-CM

## 2015-06-15 DIAGNOSIS — Z0000 Encounter for general adult medical examination without abnormal findings: Secondary | ICD-10-CM

## 2015-06-15 DIAGNOSIS — R739 Hyperglycemia, unspecified: Secondary | ICD-10-CM

## 2015-06-15 DIAGNOSIS — E7800 Pure hypercholesterolemia, unspecified: Secondary | ICD-10-CM

## 2015-06-15 DIAGNOSIS — I10 Essential (primary) hypertension: Secondary | ICD-10-CM

## 2015-06-15 DIAGNOSIS — Z658 Other specified problems related to psychosocial circumstances: Secondary | ICD-10-CM

## 2015-06-15 DIAGNOSIS — Z720 Tobacco use: Secondary | ICD-10-CM

## 2015-06-15 LAB — LIPID PANEL
CHOL/HDL RATIO: 3
Cholesterol: 195 mg/dL (ref 0–200)
HDL: 69.6 mg/dL (ref >39.00)
LDL CALC: 110 mg/dL — AB (ref 0–99)
NONHDL: 125.4
TRIGLYCERIDES: 77 mg/dL (ref 0.0–149.0)
VLDL: 15.4 mg/dL (ref 0.0–40.0)

## 2015-06-15 LAB — HEPATIC FUNCTION PANEL
ALBUMIN: 4.2 g/dL (ref 3.5–5.2)
ALK PHOS: 56 U/L (ref 39–117)
ALT: 25 U/L (ref 0–35)
AST: 22 U/L (ref 0–37)
BILIRUBIN DIRECT: 0.1 mg/dL (ref 0.0–0.3)
Total Bilirubin: 0.5 mg/dL (ref 0.2–1.2)
Total Protein: 6.8 g/dL (ref 6.0–8.3)

## 2015-06-15 LAB — CBC WITH DIFFERENTIAL/PLATELET
BASOS ABS: 0 10*3/uL (ref 0.0–0.1)
Basophils Relative: 0.4 % (ref 0.0–3.0)
EOS PCT: 3.9 % (ref 0.0–5.0)
Eosinophils Absolute: 0.3 10*3/uL (ref 0.0–0.7)
HEMATOCRIT: 41.2 % (ref 36.0–46.0)
HEMOGLOBIN: 14.1 g/dL (ref 12.0–15.0)
LYMPHS ABS: 2 10*3/uL (ref 0.7–4.0)
LYMPHS PCT: 30.8 % (ref 12.0–46.0)
MCHC: 34.2 g/dL (ref 30.0–36.0)
MCV: 93.3 fl (ref 78.0–100.0)
MONOS PCT: 7 % (ref 3.0–12.0)
Monocytes Absolute: 0.5 10*3/uL (ref 0.1–1.0)
NEUTROS PCT: 57.9 % (ref 43.0–77.0)
Neutro Abs: 3.8 10*3/uL (ref 1.4–7.7)
Platelets: 247 10*3/uL (ref 150.0–400.0)
RBC: 4.42 Mil/uL (ref 3.87–5.11)
RDW: 13.5 % (ref 11.5–15.5)
WBC: 6.6 10*3/uL (ref 4.0–10.5)

## 2015-06-15 LAB — TSH: TSH: 0.56 u[IU]/mL (ref 0.35–4.50)

## 2015-06-15 LAB — BASIC METABOLIC PANEL
BUN: 11 mg/dL (ref 6–23)
CHLORIDE: 104 meq/L (ref 96–112)
CO2: 25 meq/L (ref 19–32)
CREATININE: 0.69 mg/dL (ref 0.40–1.20)
Calcium: 9.3 mg/dL (ref 8.4–10.5)
GFR: 116.72 mL/min (ref >60.00)
Glucose, Bld: 84 mg/dL (ref 70–99)
POTASSIUM: 4.2 meq/L (ref 3.5–5.1)
Sodium: 140 mEq/L (ref 135–145)

## 2015-06-15 LAB — HEMOGLOBIN A1C: HEMOGLOBIN A1C: 5.7 % (ref 4.6–6.5)

## 2015-06-15 MED ORDER — METRONIDAZOLE 0.75 % EX GEL: Freq: Two times a day (BID) | CUTANEOUS | Status: DC

## 2015-06-15 NOTE — Progress Notes (Signed)
Pre-visit discussion using our clinic review tool. No additional management support is needed unless otherwise documented below in the visit note.  

## 2015-06-15 NOTE — Progress Notes (Signed)
[name], female   DOB: 1966-08-16, 49 y.o.   MRN: [id number]   Subjective:    [name], female    DOB: November 28, 1965, 49 y.o.   MRN: [id number]  HPI  Patient here to follow up on her current medical issues as well as for exam.  She is walking.  Not exercising as much.  Plans to restart when her grandchildren are back in school.  She is primary caretaker of her grandchildren.  Increased stress related to this.  Discussed with her today.  She has support from her family.  Desires no further intervention at this time.  No cardiac symptoms with increased activity or exertion.  No sob.  No acid reflux.  Bowels stable.  Does report some increased fatigue.     Past Medical History  Diagnosis Date  . Hypertension   . Hyperlipidemia    Past Surgical History  Procedure Laterality Date  . Vaginal delivery  1989    episiotomy, Dr. Arsenio Loader   Family History  Problem Relation Age of Onset  . Diabetes Mother   . Dementia Mother   . Stroke Father 70  . Hypertension Sister   . Thyroid disease Sister   . Diabetes Sister   . Hypertension Brother   . Diabetes Brother   . Hypertension Sister   . Hypertension Brother   . Hypertension Brother   . Hypertension Brother   . Hypertension Sister    Social History   Social History  . Marital Status: Single    Spouse Name: N/A  . Number of Children: N/A  . Years of Education: N/A   Social History Main Topics  . Smoking status: Current Some Day Smoker    Types: Cigarettes  . Smokeless tobacco: Never Used  . Alcohol Use: 0.0 oz/week    0 Standard drinks or equivalent per week  . Drug Use: No  . Sexual Activity: Not Asked   Other Topics Concern  . None   Social History Narrative   Lives in [place]. 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      Diet: regular diet, limits fried food   Exercise: walks goal 70mn daily    Outpatient Encounter Prescriptions as of 06/15/2015    Medication Sig  . hydrochlorothiazide (HYDRODIURIL) 25 MG tablet TAKE 1 TABLET BY MOUTH ONCE DAILY  . ibuprofen (ADVIL,MOTRIN) 200 MG tablet Take 200 mg by mouth every 6 (six) hours as needed.  . Multiple Vitamins-Calcium (ONE-A-DAY WOMENS FORMULA PO) Take by mouth.  . simvastatin (ZOCOR) 10 MG tablet TAKE 1 TABLET BY MOUTH AT BEDTIME.  .Marland KitchenCholecalciferol (VITAMIN D-3) 1000 UNITS CAPS Take 1,000 Units by mouth daily.  . metroNIDAZOLE (METROGEL) 0.75 % gel Apply topically 2 (two) times daily. Apply to face bid  . vitamin B-12 (CYANOCOBALAMIN) 100 MCG tablet Take 50 mcg by mouth daily.   No facility-administered encounter medications on file as of 06/15/2015.    Review of Systems  Constitutional: Positive for fatigue. Negative for appetite change and unexpected weight change.  HENT: Negative for congestion and sinus pressure.   Eyes: Negative for pain and visual disturbance.  Respiratory: Negative for cough, chest tightness and shortness of breath.   Cardiovascular: Negative for chest pain, palpitations and leg swelling.  Gastrointestinal: Negative for nausea, vomiting, abdominal pain and diarrhea.  Genitourinary: Negative for dysuria and difficulty urinating.  Musculoskeletal: Negative for back pain and joint swelling.  Skin: Negative for color change and rash.  Neurological: Negative for dizziness, light-headedness and headaches.  Hematological: Negative for adenopathy. Does not bruise/bleed easily.  Psychiatric/Behavioral: Negative for dysphoric mood and agitation.       Objective:    Physical Exam  Constitutional: She is oriented to person, place, and time. She appears well-developed and well-nourished.  HENT:  Nose: Nose normal.  Mouth/Throat: Oropharynx is clear and moist.  Eyes: Right eye exhibits no discharge. Left eye exhibits no discharge. No scleral icterus.  Neck: Neck supple. No thyromegaly present.  Cardiovascular: Normal rate and regular rhythm.   Pulmonary/Chest:  Breath sounds normal. No accessory muscle usage. No tachypnea. No respiratory distress. She has no decreased breath sounds. She has no wheezes. She has no rhonchi. Right breast exhibits no inverted nipple, no mass, no nipple discharge and no tenderness (no axillary adenopathy). Left breast exhibits no inverted nipple, no mass, no nipple discharge and no tenderness (no axilarry adenopathy).  Abdominal: Soft. Bowel sounds are normal. There is no tenderness.  Musculoskeletal: She exhibits no edema or tenderness.  Lymphadenopathy:    She has no cervical adenopathy.  Neurological: She is alert and oriented to person, place, and time.  Skin: Skin is warm. No rash noted.  Psychiatric: She has a normal mood and affect. Her behavior is normal.    BP 126/80 mmHg  Pulse 64  Temp(Src) 98.4 F (36.9 C) (Oral)  Ht 5' 10.75" (1.797 m)  Wt 241 lb 8 oz (109.544 kg)  BMI 33.92 kg/m2  SpO2 97% Wt Readings from Last 3 Encounters:  06/15/15 241 lb 8 oz (109.544 kg)  02/02/15 227 lb 8 oz (103.193 kg)  09/30/14 229 lb (103.874 kg)     Lab Results  Component Value Date   WBC 6.6 06/15/2015   HGB 14.1 06/15/2015   HCT 41.2 06/15/2015   PLT 247.0 06/15/2015   GLUCOSE 84 06/15/2015   CHOL 195 06/15/2015   TRIG 77.0 06/15/2015   HDL 69.60 06/15/2015   LDLCALC 110* 06/15/2015   ALT 25 06/15/2015   AST 22 06/15/2015   NA 140 06/15/2015   K 4.2 06/15/2015   CL 104 06/15/2015   CREATININE 0.69 06/15/2015   BUN 11 06/15/2015   CO2 25 06/15/2015   TSH 0.56 06/15/2015   HGBA1C 5.7 06/15/2015       Assessment & Plan:   Problem List Items Addressed This Visit    Encounter for smoking cessation counseling    Discussed the need to stop smoking.  Follow.  She desires not to quit at this time.        Health care maintenance    Physical 06/15/15.  Mammogram 11/01/14 - Birads I.  PAP 01/26/12 - ok.       Hypercholesterolemia    Low cholesterol diet and exercise.  Follow lipid panel and liver function  tests.  On simvastatin.        Relevant Orders   Lipid panel (Completed)   Hepatic function panel (Completed)   Hyperglycemia    Low carb diet and exercise.  Follow met b and a1c.       Relevant Orders   TSH (Completed)   Hemoglobin A1c (Completed)   Hypertension - Primary    Blood pressure under good control.  Continue same medication regimen.  Follow pressures.  Follow metabolic panel.        Relevant Orders   CBC with Differential/Platelet (Completed)   Basic metabolic panel (Completed)   Obesity (BMI 30-39.9)    Diet and exercise.  Stress    Discussed with her today.  She desires no further intervention at this point.  Feels she is handling things relatively well.  Follow.        Thyroid fullness    Increased fullness on exam.  Saw Dr Eddie Dibbles 01/2015.  See her note for details.  Check tsh.            Einar Pheasant, MD

## 2015-06-16 ENCOUNTER — Encounter: Payer: Self-pay | Admitting: Internal Medicine

## 2015-06-18 ENCOUNTER — Encounter: Payer: Self-pay | Admitting: Internal Medicine

## 2015-06-18 NOTE — Assessment & Plan Note (Signed)
Discussed the need to stop smoking.  Follow.  She desires not to quit at this time.

## 2015-06-18 NOTE — Assessment & Plan Note (Signed)
Blood pressure under good control.  Continue same medication regimen.  Follow pressures.  Follow metabolic panel.   

## 2015-06-18 NOTE — Assessment & Plan Note (Signed)
Increased fullness on exam.  Saw Dr Eddie Dibbles 01/2015.  See her note for details.  Check tsh.

## 2015-06-18 NOTE — Assessment & Plan Note (Signed)
Low cholesterol diet and exercise.  Follow lipid panel and liver function tests.  On simvastatin.   

## 2015-06-18 NOTE — Assessment & Plan Note (Signed)
Physical 06/15/15.  Mammogram 11/01/14 - Birads I.  PAP 01/26/12 - ok.

## 2015-06-18 NOTE — Assessment & Plan Note (Signed)
Discussed with her today.  She desires no further intervention at this point.  Feels she is handling things relatively well.  Follow.

## 2015-06-18 NOTE — Assessment & Plan Note (Signed)
Diet and exercise.   

## 2015-06-18 NOTE — Assessment & Plan Note (Signed)
Low carb diet and exercise.  Follow met b and a1c.  

## 2015-06-19 NOTE — Telephone Encounter (Signed)
Unread mychart message mailed to patient 

## 2015-08-03 ENCOUNTER — Telehealth: Payer: Self-pay | Admitting: Internal Medicine

## 2015-08-03 NOTE — Telephone Encounter (Signed)
Stop the cholesterol medication and see if the cramps resolve.  Call with update in 2-3 weeks.  If resolve, will try a different cholesterol medication then.

## 2015-08-03 NOTE — Telephone Encounter (Signed)
Pt called to see if she could try another cholesterol medicine the  simvastatin (ZOCOR) 10 MG tablet [[id number]] is causing sever cramps. Please advise pt.

## 2015-08-03 NOTE — Telephone Encounter (Signed)
Please advise 

## 2015-08-03 NOTE — Telephone Encounter (Signed)
Spoke with patient and reviewed medication change, advised to call back in 2-3 weeks, verbalized understanding.

## 2015-08-23 ENCOUNTER — Telehealth: Payer: Self-pay | Admitting: Internal Medicine

## 2015-08-23 ENCOUNTER — Other Ambulatory Visit: Payer: Self-pay | Admitting: *Deleted

## 2015-08-23 MED ORDER — HYDROCHLOROTHIAZIDE 25 MG PO TABS: 25.0000 mg | ORAL_TABLET | Freq: Every day | ORAL | Status: DC

## 2015-08-23 MED ORDER — PRAVASTATIN SODIUM 10 MG PO TABS: 10.0000 mg | ORAL_TABLET | Freq: Every day | ORAL | Status: DC

## 2015-08-23 NOTE — Telephone Encounter (Signed)
Pt called about needing a refill on hydrochlorothiazide (HYDRODIURIL) 25 MG tablet . Pharmacy is [facility].  Pt stopped taking simvastatin (ZOCOR) 10 MG tablet and the leg cramps have stopped. Pt wants to know what is she to do because she is not taking any cholesterol medication?

## 2015-08-23 NOTE — Telephone Encounter (Signed)
Ok to refill hctz x 3.  Since cramping subsided after stopping the simvastatin, I would like to try pravastatin 10mg  q day.  Will need liver panel check 6 weeks after starting the medication.  Will also check fasting labs at that time.  Please schedule a fasting lab appt in 6 weeks.

## 2015-08-23 NOTE — Telephone Encounter (Signed)
Do you want to do anything else for this patient?

## 2015-08-23 NOTE — Telephone Encounter (Signed)
Pt notified &  Rx's sent to pharmacy & lab appt scheduled

## 2015-09-19 ENCOUNTER — Ambulatory Visit (INDEPENDENT_AMBULATORY_CARE_PROVIDER_SITE_OTHER): Payer: 59 | Admitting: Internal Medicine

## 2015-09-19 ENCOUNTER — Encounter: Payer: Self-pay | Admitting: Internal Medicine

## 2015-09-19 VITALS — BP 138/90 | HR 73 | Temp 98.3°F | Resp 18 | Ht 70.75 in | Wt 241.0 lb

## 2015-09-19 DIAGNOSIS — E0789 Other specified disorders of thyroid: Secondary | ICD-10-CM

## 2015-09-19 DIAGNOSIS — E7800 Pure hypercholesterolemia, unspecified: Secondary | ICD-10-CM

## 2015-09-19 DIAGNOSIS — Z716 Tobacco abuse counseling: Secondary | ICD-10-CM

## 2015-09-19 DIAGNOSIS — I10 Essential (primary) hypertension: Secondary | ICD-10-CM

## 2015-09-19 DIAGNOSIS — R21 Rash and other nonspecific skin eruption: Secondary | ICD-10-CM

## 2015-09-19 DIAGNOSIS — Z720 Tobacco use: Secondary | ICD-10-CM

## 2015-09-19 DIAGNOSIS — R739 Hyperglycemia, unspecified: Secondary | ICD-10-CM | POA: Diagnosis not present

## 2015-09-19 DIAGNOSIS — Z658 Other specified problems related to psychosocial circumstances: Secondary | ICD-10-CM

## 2015-09-19 DIAGNOSIS — F439 Reaction to severe stress, unspecified: Secondary | ICD-10-CM

## 2015-09-19 NOTE — Progress Notes (Signed)
[name], female   DOB: 1966/02/09, 49 y.o.   MRN: [id number]   Subjective:    [name], female    DOB: 08-24-1966, 49 y.o.   MRN: [id number]  HPI  Patient with past history of hypercholesterolemia and hypertension.  She comes in today for a scheduled follow up.  Increased stress.  She is caretaker for her grandchildren.  Has good support from her family.  No chest pain or tightness.  No sob.  No acid reflux.  No abdominal pain or cramping.  Bowels stable.  Persistent facial lesion.  Has tried metrogel.  Not help.  Discussed dermatology referral.     Past Medical History  Diagnosis Date  . Hypertension   . Hyperlipidemia    Past Surgical History  Procedure Laterality Date  . Vaginal delivery  1989    episiotomy, Dr. Arsenio Loader   Family History  Problem Relation Age of Onset  . Diabetes Mother   . Dementia Mother   . Stroke Father 38  . Hypertension Sister   . Thyroid disease Sister   . Diabetes Sister   . Hypertension Brother   . Diabetes Brother   . Hypertension Sister   . Hypertension Brother   . Hypertension Brother   . Hypertension Brother   . Hypertension Sister    Social History   Social History  . Marital Status: Single    Spouse Name: N/A  . Number of Children: N/A  . Years of Education: N/A   Social History Main Topics  . Smoking status: Current Some Day Smoker    Types: Cigarettes  . Smokeless tobacco: Never Used  . Alcohol Use: 0.0 oz/week    0 Standard drinks or equivalent per week  . Drug Use: No  . Sexual Activity: Not Asked   Other Topics Concern  . None   Social History Narrative   Lives in [place]. 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      Diet: regular diet, limits fried food   Exercise: walks goal 52mn daily    Outpatient Encounter Prescriptions as of 09/19/2015  Medication Sig  . Cholecalciferol (VITAMIN D-3) 1000 UNITS CAPS Take 1,000 Units by mouth daily.  .  hydrochlorothiazide (HYDRODIURIL) 25 MG tablet Take 1 tablet (25 mg total) by mouth daily.  .Marland Kitchenibuprofen (ADVIL,MOTRIN) 200 MG tablet Take 200 mg by mouth every 6 (six) hours as needed.  . metroNIDAZOLE (METROGEL) 0.75 % gel Apply topically 2 (two) times daily. Apply to face bid  . Multiple Vitamins-Calcium (ONE-A-DAY WOMENS FORMULA PO) Take by mouth.  . pravastatin (PRAVACHOL) 10 MG tablet Take 1 tablet (10 mg total) by mouth daily.  . vitamin B-12 (CYANOCOBALAMIN) 100 MCG tablet Take 50 mcg by mouth daily.   No facility-administered encounter medications on file as of 09/19/2015.    Review of Systems  Constitutional: Negative for appetite change and unexpected weight change.  HENT: Negative for congestion and sinus pressure.   Respiratory: Negative for cough, chest tightness and shortness of breath.   Cardiovascular: Negative for chest pain, palpitations and leg swelling.  Gastrointestinal: Negative for nausea, vomiting, abdominal pain and diarrhea.  Genitourinary: Negative for dysuria and difficulty urinating.  Musculoskeletal: Negative for back pain and joint swelling.  Skin: Negative for color change and rash.  Neurological: Negative for dizziness, light-headedness and headaches.  Psychiatric/Behavioral: Negative for dysphoric mood and agitation.       Objective:     Blood pressure rechecked by  me prior to her leaving 136/84  Physical Exam  Constitutional: She appears well-developed and well-nourished. No distress.  HENT:  Nose: Nose normal.  Mouth/Throat: Oropharynx is clear and moist.  Eyes: Conjunctivae are normal. Right eye exhibits no discharge. Left eye exhibits no discharge.  Neck: Neck supple. No thyromegaly present.  Cardiovascular: Normal rate and regular rhythm.   Pulmonary/Chest: Breath sounds normal. No respiratory distress. She has no wheezes.  Abdominal: Soft. Bowel sounds are normal. There is no tenderness.  Musculoskeletal: She exhibits no edema or  tenderness.  Lymphadenopathy:    She has no cervical adenopathy.  Skin: No rash noted. No erythema.  Psychiatric: She has a normal mood and affect. Her behavior is normal.    BP 138/90 mmHg  Pulse 73  Temp(Src) 98.3 F (36.8 C) (Oral)  Resp 18  Ht 5' 10.75" (1.797 m)  Wt 241 lb (109.317 kg)  BMI 33.85 kg/m2  SpO2 97% Wt Readings from Last 3 Encounters:  09/19/15 241 lb (109.317 kg)  06/15/15 241 lb 8 oz (109.544 kg)  02/02/15 227 lb 8 oz (103.193 kg)     Lab Results  Component Value Date   WBC 6.6 06/15/2015   HGB 14.1 06/15/2015   HCT 41.2 06/15/2015   PLT 247.0 06/15/2015   GLUCOSE 84 06/15/2015   CHOL 195 06/15/2015   TRIG 77.0 06/15/2015   HDL 69.60 06/15/2015   LDLCALC 110* 06/15/2015   ALT 25 06/15/2015   AST 22 06/15/2015   NA 140 06/15/2015   K 4.2 06/15/2015   CL 104 06/15/2015   CREATININE 0.69 06/15/2015   BUN 11 06/15/2015   CO2 25 06/15/2015   TSH 0.56 06/15/2015   HGBA1C 5.7 06/15/2015       Assessment & Plan:   Problem List Items Addressed This Visit    Encounter for smoking cessation counseling    Discussed the need to quit smoking.  She desires to continue.  Follow.        Facial rash    Persistent.  Has tried metrogel.  Refer to dermatology.        Relevant Orders   Ambulatory referral to Dermatology   Hypercholesterolemia    On pravastatin.  Some muscle aches.  Question if related to the pravastatin.  Low cholesterol diet and exercise.  Stop pravastatin.  Call with update.  Follow lipid panel and liver function tests.        Hyperglycemia    Low carb diet and exercise.  Follow met b and a1c.        Hypertension - Primary    Blood pressure as outlined.  Recheck improved.  Have her spot check her pressure.  Send in readings over the next few weeks.  Follow.  Same medication regimen.        Stress    Increased stress.  Discussed with her today.  Desires no further intervention at this time.  Follow.        Thyroid fullness     Followed by Dr Eddie Dibbles.  See her note.  Follow tsh.            Einar Pheasant, MD

## 2015-09-19 NOTE — Patient Instructions (Addendum)
Stop pravstatin.  Call with update in 2 weeks.

## 2015-09-19 NOTE — Progress Notes (Signed)
Pre-visit discussion using our clinic review tool. No additional management support is needed unless otherwise documented below in the visit note.  

## 2015-09-24 ENCOUNTER — Encounter: Payer: Self-pay | Admitting: Internal Medicine

## 2015-09-24 DIAGNOSIS — R21 Rash and other nonspecific skin eruption: Secondary | ICD-10-CM | POA: Insufficient documentation

## 2015-09-24 NOTE — Assessment & Plan Note (Signed)
Increased stress.  Discussed with her today.  Desires no further intervention at this time.  Follow.   

## 2015-09-24 NOTE — Assessment & Plan Note (Signed)
Persistent.  Has tried metrogel.  Refer to dermatology.

## 2015-09-24 NOTE — Assessment & Plan Note (Signed)
Low carb diet and exercise.  Follow met b and a1c.   

## 2015-09-24 NOTE — Assessment & Plan Note (Addendum)
On pravastatin.  Some muscle aches.  Question if related to the pravastatin.  Low cholesterol diet and exercise.  Stop pravastatin.  Call with update.  Follow lipid panel and liver function tests.

## 2015-09-24 NOTE — Assessment & Plan Note (Signed)
Discussed the need to quit smoking.  She desires to continue.  Follow.  

## 2015-09-24 NOTE — Assessment & Plan Note (Signed)
Followed by Dr Eddie Dibbles.  See her note.  Follow tsh.

## 2015-09-24 NOTE — Assessment & Plan Note (Signed)
Blood pressure as outlined.  Recheck improved.  Have her spot check her pressure.  Send in readings over the next few weeks.  Follow.  Same medication regimen.

## 2015-10-05 ENCOUNTER — Other Ambulatory Visit: Payer: 59

## 2015-11-15 DIAGNOSIS — H40013 Open angle with borderline findings, low risk, bilateral: Secondary | ICD-10-CM | POA: Diagnosis not present

## 2015-12-11 ENCOUNTER — Ambulatory Visit (INDEPENDENT_AMBULATORY_CARE_PROVIDER_SITE_OTHER): Payer: 59 | Admitting: Family Medicine

## 2015-12-11 ENCOUNTER — Encounter: Payer: Self-pay | Admitting: Family Medicine

## 2015-12-11 VITALS — BP 122/74 | HR 75 | Temp 97.8°F | Ht 70.75 in | Wt 250.2 lb

## 2015-12-11 DIAGNOSIS — L0100 Impetigo, unspecified: Secondary | ICD-10-CM | POA: Diagnosis not present

## 2015-12-11 MED ORDER — CEPHALEXIN 500 MG PO CAPS: 500.0000 mg | ORAL_CAPSULE | Freq: Four times a day (QID) | ORAL | Status: DC

## 2015-12-11 NOTE — Progress Notes (Signed)
   Subjective:  [name], female    DOB: 17-Nov-1965  Age: 50 y.o. MRN: [id number]  CC: Rash  HPI:  50 year old female presents to clinic today for evaluation of rash.  Rash  Started 1 week ago.  Started as small bumps/vesicles.  Located on the chin and left maxillary region.  She's applied creams and ointments with no improvement.  Areas have now ruptured and crusted over.  No associated fevers or chills.  No known exacerbating factors.  No known inciting event.  Social Hx   Social History   Social History  . Marital Status: Single    Spouse Name: N/A  . Number of Children: N/A  . Years of Education: N/A   Social History Main Topics  . Smoking status: Current Some Day Smoker    Types: Cigarettes  . Smokeless tobacco: Never Used  . Alcohol Use: 0.0 oz/week    0 Standard drinks or equivalent per week  . Drug Use: No  . Sexual Activity: Not Asked   Other Topics Concern  . None   Social History Narrative   Lives in [place]. 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      Diet: regular diet, limits fried food   Exercise: walks goal 42min daily   Review of Systems  Constitutional: Negative.   Skin: Positive for rash.   Objective:  BP 122/74 mmHg  Pulse 75  Temp(Src) 97.8 F (36.6 C) (Oral)  Ht 5' 10.75" (1.797 m)  Wt 250 lb 4 oz (113.513 kg)  BMI 35.15 kg/m2  SpO2 98%  BP/Weight 12/11/2015 09/19/2015 [id number]  Systolic BP 123XX123 0000000 123XX123  Diastolic BP 74 90 80  Wt. (Lbs) 250.25 241 241.5  BMI 35.15 33.85 33.92   Physical Exam  Constitutional: She is oriented to person, place, and time. She appears well-developed. No distress.  HENT:  Head: Normocephalic and atraumatic.  Mouth/Throat: Oropharynx is clear and moist.  Pulmonary/Chest: Effort normal.  Neurological: She is alert and oriented to person, place, and time.  Skin:  Face - 2 patches of papular rash with crusting (honey colored).   Psychiatric:  She has a normal mood and affect.  Vitals reviewed.  Lab Results  Component Value Date   WBC 6.6 06/15/2015   HGB 14.1 06/15/2015   HCT 41.2 06/15/2015   PLT 247.0 06/15/2015   GLUCOSE 84 06/15/2015   CHOL 195 06/15/2015   TRIG 77.0 06/15/2015   HDL 69.60 06/15/2015   LDLCALC 110* 06/15/2015   ALT 25 06/15/2015   AST 22 06/15/2015   NA 140 06/15/2015   K 4.2 06/15/2015   CL 104 06/15/2015   CREATININE 0.69 06/15/2015   BUN 11 06/15/2015   CO2 25 06/15/2015   TSH 0.56 06/15/2015   HGBA1C 5.7 06/15/2015    Assessment & Plan:   Problem List Items Addressed This Visit    Impetigo - Primary    New problem. Exam consistent with impetigo. Treating with Keflex.      Relevant Medications   cephALEXin (KEFLEX) 500 MG capsule      Meds ordered this encounter  Medications  . cephALEXin (KEFLEX) 500 MG capsule    Sig: Take 1 capsule (500 mg total) by mouth 4 (four) times daily.    Dispense:  28 capsule    Refill:  0    Follow-up: Return if symptoms worsen or fail to improve.  [place]

## 2015-12-11 NOTE — Assessment & Plan Note (Signed)
New problem. Exam consistent with impetigo. Treating with Keflex.

## 2015-12-11 NOTE — Progress Notes (Signed)
Pre visit review using our clinic review tool, if applicable. No additional management support is needed unless otherwise documented below in the visit note. 

## 2015-12-11 NOTE — Patient Instructions (Signed)
Take the antibiotic 4 times daily for 1 week.  Follow up as needed.  Take care  Dr. Lacinda Axon  Impetigo, Adult Impetigo is an infection of the skin. It commonly occurs in young children, but it can also occur in adults. The infection causes itchy blisters and sores that produce brownish-yellow fluid. As the fluid dries, it forms a thick, honey-colored crust. These skin changes usually occur on the face but can also affect other areas of the body. Impetigo usually goes away in 7-10 days with treatment. CAUSES Impetigo is caused by two types of bacteria. It may be caused by staphylococci or streptococci bacteria. These bacteria cause impetigo when they get under the surface of the skin. This often happens after some damage to the skin, such as damage from:  Cuts, scrapes, or scratches.  Insect bites, especially when you scratch the area of a bite.  Chickenpox or other illnesses that cause open skin sores.  Nail biting or chewing. Impetigo is contagious and can spread easily from one person to another. This may occur through close skin contact or by sharing towels, clothing, or other items with a person who has the infection. RISK FACTORS Some things that can increase the risk of getting this infection include:  Playing sports that include skin-to-skin contact with others.  Having a skin condition with open sores.  Having many skin cuts or scrapes.  Living in an area that has high humidity levels.  Having poor hygiene.  Having high levels of staphylococci in your nose. SIGNS AND SYMPTOMS Impetigo usually starts out as small blisters, often on the face. The blisters then break open and turn into tiny sores (lesions) with a yellow crust. In some cases, the blisters cause itching or burning. With scratching, irritation, or lack of treatment, these small lesions may get larger. Scratching can also cause impetigo to spread to other parts of the body. The bacteria can get under the fingernails  and spread when you touch another area of your skin. Other possible symptoms include:  Larger blisters.  Pus.  Swollen lymph glands. DIAGNOSIS This condition is usually diagnosed during a physical exam. A skin sample or sample of fluid from a blister may be taken for lab tests that involve growing bacteria (culture test). This can help confirm the diagnosis or help determine the best treatment. TREATMENT Mild impetigo can be treated with prescription antibiotic cream. Oral antibiotic medicine may be used in more severe cases. Medicines for itching may also be used. HOME CARE INSTRUCTIONS  Take medicines only as directed by your health care provider.  To help prevent impetigo from spreading to other body areas:  Keep your fingernails short and clean.  Do not scratch the blisters or sores.  Cover infected areas, if necessary, to keep from scratching.  Gently wash the infected areas with antibiotic soap and water.  Soak crusted areas in warm, soapy water using antibiotic soap.  Gently rub the areas to remove crusts. Do not scrub.  Wash your hands often to avoid spreading this infection.  Stay home until you have used an antibiotic cream for 48 hours (2 days) or an oral antibiotic medicine for 24 hours (1 day). You should only return to work and activities with other people if your skin shows significant improvement. PREVENTION  To keep the infection from spreading:  Stay home until you have used an antibiotic cream for 48 hours or an oral antibiotic for 24 hours.  Wash your hands often.  Do not engage in  skin-to-skin contact with other people while you have still have blisters.  Do not share towels, washcloths, or bedding with others while you have the infection. SEEK MEDICAL CARE IF:  You develop more blisters or sores despite treatment.  Other family members get sores.  Your skin sores are not improving after 48 hours of treatment.  You have a fever. SEEK IMMEDIATE  MEDICAL CARE IF:  You see spreading redness or swelling of the skin around your sores.  You see red streaks coming from your sores.  You develop a sore throat.   This information is not intended to replace advice given to you by your health care provider. Make sure you discuss any questions you have with your health care provider.   Document Released: 10/28/2014 Document Reviewed: 10/28/2014 Elsevier Interactive Patient Education [employer].

## 2015-12-15 ENCOUNTER — Telehealth: Payer: Self-pay | Admitting: Internal Medicine

## 2015-12-15 NOTE — Telephone Encounter (Signed)
Pt called about the medication that she was taking for the spot under and her cheek they have healed up. But now she has develop a fine rash under her nose and neck. Pt states they are itching she is using hydrocortizone cream. Pt wants to know if she needs to stop the medication or lower the dose? Call pt @ [phone]. Thank you!

## 2015-12-15 NOTE — Telephone Encounter (Signed)
Patient should stop the antibiotic and monitor for improvement or recurrence of rash. If the rash does not improve with stopping the medication she should be evaluated. If she develops any throat swelling, tongue swelling, or shortness of breath she should be evaluated. Thanks.

## 2015-12-15 NOTE — Telephone Encounter (Signed)
Patient saw Dr. Lacinda Axon for rash on face, he prescribed Keflex 1 cap four times daily#28.  Please advise?

## 2015-12-15 NOTE — Telephone Encounter (Signed)
Left a detailed VM for the patient.

## 2015-12-18 ENCOUNTER — Encounter: Payer: Self-pay | Admitting: Internal Medicine

## 2015-12-19 NOTE — Telephone Encounter (Signed)
Please call and confirm with pt that both rashes have resolved.  In Dr Jonathon Jordan note, it mentioned she had used various creams.  Unclear if has tried [place].  If has not tried [place], then can prescribe bactroban ointment or cream - apply to affected area bid (one tube - no refills).  Call if problems.

## 2015-12-19 NOTE — Telephone Encounter (Signed)
Spoke with the patient.  She states that she had called the on call service this weekend and they suggested to use Bacitracin ointment and antibacterial soap. She has been using those and the patches have dried up with the exception of one redden area.  She is more concerned that she had three days of antibiotics that weren't taken.  Please advise.  She is afraid it will come back?

## 2015-12-20 MED ORDER — MUPIROCIN 2 % EX OINT: TOPICAL_OINTMENT | CUTANEOUS | Status: DC

## 2015-12-20 NOTE — Telephone Encounter (Signed)
rx sent in for bactroban 

## 2015-12-20 NOTE — Addendum Note (Signed)
Addended by: Alisa Graff on: 12/20/2015 03:37 AM   Modules accepted: Orders

## 2016-01-17 ENCOUNTER — Encounter: Payer: Self-pay | Admitting: Internal Medicine

## 2016-01-17 ENCOUNTER — Ambulatory Visit (INDEPENDENT_AMBULATORY_CARE_PROVIDER_SITE_OTHER): Payer: 59 | Admitting: Internal Medicine

## 2016-01-17 VITALS — BP 132/80 | HR 62 | Temp 98.7°F | Resp 18 | Ht 70.75 in | Wt 246.2 lb

## 2016-01-17 DIAGNOSIS — I10 Essential (primary) hypertension: Secondary | ICD-10-CM

## 2016-01-17 DIAGNOSIS — R21 Rash and other nonspecific skin eruption: Secondary | ICD-10-CM

## 2016-01-17 DIAGNOSIS — F439 Reaction to severe stress, unspecified: Secondary | ICD-10-CM

## 2016-01-17 DIAGNOSIS — Z1239 Encounter for other screening for malignant neoplasm of breast: Secondary | ICD-10-CM | POA: Diagnosis not present

## 2016-01-17 DIAGNOSIS — R739 Hyperglycemia, unspecified: Secondary | ICD-10-CM | POA: Diagnosis not present

## 2016-01-17 DIAGNOSIS — E0789 Other specified disorders of thyroid: Secondary | ICD-10-CM

## 2016-01-17 DIAGNOSIS — E7800 Pure hypercholesterolemia, unspecified: Secondary | ICD-10-CM | POA: Diagnosis not present

## 2016-01-17 DIAGNOSIS — E669 Obesity, unspecified: Secondary | ICD-10-CM

## 2016-01-17 DIAGNOSIS — Z658 Other specified problems related to psychosocial circumstances: Secondary | ICD-10-CM

## 2016-01-17 LAB — HEPATIC FUNCTION PANEL
ALK PHOS: 64 U/L (ref 39–117)
ALT: 22 U/L (ref 0–35)
AST: 17 U/L (ref 0–37)
Albumin: 4.4 g/dL (ref 3.5–5.2)
BILIRUBIN TOTAL: 0.6 mg/dL (ref 0.2–1.2)
Bilirubin, Direct: 0.1 mg/dL (ref 0.0–0.3)
Total Protein: 7.1 g/dL (ref 6.0–8.3)

## 2016-01-17 LAB — BASIC METABOLIC PANEL
BUN: 13 mg/dL (ref 6–23)
CHLORIDE: 104 meq/L (ref 96–112)
CO2: 27 mEq/L (ref 19–32)
Calcium: 9.9 mg/dL (ref 8.4–10.5)
Creatinine, Ser: 0.78 mg/dL (ref 0.40–1.20)
GFR: 101.07 mL/min (ref >60.00)
Glucose, Bld: 96 mg/dL (ref 70–99)
POTASSIUM: 4.1 meq/L (ref 3.5–5.1)
Sodium: 140 mEq/L (ref 135–145)

## 2016-01-17 LAB — HEMOGLOBIN A1C: Hgb A1c MFr Bld: 6.1 % (ref 4.6–6.5)

## 2016-01-17 LAB — LIPID PANEL
Cholesterol: 229 mg/dL — ABNORMAL HIGH (ref 0–200)
HDL: 63 mg/dL (ref >39.00)
LDL Cholesterol: 142 mg/dL — ABNORMAL HIGH (ref 0–99)
NONHDL: 165.64
Total CHOL/HDL Ratio: 4
Triglycerides: 120 mg/dL (ref 0.0–149.0)
VLDL: 24 mg/dL (ref 0.0–40.0)

## 2016-01-17 NOTE — Progress Notes (Signed)
Pre-visit discussion using our clinic review tool. No additional management support is needed unless otherwise documented below in the visit note.  

## 2016-01-17 NOTE — Progress Notes (Signed)
[name], female   DOB: 02/26/1966, 50 y.o.   MRN: [id number]   Subjective:    [name], female    DOB: November 13, 1965, 50 y.o.   MRN: [id number]  HPI  Patient here for a scheduled follow up.  She is under increased stress with work and keeping her grandchildren.  She desires no further intervention.  Tries to stay active.  No cardiac symptoms with increased activity or exertion.  No sob.  No acid reflux.  No abdominal pain or cramping.  Bowels stable.  Some occasional leg cramps.  Discussed the need to stay hydrated.  Discussed stretching.  Concerned over reoccurring rash on her face.  Discussed dermatology referral.     Past Medical History  Diagnosis Date  . Hypertension   . Hyperlipidemia    Past Surgical History  Procedure Laterality Date  . Vaginal delivery  1989    episiotomy, Dr. Arsenio Wallace   Family History  Problem Relation Age of Onset  . Diabetes Mother   . Dementia Mother   . Stroke Father 38  . Hypertension Sister   . Thyroid disease Sister   . Diabetes Sister   . Hypertension Brother   . Diabetes Brother   . Hypertension Sister   . Hypertension Brother   . Hypertension Brother   . Hypertension Brother   . Hypertension Sister    Social History   Social History  . Marital Status: Single    Spouse Name: N/A  . Number of Children: N/A  . Years of Education: N/A   Social History Main Topics  . Smoking status: Current Some Day Smoker    Types: Cigarettes  . Smokeless tobacco: Never Used  . Alcohol Use: 0.0 oz/week    0 Standard drinks or equivalent per week  . Drug Use: No  . Sexual Activity: Not Asked   Other Topics Concern  . None   Social History Narrative   Lives in [place]. 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 2 grandchildren, 1 son.      Diet: regular diet, limits fried food   Exercise: walks goal 81mn daily    Outpatient Encounter Prescriptions as of 01/17/2016  Medication Sig  . Cholecalciferol  (VITAMIN D-3) 1000 UNITS CAPS Take 1,000 Units by mouth daily.  . hydrochlorothiazide (HYDRODIURIL) 25 MG tablet Take 1 tablet (25 mg total) by mouth daily.  .Marland Kitchenibuprofen (ADVIL,MOTRIN) 200 MG tablet Take 200 mg by mouth every 6 (six) hours as needed.  . metroNIDAZOLE (METROGEL) 0.75 % gel Apply topically 2 (two) times daily. Apply to face bid  . Multiple Vitamins-Calcium (ONE-A-DAY WOMENS FORMULA PO) Take by mouth.  . mupirocin ointment (BACTROBAN) 2 % Apply to affected area bid  . vitamin B-12 (CYANOCOBALAMIN) 100 MCG tablet Take 50 mcg by mouth daily.  . pravastatin (PRAVACHOL) 10 MG tablet Take 1 tablet (10 mg total) by mouth daily. (Patient not taking: Reported on 01/17/2016)  . [DISCONTINUED] cephALEXin (KEFLEX) 500 MG capsule Take 1 capsule (500 mg total) by mouth 4 (four) times daily.   No facility-administered encounter medications on file as of 01/17/2016.    Review of Systems  Constitutional: Negative for appetite change and unexpected weight change.  HENT: Negative for congestion and sinus pressure.   Respiratory: Negative for cough, chest tightness and shortness of breath.   Cardiovascular: Negative for chest pain, palpitations and leg swelling.  Gastrointestinal: Negative for nausea, vomiting, abdominal pain and diarrhea.  Genitourinary: Negative for dysuria and difficulty urinating.  Musculoskeletal: Negative for myalgias and joint swelling.       Some leg cramps as outlined.   Skin: Positive for rash. Negative for color change.  Neurological: Negative for dizziness, light-headedness and headaches.  Psychiatric/Behavioral: Negative for dysphoric mood and agitation.       Increased stress.        Objective:     Blood pressure rechecked by me:  128/132/78-80  Physical Exam  Constitutional: She appears well-developed and well-nourished. No distress.  HENT:  Nose: Nose normal.  Mouth/Throat: Oropharynx is clear and moist.  Neck: Neck supple. No thyromegaly present.    Cardiovascular: Normal rate and regular rhythm.   Pulmonary/Chest: Breath sounds normal. No respiratory distress. She has no wheezes.  Abdominal: Soft. Bowel sounds are normal. There is no tenderness.  Musculoskeletal: She exhibits no edema or tenderness.  Lymphadenopathy:    She has no cervical adenopathy.  Skin: No rash noted. No erythema.  Psychiatric: She has a normal mood and affect. Her behavior is normal.    BP 132/80 mmHg  Pulse 62  Temp(Src) 98.7 F (37.1 C) (Oral)  Resp 18  Ht 5' 10.75" (1.797 m)  Wt 246 lb 4 oz (111.698 kg)  BMI 34.59 kg/m2  SpO2 97% Wt Readings from Last 3 Encounters:  01/17/16 246 lb 4 oz (111.698 kg)  12/11/15 250 lb 4 oz (113.513 kg)  09/19/15 241 lb (109.317 kg)     Lab Results  Component Value Date   WBC 6.6 06/15/2015   HGB 14.1 06/15/2015   HCT 41.2 06/15/2015   PLT 247.0 06/15/2015   GLUCOSE 96 01/17/2016   CHOL 229* 01/17/2016   TRIG 120.0 01/17/2016   HDL 63.00 01/17/2016   LDLCALC 142* 01/17/2016   ALT 22 01/17/2016   AST 17 01/17/2016   NA 140 01/17/2016   K 4.1 01/17/2016   CL 104 01/17/2016   CREATININE 0.78 01/17/2016   BUN 13 01/17/2016   CO2 27 01/17/2016   TSH 0.56 06/15/2015   HGBA1C 6.1 01/17/2016       Assessment & Plan:   Problem List Items Addressed This Visit    Hypercholesterolemia    Low cholesterol diet ane exercise.  Follow lipid panel and liver function tests.  On pravastatin.        Relevant Orders   Lipid panel (Completed)   Hepatic function panel (Completed)   Hyperglycemia    Low carb diet and exercise.  Follow met b and a1c.       Relevant Orders   Hemoglobin A1c (Completed)   Basic metabolic panel (Completed)   Hypertension - Primary    Blood pressure on recheck improved.  Continue same medication regimen.  Follow pressures.  Follow metabolic panel.        Obesity (BMI 30-39.9)    Discussed diet and exercise.        Rash    Persistent reoccurring rash on her face.  Uses  bactroban prn.  Given reoccurrence, will refer to dermatology for evaluation.        Relevant Orders   Ambulatory referral to Dermatology   Screening for breast cancer    Mammogram ordered today.        Stress    Increased stress.  Discussed with her today. Feel she is handling things relatively well.  Follow.        Thyroid fullness    Followed by Dr Eddie Dibbles.  See her note.  Follow tsh.  Other Visit Diagnoses    Screening breast examination        Relevant Orders    MM DIGITAL SCREENING BILATERAL        Einar Pheasant, MD

## 2016-01-19 ENCOUNTER — Encounter: Payer: Self-pay | Admitting: *Deleted

## 2016-01-22 ENCOUNTER — Encounter: Payer: Self-pay | Admitting: Internal Medicine

## 2016-01-22 NOTE — Assessment & Plan Note (Signed)
Mammogram ordered today 

## 2016-01-22 NOTE — Assessment & Plan Note (Signed)
Persistent reoccurring rash on her face.  Uses bactroban prn.  Given reoccurrence, will refer to dermatology for evaluation.

## 2016-01-22 NOTE — Assessment & Plan Note (Signed)
Increased stress.  Discussed with her today. Feel she is handling things relatively well.  Follow.

## 2016-01-22 NOTE — Assessment & Plan Note (Signed)
Low cholesterol diet ane exercise.  Follow lipid panel and liver function tests.  On pravastatin.

## 2016-01-22 NOTE — Assessment & Plan Note (Signed)
Low carb diet and exercise.  Follow met b and a1c.  

## 2016-01-22 NOTE — Assessment & Plan Note (Signed)
Discussed diet and exercise 

## 2016-01-22 NOTE — Assessment & Plan Note (Signed)
Followed by Dr Eddie Dibbles.  See her note.  Follow tsh.

## 2016-01-22 NOTE — Assessment & Plan Note (Signed)
Blood pressure on recheck improved.  Continue same medication regimen.  Follow pressures.  Follow metabolic panel.   

## 2016-01-23 NOTE — Telephone Encounter (Signed)
Called pt & read unread mychart message. Pt was not taking her Pravastatin 10mg  daily, will restart.

## 2016-01-30 ENCOUNTER — Ambulatory Visit: Payer: 59

## 2016-02-06 ENCOUNTER — Ambulatory Visit
Admission: RE | Admit: 2016-02-06 | Discharge: 2016-02-06 | Disposition: A | Discharge status: Home / Self Care | Payer: 59 | Source: Ambulatory Visit | Attending: Internal Medicine | Admitting: Internal Medicine

## 2016-02-06 ENCOUNTER — Other Ambulatory Visit: Payer: Self-pay | Admitting: Internal Medicine

## 2016-02-06 DIAGNOSIS — Z1239 Encounter for other screening for malignant neoplasm of breast: Secondary | ICD-10-CM

## 2016-02-06 DIAGNOSIS — Z1231 Encounter for screening mammogram for malignant neoplasm of breast: Secondary | ICD-10-CM | POA: Insufficient documentation

## 2016-04-04 ENCOUNTER — Ambulatory Visit: Payer: 59 | Admitting: Internal Medicine

## 2016-07-30 DIAGNOSIS — H5213 Myopia, bilateral: Secondary | ICD-10-CM | POA: Diagnosis not present

## 2016-09-04 DIAGNOSIS — L219 Seborrheic dermatitis, unspecified: Secondary | ICD-10-CM | POA: Diagnosis not present

## 2016-09-19 ENCOUNTER — Other Ambulatory Visit: Payer: Self-pay | Admitting: Internal Medicine

## 2016-09-25 DIAGNOSIS — L219 Seborrheic dermatitis, unspecified: Secondary | ICD-10-CM | POA: Diagnosis not present

## 2016-09-25 DIAGNOSIS — L818 Other specified disorders of pigmentation: Secondary | ICD-10-CM | POA: Diagnosis not present

## 2016-11-05 ENCOUNTER — Ambulatory Visit (INDEPENDENT_AMBULATORY_CARE_PROVIDER_SITE_OTHER): Payer: 59 | Admitting: Family

## 2016-11-05 ENCOUNTER — Encounter: Payer: Self-pay | Admitting: Family

## 2016-11-05 VITALS — BP 146/88 | HR 79 | Temp 98.6°F | Ht 70.75 in | Wt 245.6 lb

## 2016-11-05 DIAGNOSIS — R05 Cough: Secondary | ICD-10-CM | POA: Diagnosis not present

## 2016-11-05 DIAGNOSIS — R059 Cough, unspecified: Secondary | ICD-10-CM

## 2016-11-05 LAB — POCT INFLUENZA A/B
INFLUENZA A, POC: NEGATIVE
Influenza B, POC: NEGATIVE

## 2016-11-05 MED ORDER — BENZONATATE 100 MG PO CAPS: 100.0000 mg | ORAL_CAPSULE | Freq: Two times a day (BID) | ORAL | 0 refills | Status: DC | PRN

## 2016-11-05 NOTE — Progress Notes (Signed)
Subjective:    [name], female    DOB: Jul 13, 1966, 51 y.o.   MRN: [id number]  CC: Paula Wallace is a 51 y.o. female who presents today for an acute visit.    HPI: CC: productive cough, congestion x 5 days, worsening. Tried mucinex-D and ibuprofen with some relief.    Endorses feeling achey. No SOB, wheezing, sinus pressure, sore throat. Smoker.recently started on the c-cig.    HISTORY:  Past Medical History:  Diagnosis Date  . Hyperlipidemia   . Hypertension    Past Surgical History:  Procedure Laterality Date  . VAGINAL DELIVERY  1989   episiotomy, Dr. Arsenio Loader   Family History  Problem Relation Age of Onset  . Diabetes Mother   . Dementia Mother   . Stroke Father 34  . Hypertension Sister   . Thyroid disease Sister   . Diabetes Sister   . Hypertension Brother   . Diabetes Brother   . Hypertension Sister   . Hypertension Brother   . Hypertension Brother   . Hypertension Brother   . Hypertension Sister   . Breast cancer Neg Hx     Allergies: Keflex [cephalexin] and Mupirocin Current Outpatient Prescriptions on File Prior to Visit  Medication Sig Dispense Refill  . Cholecalciferol (VITAMIN D-3) 1000 UNITS CAPS Take 1,000 Units by mouth daily.    . hydrochlorothiazide (HYDRODIURIL) 25 MG tablet TAKE 1 TABLET (25 MG TOTAL) BY MOUTH DAILY. 90 tablet 3  . hydrochlorothiazide (HYDRODIURIL) 25 MG tablet TAKE 1 TABLET (25 MG TOTAL) BY MOUTH DAILY. 90 tablet 2  . ibuprofen (ADVIL,MOTRIN) 200 MG tablet Take 200 mg by mouth every 6 (six) hours as needed.    . metroNIDAZOLE (METROGEL) 0.75 % gel Apply topically 2 (two) times daily. Apply to face bid 45 g 0  . Multiple Vitamins-Calcium (ONE-A-DAY WOMENS FORMULA PO) Take by mouth.    . mupirocin ointment (BACTROBAN) 2 % Apply to affected area bid 30 g 0  . pravastatin (PRAVACHOL) 10 MG tablet Take 1 tablet (10 mg total) by mouth daily. (Patient not taking: Reported on 01/17/2016) 90 tablet 3  .  vitamin B-12 (CYANOCOBALAMIN) 100 MCG tablet Take 50 mcg by mouth daily.     No current facility-administered medications on file prior to visit.     Social History  Substance Use Topics  . Smoking status: Current Some Day Smoker    Types: Cigarettes  . Smokeless tobacco: Never Used  . Alcohol use 0.0 oz/week    Review of Systems  Constitutional: Negative for chills and fever.  HENT: Positive for congestion. Negative for ear pain, sinus pressure and sore throat.   Respiratory: Positive for cough. Negative for shortness of breath and wheezing.   Cardiovascular: Negative for chest pain and palpitations.  Gastrointestinal: Negative for nausea and vomiting.      Objective:    BP (!) 146/88   Pulse 79   Temp 98.6 F (37 C) (Oral)   Ht 5' 10.75" (1.797 m)   Wt 245 lb 9.6 oz (111.4 kg)   SpO2 98%   BMI 34.50 kg/m    Physical Exam  Constitutional: She appears well-developed and well-nourished.  HENT:  Head: Normocephalic and atraumatic.  Right Ear: Hearing, tympanic membrane, external ear and ear canal normal. No drainage, swelling or tenderness. No foreign bodies. Tympanic membrane is not erythematous and not bulging. No middle ear effusion. No decreased hearing is noted.  Left Ear: Hearing, tympanic membrane, external ear and ear canal normal.  No drainage, swelling or tenderness. No foreign bodies. Tympanic membrane is not erythematous and not bulging.  No middle ear effusion. No decreased hearing is noted.  Nose: Nose normal. No rhinorrhea. Right sinus exhibits no maxillary sinus tenderness and no frontal sinus tenderness. Left sinus exhibits no maxillary sinus tenderness and no frontal sinus tenderness.  Mouth/Throat: Uvula is midline, oropharynx is clear and moist and mucous membranes are normal. No oropharyngeal exudate, posterior oropharyngeal edema, posterior oropharyngeal erythema or tonsillar abscesses.  Eyes: Conjunctivae are normal.  Cardiovascular: Regular rhythm,  normal heart sounds and normal pulses.   Pulmonary/Chest: Effort normal and breath sounds normal. She has no wheezes. She has no rhonchi. She has no rales.  Lymphadenopathy:       Head (right side): No submental, no submandibular, no tonsillar, no preauricular, no posterior auricular and no occipital adenopathy present.       Head (left side): No submental, no submandibular, no tonsillar, no preauricular, no posterior auricular and no occipital adenopathy present.    She has no cervical adenopathy.  Neurological: She is alert.  Skin: Skin is warm and dry.  Psychiatric: She has a normal mood and affect. Her speech is normal and behavior is normal. Thought content normal.  Vitals reviewed.      Assessment & Plan:   1. Cough Working diagnosis viral bronchitis. SAo2 98. Afebrile. Patient and I jointly agreed to delay antibiotic therapy and treat conservatively over the next couple days with plain Mucinex. Advised patient to discontinue Mucinex with decongestant as suspects elevating her blood pressure. Patient will use Tessalon as needed for cough.  - POCT Influenza A/B - benzonatate (TESSALON) 100 MG capsule; Take 1 capsule (100 mg total) by mouth 2 (two) times daily as needed for cough.  Dispense: 20 capsule; Refill: 0    I am having Paula Wallace maintain her vitamin B-12, ibuprofen, Vitamin D-3, Multiple Vitamins-Calcium (ONE-A-DAY WOMENS FORMULA PO), metroNIDAZOLE, pravastatin, mupirocin ointment, hydrochlorothiazide, and hydrochlorothiazide.   No orders of the defined types were placed in this encounter.   Return precautions given.   Risks, benefits, and alternatives of the medications and treatment plan prescribed today were discussed, and patient expressed understanding.   Education regarding symptom management and diagnosis given to patient on AVS.  Continue to follow with Einar Pheasant, MD for routine health maintenance.   [name] and I agreed with plan.    Mable Paris, FNP

## 2016-11-05 NOTE — Progress Notes (Signed)
Pre visit review using our clinic review tool, if applicable. No additional management support is needed unless otherwise documented below in the visit note. 

## 2016-11-05 NOTE — Patient Instructions (Signed)
Switch to PLAIN mucinex.   LOTS  Of water.   Increase intake of clear fluids. Congestion is best treated by hydration, when mucus is wetter, it is thinner, less sticky, and easier to expel from the body, either through coughing up drainage, or by blowing your nose.   Get plenty of rest.   Use saline nasal drops and blow your nose frequently. Run a humidifier at night and elevate the head of the bed. Vicks Vapor rub will help with congestion and cough. Steam showers and sinus massage for congestion.   Use Acetaminophen or Ibuprofen as needed for fever or pain. Avoid second hand smoke. Even the smallest exposure will worsen symptoms.   Over the counter medications you can try include Delsym for cough, a decongestant for congestion, and Mucinex or Robitussin as an expectorant. Be sure to just get the plain Mucinex or Robitussin that just has one medication (Guaifenesen). We don't recommend the combination products. Note, be sure to drink two glasses of water with each dose of Mucinex as the medication will not work well without adequate hydration.   You can also try a teaspoon of honey to see if this will help reduce cough. Throat lozenges can sometimes be beneficial as well.    This illness will typically last 7 - 10 days.   Please follow up with our clinic if you develop a fever greater than 101 F, symptoms worsen, or do not resolve in the next week.

## 2016-12-18 ENCOUNTER — Other Ambulatory Visit (HOSPITAL_COMMUNITY)
Admission: RE | Admit: 2016-12-18 | Discharge: 2016-12-18 | Disposition: A | Discharge status: Home / Self Care | Payer: 59 | Source: Ambulatory Visit | Attending: Internal Medicine | Admitting: Internal Medicine

## 2016-12-18 ENCOUNTER — Ambulatory Visit (INDEPENDENT_AMBULATORY_CARE_PROVIDER_SITE_OTHER): Payer: 59 | Admitting: Internal Medicine

## 2016-12-18 ENCOUNTER — Ambulatory Visit (INDEPENDENT_AMBULATORY_CARE_PROVIDER_SITE_OTHER): Payer: 59

## 2016-12-18 ENCOUNTER — Encounter: Payer: Self-pay | Admitting: Internal Medicine

## 2016-12-18 VITALS — BP 130/82 | HR 82 | Temp 98.6°F | Resp 16 | Ht 71.0 in | Wt 245.8 lb

## 2016-12-18 DIAGNOSIS — E669 Obesity, unspecified: Secondary | ICD-10-CM | POA: Diagnosis not present

## 2016-12-18 DIAGNOSIS — I10 Essential (primary) hypertension: Secondary | ICD-10-CM | POA: Diagnosis not present

## 2016-12-18 DIAGNOSIS — M545 Low back pain, unspecified: Secondary | ICD-10-CM

## 2016-12-18 DIAGNOSIS — Z1231 Encounter for screening mammogram for malignant neoplasm of breast: Secondary | ICD-10-CM | POA: Diagnosis not present

## 2016-12-18 DIAGNOSIS — Z124 Encounter for screening for malignant neoplasm of cervix: Secondary | ICD-10-CM

## 2016-12-18 DIAGNOSIS — Z0000 Encounter for general adult medical examination without abnormal findings: Secondary | ICD-10-CM

## 2016-12-18 DIAGNOSIS — M79605 Pain in left leg: Secondary | ICD-10-CM | POA: Diagnosis not present

## 2016-12-18 DIAGNOSIS — E7800 Pure hypercholesterolemia, unspecified: Secondary | ICD-10-CM

## 2016-12-18 DIAGNOSIS — Z1151 Encounter for screening for human papillomavirus (HPV): Secondary | ICD-10-CM | POA: Insufficient documentation

## 2016-12-18 DIAGNOSIS — E0789 Other specified disorders of thyroid: Secondary | ICD-10-CM

## 2016-12-18 DIAGNOSIS — Z01419 Encounter for gynecological examination (general) (routine) without abnormal findings: Secondary | ICD-10-CM | POA: Diagnosis not present

## 2016-12-18 DIAGNOSIS — F439 Reaction to severe stress, unspecified: Secondary | ICD-10-CM

## 2016-12-18 DIAGNOSIS — Z1239 Encounter for other screening for malignant neoplasm of breast: Secondary | ICD-10-CM

## 2016-12-18 DIAGNOSIS — R739 Hyperglycemia, unspecified: Secondary | ICD-10-CM

## 2016-12-18 DIAGNOSIS — M4316 Spondylolisthesis, lumbar region: Secondary | ICD-10-CM | POA: Diagnosis not present

## 2016-12-18 LAB — BASIC METABOLIC PANEL
BUN: 9 mg/dL (ref 6–23)
CALCIUM: 9.7 mg/dL (ref 8.4–10.5)
CO2: 26 meq/L (ref 19–32)
Chloride: 106 mEq/L (ref 96–112)
Creatinine, Ser: 0.76 mg/dL (ref 0.40–1.20)
GFR: 103.75 mL/min (ref >60.00)
GLUCOSE: 95 mg/dL (ref 70–99)
Potassium: 4.2 mEq/L (ref 3.5–5.1)
SODIUM: 138 meq/L (ref 135–145)

## 2016-12-18 LAB — CBC WITH DIFFERENTIAL/PLATELET
BASOS ABS: 0 10*3/uL (ref 0.0–0.1)
BASOS PCT: 0.4 % (ref 0.0–3.0)
EOS ABS: 0.2 10*3/uL (ref 0.0–0.7)
Eosinophils Relative: 2.4 % (ref 0.0–5.0)
HCT: 42.4 % (ref 36.0–46.0)
Hemoglobin: 14.3 g/dL (ref 12.0–15.0)
LYMPHS ABS: 2 10*3/uL (ref 0.7–4.0)
Lymphocytes Relative: 29.2 % (ref 12.0–46.0)
MCHC: 33.8 g/dL (ref 30.0–36.0)
MCV: 93.9 fl (ref 78.0–100.0)
MONO ABS: 0.5 10*3/uL (ref 0.1–1.0)
Monocytes Relative: 6.8 % (ref 3.0–12.0)
NEUTROS ABS: 4.1 10*3/uL (ref 1.4–7.7)
NEUTROS PCT: 61.2 % (ref 43.0–77.0)
PLATELETS: 254 10*3/uL (ref 150.0–400.0)
RBC: 4.51 Mil/uL (ref 3.87–5.11)
RDW: 13.5 % (ref 11.5–15.5)
WBC: 6.7 10*3/uL (ref 4.0–10.5)

## 2016-12-18 LAB — HEPATIC FUNCTION PANEL
ALK PHOS: 59 U/L (ref 39–117)
ALT: 22 U/L (ref 0–35)
AST: 17 U/L (ref 0–37)
Albumin: 4.3 g/dL (ref 3.5–5.2)
BILIRUBIN DIRECT: 0.1 mg/dL (ref 0.0–0.3)
Total Bilirubin: 0.6 mg/dL (ref 0.2–1.2)
Total Protein: 7 g/dL (ref 6.0–8.3)

## 2016-12-18 LAB — LIPID PANEL
CHOLESTEROL: 255 mg/dL — AB (ref 0–200)
HDL: 66.5 mg/dL (ref >39.00)
LDL CALC: 166 mg/dL — AB (ref 0–99)
NonHDL: 188.11
Total CHOL/HDL Ratio: 4
Triglycerides: 109 mg/dL (ref 0.0–149.0)
VLDL: 21.8 mg/dL (ref 0.0–40.0)

## 2016-12-18 LAB — HEMOGLOBIN A1C: Hgb A1c MFr Bld: 6.1 % (ref 4.6–6.5)

## 2016-12-18 LAB — TSH: TSH: 0.58 u[IU]/mL (ref 0.35–4.50)

## 2016-12-18 NOTE — Assessment & Plan Note (Addendum)
Physical today 12/18/16.  PAP 12/18/16.  Mammogram 02/06/16 - Birads I.  Schedule f/u mammogram.  Discussed the need for upcoming colonoscopy.

## 2016-12-18 NOTE — Progress Notes (Signed)
Pre-visit discussion using our clinic review tool. No additional management support is needed unless otherwise documented below in the visit note.  

## 2016-12-18 NOTE — Progress Notes (Signed)
Patient ID: Galina Haddox, female   DOB: 05/10/1966, 51 y.o.   MRN: [id number]   Subjective:    Patient ID: Grizel Vesely, female    DOB: 16-Jul-1966, 51 y.o.   MRN: [id number]  HPI  Patient here for her physical exam.  Increased stress.  Discussed with her today.  She does not feel needs any further intervention.  Has good family support.  No chest pain.  Tries to stay active.  Breathing stable.  No abdominal pain or cramping.  Has noticed some increased back pain and pain extending into her calf.  Getting in and out of the car aggravates.  Has had mri previously.  Discussed further evaluation with her today.     Past Medical History:  Diagnosis Date  . Hyperlipidemia   . Hypertension    Past Surgical History:  Procedure Laterality Date  . VAGINAL DELIVERY  1989   episiotomy, Dr. Arsenio Loader   Family History  Problem Relation Age of Onset  . Diabetes Mother   . Dementia Mother   . Stroke Father 34  . Hypertension Sister   . Thyroid disease Sister   . Diabetes Sister   . Hypertension Brother   . Diabetes Brother   . Hypertension Sister   . Hypertension Brother   . Hypertension Brother   . Hypertension Brother   . Hypertension Sister   . Breast cancer Neg Hx    Social History   Social History  . Marital status: Single    Spouse name: N/A  . Number of children: N/A  . Years of education: N/A   Social History Main Topics  . Smoking status: Current Some Day Smoker    Types: Cigarettes  . Smokeless tobacco: Never Used  . Alcohol use 0.0 oz/week  . Drug use: No  . Sexual activity: Not Asked   Other Topics Concern  . None   Social History Narrative   Lives in [place]. 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      Diet: regular diet, limits fried food   Exercise: walks goal 26mn daily    Outpatient Encounter Prescriptions as of 12/18/2016  Medication Sig  . hydrochlorothiazide (HYDRODIURIL) 25 MG tablet TAKE 1 TABLET (25 MG TOTAL)  BY MOUTH DAILY.  .Marland Kitchenibuprofen (ADVIL,MOTRIN) 200 MG tablet Take 200 mg by mouth every 6 (six) hours as needed.  . [DISCONTINUED] benzonatate (TESSALON) 100 MG capsule Take 1 capsule (100 mg total) by mouth 2 (two) times daily as needed for cough. (Patient not taking: Reported on 12/18/2016)  . [DISCONTINUED] hydrochlorothiazide (HYDRODIURIL) 25 MG tablet TAKE 1 TABLET (25 MG TOTAL) BY MOUTH DAILY.   No facility-administered encounter medications on file as of 12/18/2016.     Review of Systems  Constitutional: Negative for appetite change and unexpected weight change.  HENT: Negative for congestion and sinus pressure.   Eyes: Negative for pain and visual disturbance.  Respiratory: Negative for cough, chest tightness and shortness of breath.   Cardiovascular: Negative for chest pain, palpitations and leg swelling.  Gastrointestinal: Negative for abdominal pain, diarrhea, nausea and vomiting.  Genitourinary: Negative for difficulty urinating and dysuria.  Musculoskeletal: Positive for back pain. Negative for joint swelling.  Skin: Negative for color change and rash.  Neurological: Negative for dizziness, light-headedness and headaches.  Hematological: Negative for adenopathy. Does not bruise/bleed easily.  Psychiatric/Behavioral: Negative for agitation and dysphoric mood.       Objective:    Physical Exam  Constitutional: She is oriented  to person, place, and time. She appears well-developed and well-nourished. No distress.  HENT:  Nose: Nose normal.  Mouth/Throat: Oropharynx is clear and moist.  Eyes: Right eye exhibits no discharge. Left eye exhibits no discharge. No scleral icterus.  Neck: Neck supple.  Cardiovascular: Normal rate and regular rhythm.   Pulmonary/Chest: Breath sounds normal. No accessory muscle usage. No tachypnea. No respiratory distress. She has no decreased breath sounds. She has no wheezes. She has no rhonchi. Right breast exhibits no inverted nipple, no mass, no  nipple discharge and no tenderness (no axillary adenopathy). Left breast exhibits no inverted nipple, no mass, no nipple discharge and no tenderness (no axilarry adenopathy).  Abdominal: Soft. Bowel sounds are normal. There is no tenderness.  Genitourinary:  Genitourinary Comments: Normal external genitalia.  Vaginal vault without lesions.  Cervix identified.  Pap smear performed.  Could not appreciate any adnexal masses or tenderness.    Musculoskeletal: She exhibits no edema or tenderness.  Lymphadenopathy:    She has no cervical adenopathy.  Neurological: She is alert and oriented to person, place, and time.  Skin: Skin is warm. No rash noted. No erythema.  Psychiatric: She has a normal mood and affect. Her behavior is normal.    BP 130/82 (BP Location: Left Arm, Patient Position: Sitting, Cuff Size: Large)   Pulse 82   Temp 98.6 F (37 C) (Oral)   Resp 16   Ht 5' 11"  (1.803 m)   Wt 245 lb 12.8 oz (111.5 kg)   SpO2 97%   BMI 34.28 kg/m  Wt Readings from Last 3 Encounters:  12/18/16 245 lb 12.8 oz (111.5 kg)  11/05/16 245 lb 9.6 oz (111.4 kg)  01/17/16 246 lb 4 oz (111.7 kg)     Lab Results  Component Value Date   WBC 6.7 12/18/2016   HGB 14.3 12/18/2016   HCT 42.4 12/18/2016   PLT 254.0 12/18/2016   GLUCOSE 95 12/18/2016   CHOL 255 (H) 12/18/2016   TRIG 109.0 12/18/2016   HDL 66.50 12/18/2016   LDLCALC 166 (H) 12/18/2016   ALT 22 12/18/2016   AST 17 12/18/2016   NA 138 12/18/2016   K 4.2 12/18/2016   CL 106 12/18/2016   CREATININE 0.76 12/18/2016   BUN 9 12/18/2016   CO2 26 12/18/2016   TSH 0.58 12/18/2016   HGBA1C 6.1 12/18/2016    Mm Screening Breast Tomo Bilateral  Result Date: 02/06/2016 CLINICAL DATA:  Screening. EXAM: 2D DIGITAL SCREENING BILATERAL MAMMOGRAM WITH CAD AND ADJUNCT TOMO COMPARISON:  Previous exam(s). ACR Breast Density Category b: There are scattered areas of fibroglandular density. FINDINGS: There are no findings suspicious for malignancy.  Images were processed with CAD. IMPRESSION: No mammographic evidence of malignancy. A result letter of this screening mammogram will be mailed directly to the patient. RECOMMENDATION: Screening mammogram in one year. (Code:SM-B-01Y) BI-RADS CATEGORY  1: Negative. Electronically Signed   By: Claudie Revering M.D.   On: 02/06/2016 16:44       Assessment & Plan:   Problem List Items Addressed This Visit    Back pain    Had MRI previously.  Increased pain recently.  Check L-S spine xray.  Follow .        Relevant Orders   DG Lumbar Spine 2-3 Views (Completed)   Health care maintenance    Physical today 12/18/16.  PAP 12/18/16.  Mammogram 02/06/16 - Birads I.  Schedule f/u mammogram.  Discussed the need for upcoming colonoscopy.  Hypercholesterolemia    On crestor.  Low cholesterol diet and exercise.  Follow lipid panel and liver function tests.        Relevant Orders   Lipid panel (Completed)   Hepatic function panel (Completed)   Hyperglycemia    Low carb diet and exercise.  Follow met b and a1c.       Relevant Orders   Hemoglobin A1c (Completed)   Hypertension    Blood pressure under good control.  Continue same medication regimen.  Follow pressures.  Follow metabolic panel.        Relevant Orders   CBC with Differential/Platelet (Completed)   TSH (Completed)   Basic metabolic panel (Completed)   Obesity (BMI 30-39.9)    Diet and exercise.  Follow.       Stress    Increased stress as outlined.  Discussed with her today.  She desires no further intervention.  Has good family support.  Follow.        Thyroid fullness    Followed by Dr Eddie Dibbles.         Other Visit Diagnoses    Breast cancer screening    -  Primary   Relevant Orders   MM DIGITAL SCREENING BILATERAL   Left leg pain       Relevant Orders   DG Lumbar Spine 2-3 Views (Completed)   Cervical cancer screening       Relevant Orders   Cytology - PAP (Completed)       Einar Pheasant, MD

## 2016-12-19 ENCOUNTER — Telehealth: Payer: Self-pay

## 2016-12-19 LAB — CYTOLOGY - PAP
Diagnosis: NEGATIVE
HPV: NOT DETECTED

## 2016-12-19 MED ORDER — ROSUVASTATIN CALCIUM 5 MG PO TABS: 5.0000 mg | ORAL_TABLET | Freq: Every day | ORAL | 1 refills | Status: DC

## 2016-12-19 NOTE — Telephone Encounter (Signed)
Also need to give xray results below Notify pt that her back xray reveals similar to previous xray. Given her persistent and now worsening pain, I would like to refer her back to neurosurgery for further evaluation and treatment.  If agreeable, let me know and I will place the order  I have called pt l/m to call office.

## 2016-12-19 NOTE — Telephone Encounter (Signed)
Spoke to pt lab app made script called in for pt with 1 refill. She will call if any questions.

## 2016-12-19 NOTE — Telephone Encounter (Signed)
-----   Message from Einar Pheasant, MD sent at 12/19/2016  5:11 AM EST ----- Please call and notify pt that her cholesterol has increased.  I recommend a low cholesterol diet and exercise.  I would also like to start crestor 5mg  q day.  If agreeable to start crestor, she will need a follow up liver panel checked 6 weeks after starting and will need to send in rx.  Please schedule a non fasting lab appt.  Her kidney function tests, hgb, thyroid test and liver function tests are wnl.  Overall sugar control - stable (a1c 6.1).

## 2016-12-20 ENCOUNTER — Encounter: Payer: Self-pay | Admitting: Internal Medicine

## 2016-12-22 ENCOUNTER — Encounter: Payer: Self-pay | Admitting: Internal Medicine

## 2016-12-22 NOTE — Assessment & Plan Note (Signed)
Diet and exercise.  Follow.  

## 2016-12-22 NOTE — Assessment & Plan Note (Signed)
Had MRI previously.  Increased pain recently.  Check L-S spine xray.  Follow .

## 2016-12-22 NOTE — Assessment & Plan Note (Signed)
Followed by Dr Paul.  

## 2016-12-22 NOTE — Assessment & Plan Note (Signed)
Blood pressure under good control.  Continue same medication regimen.  Follow pressures.  Follow metabolic panel.   

## 2016-12-22 NOTE — Assessment & Plan Note (Signed)
On crestor.  Low cholesterol diet and exercise.  Follow lipid panel and liver function tests.   

## 2016-12-22 NOTE — Assessment & Plan Note (Signed)
Increased stress as outlined.  Discussed with her today.  She desires no further intervention.  Has good family support.  Follow.

## 2016-12-22 NOTE — Assessment & Plan Note (Signed)
Low carb diet and exercise.  Follow met b and a1c.  

## 2016-12-25 NOTE — Telephone Encounter (Signed)
Letter mailed

## 2017-01-27 ENCOUNTER — Telehealth: Payer: Self-pay | Admitting: Radiology

## 2017-01-27 NOTE — Telephone Encounter (Signed)
Pt coming in for labs tomorrow, please place future orders. Thank you.  

## 2017-01-28 ENCOUNTER — Other Ambulatory Visit (INDEPENDENT_AMBULATORY_CARE_PROVIDER_SITE_OTHER): Payer: 59

## 2017-01-28 ENCOUNTER — Other Ambulatory Visit: Payer: Self-pay | Admitting: Internal Medicine

## 2017-01-28 DIAGNOSIS — E7800 Pure hypercholesterolemia, unspecified: Secondary | ICD-10-CM

## 2017-01-28 LAB — HEPATIC FUNCTION PANEL
ALK PHOS: 57 U/L (ref 39–117)
ALT: 28 U/L (ref 0–35)
AST: 21 U/L (ref 0–37)
Albumin: 4.2 g/dL (ref 3.5–5.2)
BILIRUBIN DIRECT: 0.1 mg/dL (ref 0.0–0.3)
BILIRUBIN TOTAL: 0.5 mg/dL (ref 0.2–1.2)
Total Protein: 7.1 g/dL (ref 6.0–8.3)

## 2017-01-28 NOTE — Progress Notes (Signed)
Order placed for f/u liver panel.  

## 2017-01-28 NOTE — Telephone Encounter (Signed)
Order placed for f/u liver panel.  

## 2017-01-29 ENCOUNTER — Encounter: Payer: Self-pay | Admitting: Internal Medicine

## 2017-01-31 NOTE — Telephone Encounter (Signed)
Hard copy mailed  

## 2017-02-06 ENCOUNTER — Ambulatory Visit
Admission: RE | Admit: 2017-02-06 | Discharge: 2017-02-06 | Disposition: A | Discharge status: Home / Self Care | Payer: 59 | Source: Ambulatory Visit | Attending: Internal Medicine | Admitting: Internal Medicine

## 2017-02-06 ENCOUNTER — Other Ambulatory Visit: Payer: Self-pay | Admitting: Internal Medicine

## 2017-02-06 DIAGNOSIS — Z1231 Encounter for screening mammogram for malignant neoplasm of breast: Secondary | ICD-10-CM | POA: Insufficient documentation

## 2017-02-06 DIAGNOSIS — Z1239 Encounter for other screening for malignant neoplasm of breast: Secondary | ICD-10-CM

## 2017-02-20 ENCOUNTER — Telehealth: Payer: Self-pay | Admitting: Internal Medicine

## 2017-02-20 NOTE — Telephone Encounter (Signed)
Pt called in regards to a bill that she received for DOS 12/18/16. She states that most of the bill looks like it is for labs. Please advise, thank you!  Call pt @ [phone]

## 2017-02-25 DIAGNOSIS — L308 Other specified dermatitis: Secondary | ICD-10-CM | POA: Diagnosis not present

## 2017-02-25 DIAGNOSIS — L986 Other infiltrative disorders of the skin and subcutaneous tissue: Secondary | ICD-10-CM | POA: Diagnosis not present

## 2017-05-28 ENCOUNTER — Other Ambulatory Visit: Payer: Self-pay | Admitting: Internal Medicine

## 2017-06-17 ENCOUNTER — Ambulatory Visit: Payer: 59 | Admitting: Internal Medicine

## 2017-07-01 ENCOUNTER — Telehealth: Payer: Self-pay | Admitting: Internal Medicine

## 2017-07-01 ENCOUNTER — Other Ambulatory Visit: Payer: Self-pay | Admitting: Internal Medicine

## 2017-07-01 DIAGNOSIS — Z1211 Encounter for screening for malignant neoplasm of colon: Secondary | ICD-10-CM

## 2017-07-01 NOTE — Telephone Encounter (Signed)
Called patient states that was sent message on my chart for colonoscopy. She is not having any symptoms or issues just received reminder. She also states that you had offered chantix in the past to quit smoking but she was not ready to do it then. Now she is. I informed that she will need follow up app but she wanted me to see if it could be done without having to come in due to bill she received from her 2-28 appointment.

## 2017-07-01 NOTE — Telephone Encounter (Signed)
Pt called and would like an order for colonoscopy. Pt also would like to try to quit smoking and start using the Chantix. Please advise, thank you!  Call pt @ [phone]

## 2017-07-02 NOTE — Telephone Encounter (Signed)
I have not seen her since 11/2016.  Will need to see her for regular f/u and to discuss chantix.  Also, I will place the order for the referral for colonoscopy, but need to know who she prefers to see.

## 2017-07-02 NOTE — Telephone Encounter (Signed)
Called patient follow up made. She would like referral to [facility]. She will need app on Tuesday Wednesday or Thursday

## 2017-07-03 NOTE — Telephone Encounter (Signed)
Order placed for referral to GI 

## 2017-07-29 ENCOUNTER — Telehealth: Payer: Self-pay | Admitting: Internal Medicine

## 2017-07-29 ENCOUNTER — Encounter: Payer: Self-pay | Admitting: Internal Medicine

## 2017-07-29 ENCOUNTER — Other Ambulatory Visit: Payer: Self-pay | Admitting: Internal Medicine

## 2017-07-29 NOTE — Telephone Encounter (Signed)
I looked at this and it looks like a note was closed prior to making her an appt.  She requested rx for chantix and a GI referral and she got the referral placed, but no appt according to the chart.  I am not sure where we want to try and place her?

## 2017-07-29 NOTE — Telephone Encounter (Signed)
Pt called and asked to speak with Dr. Nicki Reaper in regards to an appt that she was supposed to have today. She is highly annoyed and needs these medications and Dr. Nicki Reaper just has to sign off on them. Please advise, thank you!  Call pt @ [phone]

## 2017-07-29 NOTE — Telephone Encounter (Signed)
It appears appt was never scheduled.  The only place I can work her in is Friday (08/01/17) -  12:30.    Will have to see her during lunch.

## 2017-07-29 NOTE — Telephone Encounter (Signed)
I called the patient she is aware of her appointment for Friday 10.12.18 @ 12:30.

## 2017-08-01 ENCOUNTER — Ambulatory Visit (INDEPENDENT_AMBULATORY_CARE_PROVIDER_SITE_OTHER): Payer: 59 | Admitting: Internal Medicine

## 2017-08-01 ENCOUNTER — Encounter: Payer: Self-pay | Admitting: Internal Medicine

## 2017-08-01 DIAGNOSIS — Z716 Tobacco abuse counseling: Secondary | ICD-10-CM

## 2017-08-01 DIAGNOSIS — I10 Essential (primary) hypertension: Secondary | ICD-10-CM | POA: Diagnosis not present

## 2017-08-01 DIAGNOSIS — F439 Reaction to severe stress, unspecified: Secondary | ICD-10-CM

## 2017-08-01 DIAGNOSIS — R739 Hyperglycemia, unspecified: Secondary | ICD-10-CM | POA: Diagnosis not present

## 2017-08-01 DIAGNOSIS — E669 Obesity, unspecified: Secondary | ICD-10-CM

## 2017-08-01 DIAGNOSIS — E7800 Pure hypercholesterolemia, unspecified: Secondary | ICD-10-CM

## 2017-08-01 DIAGNOSIS — Z23 Encounter for immunization: Secondary | ICD-10-CM | POA: Diagnosis not present

## 2017-08-01 MED ORDER — BUPROPION HCL ER (XL) 150 MG PO TB24: ORAL_TABLET | ORAL | 2 refills | Status: DC

## 2017-08-01 MED ORDER — ROSUVASTATIN CALCIUM 5 MG PO TABS: 5.0000 mg | ORAL_TABLET | Freq: Every day | ORAL | 1 refills | Status: DC

## 2017-08-01 NOTE — Progress Notes (Signed)
[name], female   DOB: 1966/09/25, 51 y.o.   MRN: [id number]   Subjective:    [name], female    DOB: 05/29/1966, 51 y.o.   MRN: [id number]  HPI  Patient here as a work in to discuss smoking cessation.  Her brother was recently diagnosed with lung cancer.  She is interested in quitting smoking.  Discussed treatment options.  Discussed chantix and wellbutrin.  She is ready to quit.  We discussed the need for her to find something else to do with her hands and to find another form of relaxation.  Discussed medications and possible risk and side effects of the medications.  Increased stress with family issues, including her brother's recent diagnosis.  No chest pain.  No sob.  No acid reflux.  Tolerating crestor.     Past Medical History:  Diagnosis Date  . Hyperlipidemia   . Hypertension    Past Surgical History:  Procedure Laterality Date  . VAGINAL DELIVERY  1989   episiotomy, Dr. Arsenio Loader   Family History  Problem Relation Age of Onset  . Diabetes Mother   . Dementia Mother   . Stroke Father 69  . Hypertension Sister   . Thyroid disease Sister   . Diabetes Sister   . Hypertension Brother   . Diabetes Brother   . Hypertension Sister   . Hypertension Brother   . Hypertension Brother   . Hypertension Brother   . Hypertension Sister   . Breast cancer Neg Hx    Social History   Social History  . Marital status: Single    Spouse name: N/A  . Number of children: N/A  . Years of education: N/A   Social History Main Topics  . Smoking status: Current Some Day Smoker    Types: Cigarettes  . Smokeless tobacco: Never Used  . Alcohol use 0.0 oz/week  . Drug use: No  . Sexual activity: Not Asked   Other Topics Concern  . None   Social History Narrative   Lives in [place]. 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      Diet: regular diet, limits fried food   Exercise: walks goal 56mn daily     Outpatient Encounter Prescriptions as of 08/01/2017  Medication Sig  . hydrochlorothiazide (HYDRODIURIL) 25 MG tablet TAKE 1 TABLET (25 MG TOTAL) BY MOUTH DAILY.  . hydrochlorothiazide (HYDRODIURIL) 25 MG tablet TAKE 1 TABLET (25 MG TOTAL) BY MOUTH DAILY.  .Marland Kitchenibuprofen (ADVIL,MOTRIN) 200 MG tablet Take 200 mg by mouth every 6 (six) hours as needed.  . rosuvastatin (CRESTOR) 5 MG tablet Take 1 tablet (5 mg total) by mouth daily.  . [DISCONTINUED] rosuvastatin (CRESTOR) 5 MG tablet TAKE 1 TABLET BY MOUTH DAILY.  .Marland KitchenbuPROPion (WELLBUTRIN XL) 150 MG 24 hr tablet Take one tablet q day for 10 days and then one bid   No facility-administered encounter medications on file as of 08/01/2017.     Review of Systems  Constitutional: Negative for appetite change and unexpected weight change.  HENT: Negative for congestion and sinus pressure.   Respiratory: Negative for cough, chest tightness and shortness of breath.   Cardiovascular: Negative for chest pain, palpitations and leg swelling.  Gastrointestinal: Negative for abdominal pain, diarrhea, nausea and vomiting.  Skin: Negative for color change and rash.  Neurological: Negative for dizziness, light-headedness and headaches.  Psychiatric/Behavioral: Negative for agitation and dysphoric mood.       Objective:  Physical Exam  Constitutional: She appears well-developed and well-nourished. No distress.  HENT:  Nose: Nose normal.  Mouth/Throat: Oropharynx is clear and moist.  Neck: Neck supple. No thyromegaly present.  Cardiovascular: Normal rate and regular rhythm.   Pulmonary/Chest: Breath sounds normal. No respiratory distress. She has no wheezes.  Abdominal: Soft. Bowel sounds are normal. There is no tenderness.  Musculoskeletal: She exhibits no edema or tenderness.  Lymphadenopathy:    She has no cervical adenopathy.  Skin: No rash noted. No erythema.  Psychiatric: She has a normal mood and affect. Her behavior is normal.    BP  138/78 (BP Location: Left Arm, Patient Position: Sitting, Cuff Size: Normal)   Pulse 73   Temp 98.4 F (36.9 C) (Oral)   Resp 20   Wt 243 lb 8 oz (110.5 kg)   SpO2 99%   BMI 33.96 kg/m  Wt Readings from Last 3 Encounters:  08/01/17 243 lb 8 oz (110.5 kg)  12/18/16 245 lb 12.8 oz (111.5 kg)  11/05/16 245 lb 9.6 oz (111.4 kg)     Lab Results  Component Value Date   WBC 6.7 12/18/2016   HGB 14.3 12/18/2016   HCT 42.4 12/18/2016   PLT 254.0 12/18/2016   GLUCOSE 95 12/18/2016   CHOL 255 (H) 12/18/2016   TRIG 109.0 12/18/2016   HDL 66.50 12/18/2016   LDLCALC 166 (H) 12/18/2016   ALT 28 01/28/2017   AST 21 01/28/2017   NA 138 12/18/2016   K 4.2 12/18/2016   CL 106 12/18/2016   CREATININE 0.76 12/18/2016   BUN 9 12/18/2016   CO2 26 12/18/2016   TSH 0.58 12/18/2016   HGBA1C 6.1 12/18/2016    Mm Screening Breast Tomo Bilateral  Result Date: 02/06/2017 CLINICAL DATA:  Screening. EXAM: 2D DIGITAL SCREENING BILATERAL MAMMOGRAM WITH CAD AND ADJUNCT TOMO COMPARISON:  Previous exam(s). ACR Breast Density Category b: There are scattered areas of fibroglandular density. FINDINGS: There are no findings suspicious for malignancy. Images were processed with CAD. IMPRESSION: No mammographic evidence of malignancy. A result letter of this screening mammogram will be mailed directly to the patient. RECOMMENDATION: Screening mammogram in one year. (Code:SM-B-01Y) BI-RADS CATEGORY  1: Negative. Electronically Signed   By: Lajean Manes M.D.   On: 02/06/2017 10:24       Assessment & Plan:   Problem List Items Addressed This Visit    Encounter for smoking cessation counseling    Discussed the need to quit smoking.  Discussed treatment options.  She wants to start wellbutrin.  Discussed the medication and possible side effects of medication.  Set quit date.  Follow.        Hypercholesterolemia    On crestor.  Tolerating.  Low cholesterol diet and exercise.  Follow lipid panel and liver  function tests.        Relevant Medications   rosuvastatin (CRESTOR) 5 MG tablet   Other Relevant Orders   Lipid panel   Hyperglycemia    Low carb diet and exercise.  Follow met b and a1c.        Relevant Orders   Hemoglobin A1c   Hypertension    Blood pressure under good control.  Continue same medication regimen.  Follow pressures.  Follow metabolic panel.        Relevant Medications   rosuvastatin (CRESTOR) 5 MG tablet   Other Relevant Orders   Hepatic function panel   Basic metabolic panel   Obesity (BMI 30-39.9)    Diet and exercise.  Stress    Increased stress as outlined.  Discussed with her today.  Will start wellbutrin.  This may help with the increased stress as well.         Other Visit Diagnoses    Need for immunization against influenza       Relevant Orders   Flu Vaccine QUAD 36+ mos IM (Completed)       Einar Pheasant, MD'

## 2017-08-03 ENCOUNTER — Encounter: Payer: Self-pay | Admitting: Internal Medicine

## 2017-08-03 NOTE — Assessment & Plan Note (Signed)
Blood pressure under good control.  Continue same medication regimen.  Follow pressures.  Follow metabolic panel.   

## 2017-08-03 NOTE — Assessment & Plan Note (Signed)
On crestor.  Tolerating.  Low cholesterol diet and exercise.  Follow lipid panel and liver function tests.   

## 2017-08-03 NOTE — Assessment & Plan Note (Signed)
Increased stress as outlined.  Discussed with her today.  Will start wellbutrin.  This may help with the increased stress as well.

## 2017-08-03 NOTE — Assessment & Plan Note (Signed)
Diet and exercise.   

## 2017-08-03 NOTE — Assessment & Plan Note (Signed)
Low carb diet and exercise.  Follow met b and a1c.   

## 2017-08-03 NOTE — Assessment & Plan Note (Signed)
Discussed the need to quit smoking.  Discussed treatment options.  She wants to start wellbutrin.  Discussed the medication and possible side effects of medication.  Set quit date.  Follow.

## 2017-08-15 ENCOUNTER — Other Ambulatory Visit (INDEPENDENT_AMBULATORY_CARE_PROVIDER_SITE_OTHER): Payer: 59

## 2017-08-15 DIAGNOSIS — I10 Essential (primary) hypertension: Secondary | ICD-10-CM | POA: Diagnosis not present

## 2017-08-15 DIAGNOSIS — E7800 Pure hypercholesterolemia, unspecified: Secondary | ICD-10-CM

## 2017-08-15 DIAGNOSIS — R739 Hyperglycemia, unspecified: Secondary | ICD-10-CM

## 2017-08-15 LAB — LIPID PANEL
Cholesterol: 199 mg/dL (ref 0–200)
HDL: 61.6 mg/dL (ref >39.00)
LDL Cholesterol: 123 mg/dL — ABNORMAL HIGH (ref 0–99)
NONHDL: 137.68
Total CHOL/HDL Ratio: 3
Triglycerides: 71 mg/dL (ref 0.0–149.0)
VLDL: 14.2 mg/dL (ref 0.0–40.0)

## 2017-08-15 LAB — BASIC METABOLIC PANEL
BUN: 15 mg/dL (ref 6–23)
CALCIUM: 9.6 mg/dL (ref 8.4–10.5)
CHLORIDE: 106 meq/L (ref 96–112)
CO2: 27 meq/L (ref 19–32)
Creatinine, Ser: 0.8 mg/dL (ref 0.40–1.20)
GFR: 97.53 mL/min (ref >60.00)
Glucose, Bld: 99 mg/dL (ref 70–99)
Potassium: 4.1 mEq/L (ref 3.5–5.1)
SODIUM: 140 meq/L (ref 135–145)

## 2017-08-15 LAB — HEPATIC FUNCTION PANEL
ALT: 27 U/L (ref 0–35)
AST: 22 U/L (ref 0–37)
Albumin: 4.1 g/dL (ref 3.5–5.2)
Alkaline Phosphatase: 57 U/L (ref 39–117)
BILIRUBIN DIRECT: 0.1 mg/dL (ref 0.0–0.3)
BILIRUBIN TOTAL: 0.4 mg/dL (ref 0.2–1.2)
Total Protein: 7.3 g/dL (ref 6.0–8.3)

## 2017-08-15 LAB — HEMOGLOBIN A1C: Hgb A1c MFr Bld: 6.1 % (ref 4.6–6.5)

## 2017-08-18 DIAGNOSIS — H5213 Myopia, bilateral: Secondary | ICD-10-CM | POA: Diagnosis not present

## 2017-08-18 MED ORDER — ROSUVASTATIN CALCIUM 10 MG PO TABS: 10.0000 mg | ORAL_TABLET | Freq: Every day | ORAL | 0 refills | Status: DC

## 2017-08-18 NOTE — Addendum Note (Signed)
Addended by: Francella Solian on: 08/18/2017 01:18 PM   Modules accepted: Orders

## 2017-09-04 ENCOUNTER — Telehealth: Payer: Self-pay | Admitting: Internal Medicine

## 2017-09-04 NOTE — Telephone Encounter (Signed)
Copied from [place] [id number]. Topic: Quick Communication - See Telephone Encounter >> Sep 04, 2017  7:24 PM Ivar Drape wrote: CRM for notification. See Telephone encounter for:  09/04/17. Patient would like the nurse that administered her flu shot in October to give her a call at [phone]. It's about being put in corrective action for not turning in her documentation before the deadline.

## 2017-09-05 ENCOUNTER — Telehealth: Payer: Self-pay

## 2017-09-05 NOTE — Telephone Encounter (Signed)
Copied from [place] [id number]. Topic: Quick Communication - See Telephone Encounter >> Sep 04, 2017  7:24 PM Ivar Drape wrote: CRM for notification. See Telephone encounter for:  09/04/17. >> Sep 05, 2017  1:45 PM Tye [place] wrote: PT says she needing that letter and if possible fax the letter to [phone]

## 2017-09-05 NOTE — Telephone Encounter (Signed)
Talked with patient and advised her if letter from office will help I would be glad to write letter verifying the miss understanding. Patient staed she would contact me if letter needed.

## 2017-09-08 NOTE — Telephone Encounter (Signed)
letter faxed as requested.

## 2017-09-10 ENCOUNTER — Telehealth: Payer: Self-pay | Admitting: Internal Medicine

## 2017-09-10 NOTE — Telephone Encounter (Signed)
Copied from [place]. Topic: Quick Communication - See Telephone Encounter >> Sep 10, 2017  9:32 AM Boyd Kerbs wrote: CRM for notification. See Telephone encounter for:  Dr. Nicki Reaper is primary, jackie called needing Depo-prova refill, [place]. On [address]. Has sent 2 requests and no response.   Please ask Kerin Salen call patient, she is needing to ask a follow up question  09/10/17.

## 2017-09-10 NOTE — Telephone Encounter (Signed)
Called patient you have never given in the past but she would like for you to start. Informed that you are out off office until Monday. As far as message for Juliann Pulse she has spoke to her already.

## 2017-09-10 NOTE — Telephone Encounter (Signed)
Please call pharmacy.  Reviewed med history,  I do not see where we have filled this medication - on medication history.  Confirm correct pt and check with pharmacy.  Also, see what pt needed regarding her question for [facility].  See phone message.

## 2017-09-10 NOTE — Telephone Encounter (Signed)
Please advise 

## 2017-09-10 NOTE — Telephone Encounter (Signed)
Do you refill this?

## 2017-09-14 NOTE — Telephone Encounter (Signed)
Need to know who has been refilling this medication.  I have no documentation that she has been seeing anyone else.  Please call pharmacy and see who has been refilling the medication.

## 2017-09-15 NOTE — Telephone Encounter (Signed)
Left message or patient to return my call.

## 2017-10-02 DIAGNOSIS — Z01419 Encounter for gynecological examination (general) (routine) without abnormal findings: Secondary | ICD-10-CM | POA: Diagnosis not present

## 2017-10-02 DIAGNOSIS — Z309 Encounter for contraceptive management, unspecified: Secondary | ICD-10-CM | POA: Diagnosis not present

## 2017-10-02 DIAGNOSIS — Z30017 Encounter for initial prescription of implantable subdermal contraceptive: Secondary | ICD-10-CM | POA: Diagnosis not present

## 2017-10-02 DIAGNOSIS — Z3009 Encounter for other general counseling and advice on contraception: Secondary | ICD-10-CM | POA: Diagnosis not present

## 2017-10-10 ENCOUNTER — Ambulatory Visit: Payer: 59 | Admitting: Internal Medicine

## 2017-10-10 ENCOUNTER — Encounter: Payer: Self-pay | Admitting: Internal Medicine

## 2017-10-11 NOTE — Telephone Encounter (Signed)
Reviewed my chart message.  Please take her off the schedule so that she does not get charged for no show.  Also, she needs a f/u appt in 2 months.  Please schedule.  Thanks    Dr Nicki Reaper

## 2017-10-15 NOTE — Telephone Encounter (Signed)
Please advise 

## 2017-10-29 DIAGNOSIS — Z1211 Encounter for screening for malignant neoplasm of colon: Secondary | ICD-10-CM | POA: Diagnosis not present

## 2017-10-30 NOTE — Telephone Encounter (Signed)
Left patient a message to give me a call back to schedule her an appointment on 1.22.19 @ 12:00.

## 2017-11-11 ENCOUNTER — Ambulatory Visit: Payer: 59 | Admitting: Internal Medicine

## 2017-11-28 ENCOUNTER — Other Ambulatory Visit: Payer: Self-pay | Admitting: Internal Medicine

## 2017-12-02 DIAGNOSIS — D128 Benign neoplasm of rectum: Secondary | ICD-10-CM | POA: Diagnosis not present

## 2017-12-02 DIAGNOSIS — Z1211 Encounter for screening for malignant neoplasm of colon: Secondary | ICD-10-CM | POA: Diagnosis not present

## 2017-12-02 DIAGNOSIS — K620 Anal polyp: Secondary | ICD-10-CM | POA: Diagnosis not present

## 2017-12-02 DIAGNOSIS — K5730 Diverticulosis of large intestine without perforation or abscess without bleeding: Secondary | ICD-10-CM | POA: Diagnosis not present

## 2017-12-02 DIAGNOSIS — K640 First degree hemorrhoids: Secondary | ICD-10-CM | POA: Diagnosis not present

## 2017-12-02 DIAGNOSIS — K621 Rectal polyp: Secondary | ICD-10-CM | POA: Diagnosis not present

## 2017-12-02 DIAGNOSIS — K648 Other hemorrhoids: Secondary | ICD-10-CM | POA: Diagnosis not present

## 2017-12-11 ENCOUNTER — Encounter: Payer: Self-pay | Admitting: Internal Medicine

## 2017-12-11 ENCOUNTER — Ambulatory Visit: Payer: 59 | Admitting: Internal Medicine

## 2017-12-11 DIAGNOSIS — E669 Obesity, unspecified: Secondary | ICD-10-CM | POA: Diagnosis not present

## 2017-12-11 DIAGNOSIS — I10 Essential (primary) hypertension: Secondary | ICD-10-CM | POA: Diagnosis not present

## 2017-12-11 DIAGNOSIS — E0789 Other specified disorders of thyroid: Secondary | ICD-10-CM

## 2017-12-11 DIAGNOSIS — F439 Reaction to severe stress, unspecified: Secondary | ICD-10-CM | POA: Diagnosis not present

## 2017-12-11 DIAGNOSIS — E7800 Pure hypercholesterolemia, unspecified: Secondary | ICD-10-CM

## 2017-12-11 DIAGNOSIS — R739 Hyperglycemia, unspecified: Secondary | ICD-10-CM | POA: Diagnosis not present

## 2017-12-11 DIAGNOSIS — Z716 Tobacco abuse counseling: Secondary | ICD-10-CM | POA: Diagnosis not present

## 2017-12-11 NOTE — Progress Notes (Signed)
[name], female   DOB: 1966-03-30, 52 y.o.   MRN: [id number]   Subjective:    [name], female    DOB: 1966-10-02, 52 y.o.   MRN: [id number]  HPI  Patient here for a scheduled follow up.  She reports she is doing relatively well.  Plans to start walking more.  No chest pain.  No sob.  No acid reflux.  No abdominal pain.  Bowels moving.  Has nexplanon.  Just had placed 10/02/17.  Handling stress.  Has good support.  Does not feel needs anything more at this time.  Discussed the need to quit smoking.  Has wellbutrin.  Has not started.     Past Medical History:  Diagnosis Date  . Hyperlipidemia   . Hypertension    Past Surgical History:  Procedure Laterality Date  . VAGINAL DELIVERY  1989   episiotomy, Dr. Arsenio Loader   Family History  Problem Relation Age of Onset  . Diabetes Mother   . Dementia Mother   . Stroke Father 73  . Hypertension Sister   . Thyroid disease Sister   . Diabetes Sister   . Hypertension Brother   . Diabetes Brother   . Hypertension Sister   . Hypertension Brother   . Hypertension Brother   . Hypertension Brother   . Hypertension Sister   . Breast cancer Neg Hx    Social History   Socioeconomic History  . Marital status: Single    Spouse name: None  . Number of children: None  . Years of education: None  . Highest education level: None  Social Needs  . Financial resource strain: None  . Food insecurity - worry: None  . Food insecurity - inability: None  . Transportation needs - medical: None  . Transportation needs - non-medical: None  Occupational History  . None  Tobacco Use  . Smoking status: Current Some Day Smoker    Types: Cigarettes  . Smokeless tobacco: Never Used  Substance and Sexual Activity  . Alcohol use: Yes    Alcohol/week: 0.0 oz  . Drug use: No  . Sexual activity: None  Other Topics Concern  . None  Social History Narrative   Lives in [place]. 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 2 grandchildren, 1 son.      Diet: regular diet, limits fried food   Exercise: walks goal 60mn daily    Outpatient Encounter Medications as of 12/11/2017  Medication Sig  . buPROPion (WELLBUTRIN XL) 150 MG 24 hr tablet Take one tablet q day for 10 days and then one bid  . hydrochlorothiazide (HYDRODIURIL) 25 MG tablet TAKE 1 TABLET (25 MG TOTAL) BY MOUTH DAILY.  . hydrochlorothiazide (HYDRODIURIL) 25 MG tablet TAKE 1 TABLET (25 MG TOTAL) BY MOUTH DAILY.  .Marland Kitchenibuprofen (ADVIL,MOTRIN) 200 MG tablet Take 200 mg by mouth every 6 (six) hours as needed.  . rosuvastatin (CRESTOR) 10 MG tablet TAKE 1 TABLET (10 MG TOTAL) BY MOUTH DAILY.   No facility-administered encounter medications on file as of 12/11/2017.     Review of Systems  Constitutional: Negative for appetite change and unexpected weight change.  HENT: Negative for congestion and sinus pressure.   Respiratory: Negative for cough, chest tightness and shortness of breath.   Cardiovascular: Negative for chest pain, palpitations and leg swelling.  Gastrointestinal: Negative for abdominal pain, diarrhea, nausea and vomiting.  Genitourinary: Negative for difficulty urinating and dysuria.  Musculoskeletal: Negative for joint swelling and myalgias.  Skin:  Negative for color change and rash.  Neurological: Negative for dizziness, light-headedness and headaches.  Psychiatric/Behavioral: Negative for agitation and dysphoric mood.       Objective:    Physical Exam  Constitutional: She appears well-developed and well-nourished. No distress.  HENT:  Nose: Nose normal.  Mouth/Throat: Oropharynx is clear and moist.  Neck: Neck supple. No thyromegaly present.  Cardiovascular: Normal rate and regular rhythm.  Pulmonary/Chest: Breath sounds normal. No respiratory distress. She has no wheezes.  Abdominal: Soft. Bowel sounds are normal. There is no tenderness.  Musculoskeletal: She exhibits no edema or tenderness.  Lymphadenopathy:     She has no cervical adenopathy.  Skin: No rash noted. No erythema.  Psychiatric: She has a normal mood and affect. Her behavior is normal.    BP 134/80 (BP Location: Left Arm, Patient Position: Sitting, Cuff Size: Large)   Pulse 83   Temp 99.8 F (37.7 C) (Oral)   Resp 18   Wt 245 lb 12.8 oz (111.5 kg)   SpO2 99%   BMI 34.28 kg/m  Wt Readings from Last 3 Encounters:  12/11/17 245 lb 12.8 oz (111.5 kg)  08/01/17 243 lb 8 oz (110.5 kg)  12/18/16 245 lb 12.8 oz (111.5 kg)     Lab Results  Component Value Date   WBC 6.7 12/18/2016   HGB 14.3 12/18/2016   HCT 42.4 12/18/2016   PLT 254.0 12/18/2016   GLUCOSE 99 08/15/2017   CHOL 199 08/15/2017   TRIG 71.0 08/15/2017   HDL 61.60 08/15/2017   LDLCALC 123 (H) 08/15/2017   ALT 27 08/15/2017   AST 22 08/15/2017   NA 140 08/15/2017   K 4.1 08/15/2017   CL 106 08/15/2017   CREATININE 0.80 08/15/2017   BUN 15 08/15/2017   CO2 27 08/15/2017   TSH 0.58 12/18/2016   HGBA1C 6.1 08/15/2017    Mm Screening Breast Tomo Bilateral  Result Date: 02/06/2017 CLINICAL DATA:  Screening. EXAM: 2D DIGITAL SCREENING BILATERAL MAMMOGRAM WITH CAD AND ADJUNCT TOMO COMPARISON:  Previous exam(s). ACR Breast Density Category b: There are scattered areas of fibroglandular density. FINDINGS: There are no findings suspicious for malignancy. Images were processed with CAD. IMPRESSION: No mammographic evidence of malignancy. A result letter of this screening mammogram will be mailed directly to the patient. RECOMMENDATION: Screening mammogram in one year. (Code:SM-B-01Y) BI-RADS CATEGORY  1: Negative. Electronically Signed   By: Lajean Manes M.D.   On: 02/06/2017 10:24       Assessment & Plan:   Problem List Items Addressed This Visit    Encounter for smoking cessation counseling    Discussed again the need to quit smoking.  She has wellbutrin.  Plans to start.  Follow.        Hypercholesterolemia    On crestor.  Low cholesterol diet and exercise.   Follow lipid panel and liver function tests.        Relevant Orders   Hepatic function panel   Lipid panel   Hyperglycemia    Low carb diet and exercise.  Follow met b and a1c.        Relevant Orders   Hemoglobin A1c   Hypertension    Blood pressure under good control.  Continue same medication regimen.  Follow pressures.  Follow metabolic panel.        Relevant Orders   CBC with Differential/Platelet   TSH   Basic metabolic panel   Obesity (BMI 30-39.9)    Discussed diet and exercise.  Follow.  Stress    Increased stress as outlined.  Discussed with her today.  Will start wellbutrin.  Does not feel she needs anything more at this time.  Follow.        Thyroid fullness    Followed by Dr Eddie Dibbles.            Einar Pheasant, MD

## 2017-12-14 ENCOUNTER — Encounter: Payer: Self-pay | Admitting: Internal Medicine

## 2017-12-14 NOTE — Assessment & Plan Note (Signed)
Low carb diet and exercise.  Follow met b and a1c.   

## 2017-12-14 NOTE — Assessment & Plan Note (Signed)
On crestor.  Low cholesterol diet and exercise.  Follow lipid panel and liver function tests.   

## 2017-12-14 NOTE — Assessment & Plan Note (Signed)
Followed by Dr Paul.  

## 2017-12-14 NOTE — Assessment & Plan Note (Signed)
Discussed diet and exercise.  Follow.  

## 2017-12-14 NOTE — Assessment & Plan Note (Signed)
Discussed again the need to quit smoking.  She has wellbutrin.  Plans to start.  Follow.

## 2017-12-14 NOTE — Assessment & Plan Note (Signed)
Increased stress as outlined.  Discussed with her today.  Will start wellbutrin.  Does not feel she needs anything more at this time.  Follow.

## 2017-12-14 NOTE — Assessment & Plan Note (Signed)
Blood pressure under good control.  Continue same medication regimen.  Follow pressures.  Follow metabolic panel.   

## 2017-12-29 ENCOUNTER — Ambulatory Visit: Payer: Self-pay

## 2017-12-29 DIAGNOSIS — T7840XA Allergy, unspecified, initial encounter: Secondary | ICD-10-CM | POA: Diagnosis not present

## 2017-12-29 NOTE — Telephone Encounter (Signed)
Talked with patient UC full patient is going to [place] walk in or to ED.

## 2017-12-29 NOTE — Telephone Encounter (Signed)
Pt calling with c/o left upper lip edema and pt is noticing left facial edema. She contributes reaction to Wellbutrin. Denies pain or fever. + itching. Pt took a Benadryl this am and swelling is still present this afternoon. Denies edema anywhere else or rash. Advised pt to go to [facility] now. Pt states she will go.  Reason for Disposition . Swelling began after taking a drug  Answer Assessment - Initial Assessment Questions 1. ONSET: "When did the swelling start?" (e.g., minutes, hours, days)     0400 this am  2. SEVERITY: "How swollen is it?"     Left lip that is extending to the right side 3. ITCHING: "Is there any itching?" If so, ask: "How much?"   (Scale 1-10; mild, moderate or severe)     milld 4. PAIN: "Is the swelling painful to touch?" If so, ask: "How painful is it?"   (Scale 1-10; mild, moderate or severe)     no 5. CAUSE: "What do you think is causing the lip swelling?"     Wellbutrin 6. RECURRENT SYMPTOM: "Have you had lip swelling before?" If so, ask: "When was the last time?" "What happened that time?"     no 7. OTHER SYMPTOMS: "Do you have any other symptoms?" (e.g., toothache)     H/o indigestion 8. PREGNANCY: "Is there any chance you are pregnant?" "When was your last menstrual period?"     No no period  Answer Assessment - Initial Assessment Questions 1. ONSET: "When did the swelling start?" (e.g., minutes, hours, days)     This am 0400 2. LOCATION: "What part of the face is swollen?"     Top left of lip and is spreading to left face 3. SEVERITY: "How swollen is it?"     Top lip is swollen from left to right  And mild swelling to left face 4. ITCHING: "Is there any itching?" If so, ask: "How much?"   (Scale 1-10; mild, moderate or severe)     mild 5. PAIN: "Is the swelling painful to touch?" If so, ask: "How painful is it?"   (Scale 1-10; mild, moderate or severe)     No pain 6. FEVER: "Do you have a fever?" If so, ask: "What is it, how was it measured, and when did  it start?"      No fever 7. CAUSE: "What do you think is causing the face swelling?"     wellbutrin 8. RECURRENT SYMPTOM: "Have you had face swelling before?" If so, ask: "When was the last time?" "What happened that time?"     no 9. OTHER SYMPTOMS: "Do you have any other symptoms?" (e.g., toothache, leg swelling)     No except indigestion 10. PREGNANCY: "Is there any chance you are pregnant?" "When was your last menstrual period?"       No n/a  Protocols used: [place], LIP [facility]

## 2017-12-29 NOTE — Telephone Encounter (Signed)
FYI

## 2017-12-29 NOTE — Telephone Encounter (Signed)
With facial swelling, agree with evaluation now given facial swelling.

## 2018-02-02 ENCOUNTER — Other Ambulatory Visit (INDEPENDENT_AMBULATORY_CARE_PROVIDER_SITE_OTHER): Payer: 59

## 2018-02-02 DIAGNOSIS — E7800 Pure hypercholesterolemia, unspecified: Secondary | ICD-10-CM

## 2018-02-02 DIAGNOSIS — R739 Hyperglycemia, unspecified: Secondary | ICD-10-CM | POA: Diagnosis not present

## 2018-02-02 DIAGNOSIS — I10 Essential (primary) hypertension: Secondary | ICD-10-CM

## 2018-02-02 LAB — TSH: TSH: 1.15 u[IU]/mL (ref 0.35–4.50)

## 2018-02-02 LAB — CBC WITH DIFFERENTIAL/PLATELET
Basophils Absolute: 0 10*3/uL (ref 0.0–0.1)
Basophils Relative: 0.3 % (ref 0.0–3.0)
EOS PCT: 2.2 % (ref 0.0–5.0)
Eosinophils Absolute: 0.1 10*3/uL (ref 0.0–0.7)
HCT: 40.9 % (ref 36.0–46.0)
Hemoglobin: 13.9 g/dL (ref 12.0–15.0)
LYMPHS ABS: 1.8 10*3/uL (ref 0.7–4.0)
Lymphocytes Relative: 29.5 % (ref 12.0–46.0)
MCHC: 34 g/dL (ref 30.0–36.0)
MCV: 93.6 fl (ref 78.0–100.0)
MONOS PCT: 8.2 % (ref 3.0–12.0)
Monocytes Absolute: 0.5 10*3/uL (ref 0.1–1.0)
NEUTROS ABS: 3.7 10*3/uL (ref 1.4–7.7)
NEUTROS PCT: 59.8 % (ref 43.0–77.0)
PLATELETS: 239 10*3/uL (ref 150.0–400.0)
RBC: 4.37 Mil/uL (ref 3.87–5.11)
RDW: 13.8 % (ref 11.5–15.5)
WBC: 6.1 10*3/uL (ref 4.0–10.5)

## 2018-02-02 LAB — LIPID PANEL
Cholesterol: 179 mg/dL (ref 0–200)
HDL: 54.4 mg/dL (ref >39.00)
LDL Cholesterol: 109 mg/dL — ABNORMAL HIGH (ref 0–99)
NONHDL: 124.59
Total CHOL/HDL Ratio: 3
Triglycerides: 76 mg/dL (ref 0.0–149.0)
VLDL: 15.2 mg/dL (ref 0.0–40.0)

## 2018-02-02 LAB — HEPATIC FUNCTION PANEL
ALK PHOS: 59 U/L (ref 39–117)
ALT: 21 U/L (ref 0–35)
AST: 18 U/L (ref 0–37)
Albumin: 4.3 g/dL (ref 3.5–5.2)
BILIRUBIN DIRECT: 0.1 mg/dL (ref 0.0–0.3)
BILIRUBIN TOTAL: 0.3 mg/dL (ref 0.2–1.2)
Total Protein: 7.4 g/dL (ref 6.0–8.3)

## 2018-02-02 LAB — BASIC METABOLIC PANEL
BUN: 12 mg/dL (ref 6–23)
CHLORIDE: 102 meq/L (ref 96–112)
CO2: 26 mEq/L (ref 19–32)
Calcium: 9.5 mg/dL (ref 8.4–10.5)
Creatinine, Ser: 0.78 mg/dL (ref 0.40–1.20)
GFR: 100.23 mL/min (ref >60.00)
GLUCOSE: 122 mg/dL — AB (ref 70–99)
POTASSIUM: 4.5 meq/L (ref 3.5–5.1)
SODIUM: 136 meq/L (ref 135–145)

## 2018-02-02 LAB — HEMOGLOBIN A1C: Hgb A1c MFr Bld: 6.1 % (ref 4.6–6.5)

## 2018-02-04 ENCOUNTER — Other Ambulatory Visit: Payer: Self-pay

## 2018-02-04 MED ORDER — ROSUVASTATIN CALCIUM 20 MG PO TABS: 20.0000 mg | ORAL_TABLET | Freq: Every day | ORAL | 3 refills | Status: DC

## 2018-02-10 ENCOUNTER — Other Ambulatory Visit: Payer: Self-pay | Admitting: Internal Medicine

## 2018-03-12 ENCOUNTER — Ambulatory Visit: Payer: 59 | Admitting: Internal Medicine

## 2018-03-19 ENCOUNTER — Telehealth: Payer: Self-pay

## 2018-03-19 NOTE — Telephone Encounter (Signed)
I spoke with the patient she does have the understanding that she will have to pay her deductible before her insurance will cover her lab work.

## 2018-03-19 NOTE — Telephone Encounter (Signed)
Copied from [place] [phone] [phone]. Topic: Bill or Statement - Patient/Guarantor Inquiry >> Mar 19, 2018 12:16 PM Paula Wallace B wrote: Pt has a question about DOS 02/02/18. She came in for her CPE labs and has received a bill she has contacted [employer] [facility] and they have redirected her to the office

## 2018-04-09 ENCOUNTER — Other Ambulatory Visit: Payer: Self-pay | Admitting: Internal Medicine

## 2018-07-08 ENCOUNTER — Encounter: Payer: Self-pay | Admitting: Internal Medicine

## 2018-07-08 ENCOUNTER — Ambulatory Visit: Payer: 59 | Admitting: Internal Medicine

## 2018-07-08 ENCOUNTER — Encounter

## 2018-07-08 VITALS — BP 136/78 | HR 81 | Temp 98.1°F | Resp 18 | Wt 248.2 lb

## 2018-07-08 DIAGNOSIS — R739 Hyperglycemia, unspecified: Secondary | ICD-10-CM

## 2018-07-08 DIAGNOSIS — Z716 Tobacco abuse counseling: Secondary | ICD-10-CM | POA: Diagnosis not present

## 2018-07-08 DIAGNOSIS — E7800 Pure hypercholesterolemia, unspecified: Secondary | ICD-10-CM | POA: Diagnosis not present

## 2018-07-08 DIAGNOSIS — I10 Essential (primary) hypertension: Secondary | ICD-10-CM

## 2018-07-08 DIAGNOSIS — E0789 Other specified disorders of thyroid: Secondary | ICD-10-CM | POA: Diagnosis not present

## 2018-07-08 DIAGNOSIS — Z1231 Encounter for screening mammogram for malignant neoplasm of breast: Secondary | ICD-10-CM | POA: Diagnosis not present

## 2018-07-08 DIAGNOSIS — Z1239 Encounter for other screening for malignant neoplasm of breast: Secondary | ICD-10-CM

## 2018-07-08 DIAGNOSIS — F439 Reaction to severe stress, unspecified: Secondary | ICD-10-CM | POA: Diagnosis not present

## 2018-07-08 LAB — LIPID PANEL
CHOLESTEROL: 178 mg/dL (ref 0–200)
HDL: 55.9 mg/dL (ref >39.00)
LDL Cholesterol: 104 mg/dL — ABNORMAL HIGH (ref 0–99)
NonHDL: 122.07
Total CHOL/HDL Ratio: 3
Triglycerides: 90 mg/dL (ref 0.0–149.0)
VLDL: 18 mg/dL (ref 0.0–40.0)

## 2018-07-08 LAB — BASIC METABOLIC PANEL
BUN: 11 mg/dL (ref 6–23)
CALCIUM: 9.7 mg/dL (ref 8.4–10.5)
CO2: 30 mEq/L (ref 19–32)
CREATININE: 0.72 mg/dL (ref 0.40–1.20)
Chloride: 102 mEq/L (ref 96–112)
GFR: 109.75 mL/min (ref >60.00)
Glucose, Bld: 84 mg/dL (ref 70–99)
Potassium: 4 mEq/L (ref 3.5–5.1)
Sodium: 137 mEq/L (ref 135–145)

## 2018-07-08 LAB — HEPATIC FUNCTION PANEL
ALT: 25 U/L (ref 0–35)
AST: 20 U/L (ref 0–37)
Albumin: 4.2 g/dL (ref 3.5–5.2)
Alkaline Phosphatase: 57 U/L (ref 39–117)
BILIRUBIN DIRECT: 0.1 mg/dL (ref 0.0–0.3)
BILIRUBIN TOTAL: 0.5 mg/dL (ref 0.2–1.2)
Total Protein: 6.7 g/dL (ref 6.0–8.3)

## 2018-07-08 LAB — HEMOGLOBIN A1C: Hgb A1c MFr Bld: 6.2 % (ref 4.6–6.5)

## 2018-07-08 MED ORDER — BUPROPION HCL ER (XL) 150 MG PO TB24: ORAL_TABLET | ORAL | 2 refills | Status: DC

## 2018-07-08 NOTE — Progress Notes (Signed)
[name], female   DOB: 1966-06-09, 52 y.o.   MRN: [id number]   Subjective:    [name], female    DOB: [birth year], 52 y.o.   MRN: [id number]  HPI  Patient here for a scheduled follow up.  She reports increased stress. Discussed with her today.  Previously on wellbutrin to help quit smoking.  Felt it also helped to level things out.  Ran out of medication.  Not taking now.  Wants to restart.  Discussed the need to stop smoking.  No chest pain.  No sob.  No acid reflux.  No abdominal pain.  Bowels moving.  States blood pressure is averaging 130s/70-80.  Does report leg cramps.  Feels related to crestor.     Past Medical History:  Diagnosis Date  . Hyperlipidemia   . Hypertension    Past Surgical History:  Procedure Laterality Date  . VAGINAL DELIVERY  1989   episiotomy, Dr. Arsenio Loader   Family History  Problem Relation Age of Onset  . Diabetes Mother   . Dementia Mother   . Stroke Father 9  . Hypertension Sister   . Thyroid disease Sister   . Diabetes Sister   . Hypertension Brother   . Diabetes Brother   . Hypertension Sister   . Hypertension Brother   . Hypertension Brother   . Hypertension Brother   . Hypertension Sister   . Breast cancer Neg Hx    Social History   Socioeconomic History  . Marital status: Single    Spouse name: Not on file  . Number of children: Not on file  . Years of education: Not on file  . Highest education level: Not on file  Occupational History  . Not on file  Social Needs  . Financial resource strain: Not on file  . Food insecurity:    Worry: Not on file    Inability: Not on file  . Transportation needs:    Medical: Not on file    Non-medical: Not on file  Tobacco Use  . Smoking status: Current Some Day Smoker    Types: Cigarettes  . Smokeless tobacco: Never Used  Substance and Sexual Activity  . Alcohol use: Yes    Alcohol/week: 0.0 standard drinks  . Drug use: No  . Sexual activity:  Not on file  Lifestyle  . Physical activity:    Days per week: Not on file    Minutes per session: Not on file  . Stress: Not on file  Relationships  . Social connections:    Talks on phone: Not on file    Gets together: Not on file    Attends religious service: Not on file    Active member of club or organization: Not on file    Attends meetings of clubs or organizations: Not on file    Relationship status: Not on file  Other Topics Concern  . Not on file  Social History Narrative   Lives in [place]. 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      Diet: regular diet, limits fried food   Exercise: walks goal 58mn daily    Outpatient Encounter Medications as of 07/08/2018  Medication Sig  . buPROPion (WELLBUTRIN XL) 150 MG 24 hr tablet TAKE 1 TABLET BY MOUTH ONCE DAILY X 10 DAYS, THEN TAKE 1 TABLET BY MOUTH TWICE DAILY  . hydrochlorothiazide (HYDRODIURIL) 25 MG tablet TAKE 1 TABLET BY MOUTH DAILY  . [DISCONTINUED] buPROPion ([facility]  XL) 150 MG 24 hr tablet TAKE 1 TABLET BY MOUTH ONCE DAILY X 10 DAYS, THEN TAKE 1 TABLET BY MOUTH TWICE DAILY  . [DISCONTINUED] hydrochlorothiazide (HYDRODIURIL) 25 MG tablet TAKE 1 TABLET (25 MG TOTAL) BY MOUTH DAILY.  . [DISCONTINUED] ibuprofen (ADVIL,MOTRIN) 200 MG tablet Take 200 mg by mouth every 6 (six) hours as needed.  . [DISCONTINUED] rosuvastatin (CRESTOR) 20 MG tablet Take 1 tablet (20 mg total) by mouth daily.   No facility-administered encounter medications on file as of 07/08/2018.     Review of Systems  Constitutional: Negative for appetite change and unexpected weight change.  HENT: Negative for congestion and sinus pressure.   Respiratory: Negative for cough, chest tightness and shortness of breath.   Cardiovascular: Negative for chest pain, palpitations and leg swelling.  Gastrointestinal: Negative for abdominal pain, diarrhea, nausea and vomiting.  Genitourinary: Negative for difficulty urinating and  dysuria.  Musculoskeletal: Negative for joint swelling.       Leg cramping as outlined.  No joint swelling.    Skin: Negative for color change and rash.  Neurological: Negative for dizziness, light-headedness and headaches.  Psychiatric/Behavioral: Negative for agitation and dysphoric mood.       Objective:     Blood pressure rechecked by me:  124-126/78-80  Physical Exam  Constitutional: She appears well-developed and well-nourished. No distress.  HENT:  Nose: Nose normal.  Mouth/Throat: Oropharynx is clear and moist.  Neck: Neck supple.  Cardiovascular: Normal rate and regular rhythm.  Pulmonary/Chest: Breath sounds normal. No respiratory distress. She has no wheezes.  Abdominal: Soft. Bowel sounds are normal. There is no tenderness.  Musculoskeletal: She exhibits no edema or tenderness.  Lymphadenopathy:    She has no cervical adenopathy.  Skin: No rash noted. No erythema.  Psychiatric: She has a normal mood and affect. Her behavior is normal.    BP 136/78 (BP Location: Left Arm, Patient Position: Sitting, Cuff Size: Large)   Pulse 81   Temp 98.1 F (36.7 C) (Oral)   Resp 18   Wt 248 lb 3.2 oz (112.6 kg)   SpO2 99%   BMI 34.62 kg/m  Wt Readings from Last 3 Encounters:  07/08/18 248 lb 3.2 oz (112.6 kg)  12/11/17 245 lb 12.8 oz (111.5 kg)  08/01/17 243 lb 8 oz (110.5 kg)     Lab Results  Component Value Date   WBC 6.1 02/02/2018   HGB 13.9 02/02/2018   HCT 40.9 02/02/2018   PLT 239.0 02/02/2018   GLUCOSE 84 07/08/2018   CHOL 178 07/08/2018   TRIG 90.0 07/08/2018   HDL 55.90 07/08/2018   LDLCALC 104 (H) 07/08/2018   ALT 25 07/08/2018   AST 20 07/08/2018   NA 137 07/08/2018   K 4.0 07/08/2018   CL 102 07/08/2018   CREATININE 0.72 07/08/2018   BUN 11 07/08/2018   CO2 30 07/08/2018   TSH 1.15 02/02/2018   HGBA1C 6.2 07/08/2018    Mm Screening Breast Tomo Bilateral  Result Date: 02/06/2017 CLINICAL DATA:  Screening. EXAM: 2D DIGITAL SCREENING  BILATERAL MAMMOGRAM WITH CAD AND ADJUNCT TOMO COMPARISON:  Previous exam(s). ACR Breast Density Category b: There are scattered areas of fibroglandular density. FINDINGS: There are no findings suspicious for malignancy. Images were processed with CAD. IMPRESSION: No mammographic evidence of malignancy. A result letter of this screening mammogram will be mailed directly to the patient. RECOMMENDATION: Screening mammogram in one year. (Code:SM-B-01Y) BI-RADS CATEGORY  1: Negative. Electronically Signed   By: Dedra Skeens.D.  On: 02/06/2017 10:24       Assessment & Plan:   Problem List Items Addressed This Visit    Encounter for smoking cessation counseling    Discussed the need to quit smoking.  Did not qualify for screening CT.  Discussed chantix.  Will restart wellbutrin.  Follow.        Hypercholesterolemia    Has been on crestor.  Leg cramping.  Question if related to crestor.  Will stop.  Call with update over the next 2-3 weeks.  Check routine labs.  Stay hydrated.        Relevant Orders   Hepatic function panel (Completed)   Lipid panel (Completed)   Hyperglycemia    Low carb diet and exercise.  Follow met b and a1c.        Relevant Orders   Hemoglobin A1c (Completed)   Hypertension    Blood pressure as outlined.  Continue same medication regimen.  Follow pressures.  Follow metabolic panel.        Relevant Orders   Basic metabolic panel (Completed)   Stress    Increased stress as outlined.  Discussed with her today.  Will restart wellbutrin.  Follow.        Thyroid fullness    Was followed by Dr Eddie Dibbles.  Discussed f/u.  She declines at this time.         Other Visit Diagnoses    Breast cancer screening    -  Primary   Relevant Orders   MM 3D SCREEN BREAST BILATERAL       Einar Pheasant, MD

## 2018-07-11 ENCOUNTER — Encounter: Payer: Self-pay | Admitting: Internal Medicine

## 2018-07-11 NOTE — Assessment & Plan Note (Signed)
Increased stress as outlined.  Discussed with her today.  Will restart wellbutrin.  Follow.

## 2018-07-11 NOTE — Assessment & Plan Note (Signed)
Has been on crestor.  Leg cramping.  Question if related to crestor.  Will stop.  Call with update over the next 2-3 weeks.  Check routine labs.  Stay hydrated.

## 2018-07-11 NOTE — Assessment & Plan Note (Signed)
Low carb diet and exercise.  Follow met b and a1c.   

## 2018-07-11 NOTE — Assessment & Plan Note (Signed)
Discussed the need to quit smoking.  Did not qualify for screening CT.  Discussed chantix.  Will restart wellbutrin.  Follow.

## 2018-07-11 NOTE — Assessment & Plan Note (Signed)
Blood pressure as outlined.  Continue same medication regimen.  Follow pressures.  Follow metabolic panel.  

## 2018-07-11 NOTE — Assessment & Plan Note (Signed)
Was followed by Dr Eddie Dibbles.  Discussed f/u.  She declines at this time.

## 2018-08-04 ENCOUNTER — Ambulatory Visit
Admission: RE | Admit: 2018-08-04 | Discharge: 2018-08-04 | Disposition: A | Discharge status: Home / Self Care | Payer: 59 | Source: Ambulatory Visit | Attending: Internal Medicine | Admitting: Internal Medicine

## 2018-08-04 DIAGNOSIS — Z1231 Encounter for screening mammogram for malignant neoplasm of breast: Secondary | ICD-10-CM | POA: Diagnosis not present

## 2018-08-04 DIAGNOSIS — Z1239 Encounter for other screening for malignant neoplasm of breast: Secondary | ICD-10-CM | POA: Diagnosis not present

## 2018-08-24 DIAGNOSIS — H5213 Myopia, bilateral: Secondary | ICD-10-CM | POA: Diagnosis not present

## 2018-08-24 DIAGNOSIS — H52223 Regular astigmatism, bilateral: Secondary | ICD-10-CM | POA: Diagnosis not present

## 2018-08-24 DIAGNOSIS — H524 Presbyopia: Secondary | ICD-10-CM | POA: Diagnosis not present

## 2018-09-22 ENCOUNTER — Ambulatory Visit (INDEPENDENT_AMBULATORY_CARE_PROVIDER_SITE_OTHER): Payer: 59 | Admitting: Internal Medicine

## 2018-09-22 ENCOUNTER — Encounter: Payer: Self-pay | Admitting: Internal Medicine

## 2018-09-22 VITALS — BP 138/84 | HR 90 | Temp 98.3°F | Resp 18 | Ht 71.0 in | Wt 248.0 lb

## 2018-09-22 DIAGNOSIS — F439 Reaction to severe stress, unspecified: Secondary | ICD-10-CM

## 2018-09-22 DIAGNOSIS — E7800 Pure hypercholesterolemia, unspecified: Secondary | ICD-10-CM

## 2018-09-22 DIAGNOSIS — R739 Hyperglycemia, unspecified: Secondary | ICD-10-CM

## 2018-09-22 DIAGNOSIS — Z0000 Encounter for general adult medical examination without abnormal findings: Secondary | ICD-10-CM

## 2018-09-22 DIAGNOSIS — I10 Essential (primary) hypertension: Secondary | ICD-10-CM | POA: Diagnosis not present

## 2018-09-22 DIAGNOSIS — M545 Low back pain, unspecified: Secondary | ICD-10-CM

## 2018-09-22 DIAGNOSIS — Z720 Tobacco use: Secondary | ICD-10-CM

## 2018-09-22 DIAGNOSIS — E0789 Other specified disorders of thyroid: Secondary | ICD-10-CM | POA: Diagnosis not present

## 2018-09-22 NOTE — Progress Notes (Signed)
[name], female   DOB: 06-05-1966, 52 y.o.   MRN: [id number]   Subjective:    [name], female    DOB: 05-Jul-1966, 52 y.o.   MRN: [id number]  HPI  Patient here for a physical exam. She reports she is doing relatively well.  Increased stress.  Discussed with her today.  Overall she feels she is handling things relatively well.  Has good support.  No chest pain.  No sob. No acid reflux. No abdominal pain.  Bowels moving.  No urine change.  Not watching her diet as well.  Plans to get more serious about her diet and exercise.  She is planning to start intermittent fasting.  Discussed with her today.  Wants to hold on cholesterol medication.  Wants to see what she can do with diet and exercise.  Is having pain in her low back and sciatic nerve.  Doing stretches.  Feels getting back into her exercise routine will help.  Is better when up moving around.     Past Medical History:  Diagnosis Date  . Hyperlipidemia   . Hypertension    Past Surgical History:  Procedure Laterality Date  . VAGINAL DELIVERY  1989   episiotomy, Dr. Arsenio Loader   Family History  Problem Relation Age of Onset  . Diabetes Mother   . Dementia Mother   . Stroke Father 46  . Hypertension Sister   . Thyroid disease Sister   . Diabetes Sister   . Hypertension Brother   . Diabetes Brother   . Hypertension Sister   . Hypertension Brother   . Hypertension Brother   . Hypertension Brother   . Hypertension Sister   . Breast cancer Neg Hx    Social History   Socioeconomic History  . Marital status: Single    Spouse name: Not on file  . Number of children: Not on file  . Years of education: Not on file  . Highest education level: Not on file  Occupational History  . Not on file  Social Needs  . Financial resource strain: Not on file  . Food insecurity:    Worry: Not on file    Inability: Not on file  . Transportation needs:    Medical: Not on file    Non-medical: Not  on file  Tobacco Use  . Smoking status: Current Some Day Smoker    Types: Cigarettes  . Smokeless tobacco: Never Used  Substance and Sexual Activity  . Alcohol use: Yes    Alcohol/week: 0.0 standard drinks  . Drug use: No  . Sexual activity: Not on file  Lifestyle  . Physical activity:    Days per week: Not on file    Minutes per session: Not on file  . Stress: Not on file  Relationships  . Social connections:    Talks on phone: Not on file    Gets together: Not on file    Attends religious service: Not on file    Active member of club or organization: Not on file    Attends meetings of clubs or organizations: Not on file    Relationship status: Not on file  Other Topics Concern  . Not on file  Social History Narrative   Lives in [place]. 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      Diet: regular diet, limits fried food   Exercise: walks goal 58mn daily    Outpatient Encounter Medications as of 09/22/2018  Medication Sig  . buPROPion (WELLBUTRIN XL) 150 MG 24 hr tablet TAKE 1 TABLET BY MOUTH ONCE DAILY X 10 DAYS, THEN TAKE 1 TABLET BY MOUTH TWICE DAILY  . hydrochlorothiazide (HYDRODIURIL) 25 MG tablet TAKE 1 TABLET BY MOUTH DAILY   No facility-administered encounter medications on file as of 09/22/2018.     Review of Systems  Constitutional: Negative for appetite change and unexpected weight change.  HENT: Negative for congestion and sinus pressure.   Eyes: Negative for pain and visual disturbance.  Respiratory: Negative for cough, chest tightness and shortness of breath.   Cardiovascular: Negative for chest pain, palpitations and leg swelling.  Gastrointestinal: Negative for abdominal pain, diarrhea, nausea and vomiting.  Genitourinary: Negative for difficulty urinating and dysuria.  Musculoskeletal: Positive for back pain. Negative for joint swelling.  Skin: Negative for color change and rash.  Neurological: Negative for dizziness,  light-headedness and headaches.  Hematological: Negative for adenopathy. Does not bruise/bleed easily.  Psychiatric/Behavioral: Negative for agitation and dysphoric mood.       Objective:    Physical Exam  Constitutional: She is oriented to person, place, and time. She appears well-developed and well-nourished. No distress.  HENT:  Nose: Nose normal.  Mouth/Throat: Oropharynx is clear and moist.  Eyes: Right eye exhibits no discharge. Left eye exhibits no discharge. No scleral icterus.  Neck: Neck supple. No thyromegaly present.  Cardiovascular: Normal rate and regular rhythm.  Pulmonary/Chest: Breath sounds normal. No accessory muscle usage. No tachypnea. No respiratory distress. She has no decreased breath sounds. She has no wheezes. She has no rhonchi. Right breast exhibits no inverted nipple, no mass, no nipple discharge and no tenderness (no axillary adenopathy). Left breast exhibits no inverted nipple, no mass, no nipple discharge and no tenderness (no axilarry adenopathy).  Abdominal: Soft. Bowel sounds are normal. There is no tenderness.  Musculoskeletal: She exhibits no edema or tenderness.  Some increased discomfort with SLR. No significant pain on exam.   Lymphadenopathy:    She has no cervical adenopathy.  Neurological: She is alert and oriented to person, place, and time.  Skin: No rash noted. No erythema.  Psychiatric: She has a normal mood and affect. Her behavior is normal.    BP 138/84   Pulse 90   Temp 98.3 F (36.8 C) (Oral)   Resp 18   Ht _0  (1.803 m)   Wt 248 lb (112.5 kg)   SpO2 98%   BMI 34.59 kg/m  Wt Readings from Last 3 Encounters:  09/22/18 248 lb (112.5 kg)  07/08/18 248 lb 3.2 oz (112.6 kg)  12/11/17 245 lb 12.8 oz (111.5 kg)     Lab Results  Component Value Date   WBC 6.1 02/02/2018   HGB 13.9 02/02/2018   HCT 40.9 02/02/2018   PLT 239.0 02/02/2018   GLUCOSE 84 07/08/2018   CHOL 178 07/08/2018   TRIG 90.0 07/08/2018   HDL 55.90  07/08/2018   LDLCALC 104 (H) 07/08/2018   ALT 25 07/08/2018   AST 20 07/08/2018   NA 137 07/08/2018   K 4.0 07/08/2018   CL 102 07/08/2018   CREATININE 0.72 07/08/2018   BUN 11 07/08/2018   CO2 30 07/08/2018   TSH 1.15 02/02/2018   HGBA1C 6.2 07/08/2018    Mm 3d Screen Breast Bilateral  Result Date: 08/05/2018 CLINICAL DATA:  Screening. EXAM: DIGITAL SCREENING BILATERAL MAMMOGRAM WITH TOMO AND CAD COMPARISON:  Previous exam(s). ACR Breast Density Category b: There are scattered areas of fibroglandular density. FINDINGS:  There are no findings suspicious for malignancy. Images were processed with CAD. IMPRESSION: No mammographic evidence of malignancy. A result letter of this screening mammogram will be mailed directly to the patient. RECOMMENDATION: Screening mammogram in one year. (Code:SM-B-01Y) BI-RADS CATEGORY  1: Negative. Electronically Signed   By: Lillia Mountain M.D.   On: 08/05/2018 08:55       Assessment & Plan:   Problem List Items Addressed This Visit    Back pain    Had w/up and MRI previously.  Flare recently.  Mild discomfort.  Discussed with her regarding further w/up and evaluation.  She wants to monitor for now.  Follow.  Notify me if persistent.        Health care maintenance    Physical today 09/22/18.  PAP 12/18/16 - negative with negative HPV.  Mammogram 08/05/18 - Birads I.  Just had colonoscopy.  Obtain results.        Hypercholesterolemia    Was on crestor.  Had leg cramping.  Improved off crestor.  Discussed starting a different cholesterol medication.  She declines.  Wants to work on diet and exercise.  Follow lipid panel.        Relevant Orders   Hepatic function panel   Lipid panel   Hyperglycemia    Low carb diet and exercise.  Follow met b and a1c        Relevant Orders   Hemoglobin A1c   Hypertension    Blood pressure as outlined.  Recheck improved.  Have her spot check her pressures.  Follow metabolic panel.  If persistent elevation will  require adjustment in her medication.        Relevant Orders   Basic metabolic panel   Routine general medical examination at a health care facility - Primary   Stress    Increased stress.  Discussed with her today.  Overall she feels she is doing well.  Has good support.  On wellbutrin.  Follow.       Thyroid fullness    Was followed by Dr Eddie Dibbles. Discussed with her today.  Wants to hold on f/u.       Relevant Orders   TSH   Tobacco use    Has cut down on her smoking.  On wellbutrin.  Discussed with her today.  Follow.            Einar Pheasant, MD

## 2018-09-22 NOTE — Assessment & Plan Note (Addendum)
Physical today 09/22/18.  PAP 12/18/16 - negative with negative HPV.  Mammogram 08/05/18 - Birads I.  Just had colonoscopy.  Obtain results.

## 2018-09-23 ENCOUNTER — Encounter: Payer: Self-pay | Admitting: Internal Medicine

## 2018-09-23 DIAGNOSIS — Z720 Tobacco use: Secondary | ICD-10-CM | POA: Insufficient documentation

## 2018-09-23 NOTE — Assessment & Plan Note (Signed)
Increased stress.  Discussed with her today.  Overall she feels she is doing well.  Has good support.  On wellbutrin.  Follow.

## 2018-09-23 NOTE — Assessment & Plan Note (Signed)
Was followed by Dr Eddie Dibbles. Discussed with her today.  Wants to hold on f/u.

## 2018-09-23 NOTE — Assessment & Plan Note (Signed)
Low carb diet and exercise.  Follow met b and a1c.   

## 2018-09-23 NOTE — Assessment & Plan Note (Signed)
Had w/up and MRI previously.  Flare recently.  Mild discomfort.  Discussed with her regarding further w/up and evaluation.  She wants to monitor for now.  Follow.  Notify me if persistent.

## 2018-09-23 NOTE — Assessment & Plan Note (Signed)
Has cut down on her smoking.  On wellbutrin.  Discussed with her today.  Follow.

## 2018-09-23 NOTE — Assessment & Plan Note (Signed)
Blood pressure as outlined.  Recheck improved.  Have her spot check her pressures.  Follow metabolic panel.  If persistent elevation will require adjustment in her medication.

## 2018-09-23 NOTE — Assessment & Plan Note (Signed)
Was on crestor.  Had leg cramping.  Improved off crestor.  Discussed starting a different cholesterol medication.  She declines.  Wants to work on diet and exercise.  Follow lipid panel.

## 2018-11-09 ENCOUNTER — Other Ambulatory Visit: Payer: Self-pay | Admitting: Internal Medicine

## 2018-11-16 ENCOUNTER — Telehealth: Payer: Self-pay | Admitting: Internal Medicine

## 2018-11-16 ENCOUNTER — Ambulatory Visit: Payer: Self-pay | Admitting: *Deleted

## 2018-11-16 ENCOUNTER — Other Ambulatory Visit: Payer: Self-pay

## 2018-11-16 MED ORDER — OSELTAMIVIR PHOSPHATE 75 MG PO CAPS: 75.0000 mg | ORAL_CAPSULE | Freq: Every day | ORAL | 0 refills | Status: DC

## 2018-11-16 NOTE — Telephone Encounter (Signed)
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  Reason for Disposition . 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 TYPE of EXPOSURE: "How were you exposed?" (e.g., close contact, not a close contact)     Work this weekend- close contact 2. DATE of EXPOSURE: "When did the exposure occur?" (e.g., hour, days, weeks)     Patient tested last night- positive for flu 3. PREGNANCY: "Is there any chance you are pregnant?" "When was your last menstrual period?"     n/a 4. HIGH RISK for COMPLICATIONS: "Do you have any heart or lung problems? Do you have a weakened immune system?" (e.g., CHF, COPD, asthma, HIV positive, chemotherapy, renal failure, diabetes mellitus, sickle cell anemia)     no 5. SYMPTOMS: "Do you have any symptoms?" (e.g., cough, fever, sore throat, difficulty breathing).     no  Protocols used: INFLUENZA EXPOSURE-A-AH

## 2018-11-16 NOTE — Telephone Encounter (Signed)
MyChart message sent . tamiflyu saent to [place]

## 2018-11-16 NOTE — Telephone Encounter (Signed)
Patient is aware 

## 2018-11-16 NOTE — Telephone Encounter (Signed)
Duplicate message. 

## 2018-11-16 NOTE — Telephone Encounter (Signed)
Patient is requesting tamiflu to be sent in. 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 She is not having active symptoms at this time but patient was coughing all over her and she would like to have this as a preventative.

## 2018-11-16 NOTE — Telephone Encounter (Signed)
Copied from [place] [id number]. Topic: Quick Communication - See Telephone Encounter >> Nov 16, 2018  9:14 AM Ivar Drape wrote: CRM for notification. See Telephone encounter for: 11/16/18. Patient was exposed to the flu yesterday, 11/15/2018 and she would like to be prescribed some Tamaflu and have it sent to her preferred pharmacy [facility].

## 2018-11-16 NOTE — Telephone Encounter (Signed)
See triage note.

## 2018-12-07 ENCOUNTER — Other Ambulatory Visit: Payer: 59

## 2018-12-07 ENCOUNTER — Other Ambulatory Visit (INDEPENDENT_AMBULATORY_CARE_PROVIDER_SITE_OTHER): Payer: 59

## 2018-12-07 DIAGNOSIS — I10 Essential (primary) hypertension: Secondary | ICD-10-CM | POA: Diagnosis not present

## 2018-12-07 DIAGNOSIS — E0789 Other specified disorders of thyroid: Secondary | ICD-10-CM | POA: Diagnosis not present

## 2018-12-07 DIAGNOSIS — R739 Hyperglycemia, unspecified: Secondary | ICD-10-CM

## 2018-12-07 DIAGNOSIS — E7800 Pure hypercholesterolemia, unspecified: Secondary | ICD-10-CM | POA: Diagnosis not present

## 2018-12-07 LAB — LIPID PANEL
Cholesterol: 207 mg/dL — ABNORMAL HIGH (ref 0–200)
HDL: 70.7 mg/dL (ref >39.00)
LDL Cholesterol: 121 mg/dL — ABNORMAL HIGH (ref 0–99)
NONHDL: 135.96
Total CHOL/HDL Ratio: 3
Triglycerides: 73 mg/dL (ref 0.0–149.0)
VLDL: 14.6 mg/dL (ref 0.0–40.0)

## 2018-12-07 LAB — HEPATIC FUNCTION PANEL
ALT: 23 U/L (ref 0–35)
AST: 25 U/L (ref 0–37)
Albumin: 4.3 g/dL (ref 3.5–5.2)
Alkaline Phosphatase: 57 U/L (ref 39–117)
BILIRUBIN DIRECT: 0.1 mg/dL (ref 0.0–0.3)
BILIRUBIN TOTAL: 0.7 mg/dL (ref 0.2–1.2)
TOTAL PROTEIN: 6.9 g/dL (ref 6.0–8.3)

## 2018-12-07 LAB — BASIC METABOLIC PANEL
BUN: 10 mg/dL (ref 6–23)
CO2: 27 mEq/L (ref 19–32)
Calcium: 9.1 mg/dL (ref 8.4–10.5)
Chloride: 105 mEq/L (ref 96–112)
Creatinine, Ser: 0.78 mg/dL (ref 0.40–1.20)
GFR: 93.99 mL/min (ref >60.00)
Glucose, Bld: 87 mg/dL (ref 70–99)
Potassium: 4.1 mEq/L (ref 3.5–5.1)
Sodium: 139 mEq/L (ref 135–145)

## 2018-12-07 LAB — HEMOGLOBIN A1C: Hgb A1c MFr Bld: 5.7 % (ref 4.6–6.5)

## 2018-12-08 ENCOUNTER — Encounter: Payer: Self-pay | Admitting: Internal Medicine

## 2018-12-08 ENCOUNTER — Ambulatory Visit: Payer: 59 | Admitting: Internal Medicine

## 2018-12-08 DIAGNOSIS — Z720 Tobacco use: Secondary | ICD-10-CM | POA: Diagnosis not present

## 2018-12-08 DIAGNOSIS — I10 Essential (primary) hypertension: Secondary | ICD-10-CM | POA: Diagnosis not present

## 2018-12-08 DIAGNOSIS — R739 Hyperglycemia, unspecified: Secondary | ICD-10-CM | POA: Diagnosis not present

## 2018-12-08 DIAGNOSIS — E7800 Pure hypercholesterolemia, unspecified: Secondary | ICD-10-CM | POA: Diagnosis not present

## 2018-12-08 DIAGNOSIS — F439 Reaction to severe stress, unspecified: Secondary | ICD-10-CM

## 2018-12-08 LAB — TSH: TSH: 0.48 u[IU]/mL (ref 0.35–4.50)

## 2018-12-08 NOTE — Progress Notes (Signed)
[name], female   DOB: [birth year], 53 y.o.   MRN: [id number]   Subjective:    [name], female    DOB: Oct 18, 1966, 53 y.o.   MRN: [id number]  HPI  Patient here for a scheduled follow up.  She reports she is doing relatively well.  Has been exercising and trying to watch her diet.  Has lost weight.  Feels better.  No chest pain.  No sob.  No acid reflux.  No abdominal pain.  Bowels moving.  Trying to quit smoking. We had discussed chantix.  She desires not to try chantix.  Cutting back on her own.  Handling stress.     Past Medical History:  Diagnosis Date  . Hyperlipidemia   . Hypertension    Past Surgical History:  Procedure Laterality Date  . VAGINAL DELIVERY  1989   episiotomy, Dr. Arsenio Loader   Family History  Problem Relation Age of Onset  . Diabetes Mother   . Dementia Mother   . Stroke Father 67  . Hypertension Sister   . Thyroid disease Sister   . Diabetes Sister   . Hypertension Brother   . Diabetes Brother   . Hypertension Sister   . Hypertension Brother   . Hypertension Brother   . Hypertension Brother   . Hypertension Sister   . Breast cancer Neg Hx    Social History   Socioeconomic History  . Marital status: Single    Spouse name: Not on file  . Number of children: Not on file  . Years of education: Not on file  . Highest education level: Not on file  Occupational History  . Not on file  Social Needs  . Financial resource strain: Not on file  . Food insecurity:    Worry: Not on file    Inability: Not on file  . Transportation needs:    Medical: Not on file    Non-medical: Not on file  Tobacco Use  . Smoking status: Current Some Day Smoker    Types: Cigarettes  . Smokeless tobacco: Never Used  Substance and Sexual Activity  . Alcohol use: Yes    Alcohol/week: 0.0 standard drinks  . Drug use: No  . Sexual activity: Not on file  Lifestyle  . Physical activity:    Days per week: Not on file    Minutes  per session: Not on file  . Stress: Not on file  Relationships  . Social connections:    Talks on phone: Not on file    Gets together: Not on file    Attends religious service: Not on file    Active member of club or organization: Not on file    Attends meetings of clubs or organizations: Not on file    Relationship status: Not on file  Other Topics Concern  . Not on file  Social History Narrative   Lives in [place]. 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      Diet: regular diet, limits fried food   Exercise: walks goal 63mn daily    Outpatient Encounter Medications as of 12/08/2018  Medication Sig  . buPROPion (WELLBUTRIN XL) 150 MG 24 hr tablet TAKE 1 TABLET BY MOUTH ONCE DAILY FOR 10 DAYS, THEN TAKE 1 TABLET BY MOUTH TWICE DAILY  . hydrochlorothiazide (HYDRODIURIL) 25 MG tablet TAKE 1 TABLET BY MOUTH DAILY  . Multiple Vitamin (MULTIVITAMIN WITH MINERALS) TABS tablet Take 1 tablet by mouth daily.  . vitamin  B-12 (CYANOCOBALAMIN) 500 MCG tablet Take 500 mcg by mouth daily.  . [DISCONTINUED] oseltamivir (TAMIFLU) 75 MG capsule Take 1 capsule (75 mg total) by mouth daily. (Patient not taking: Reported on 12/08/2018)   No facility-administered encounter medications on file as of 12/08/2018.     Review of Systems  Constitutional: Negative for appetite change and unexpected weight change.  HENT: Negative for congestion and sinus pressure.   Respiratory: Negative for cough, chest tightness and shortness of breath.   Cardiovascular: Negative for chest pain, palpitations and leg swelling.  Gastrointestinal: Negative for abdominal pain, diarrhea, nausea and vomiting.  Genitourinary: Negative for difficulty urinating and dysuria.  Musculoskeletal: Negative for joint swelling and myalgias.  Skin: Negative for color change and rash.  Neurological: Negative for dizziness, light-headedness and headaches.  Psychiatric/Behavioral: Negative for agitation and dysphoric  mood.       Objective:    Physical Exam Constitutional:      General: She is not in acute distress.    Appearance: Normal appearance.  HENT:     Nose: Nose normal. No congestion.     Mouth/Throat:     Pharynx: No oropharyngeal exudate or posterior oropharyngeal erythema.  Neck:     Musculoskeletal: Neck supple. No muscular tenderness.     Thyroid: No thyromegaly.  Cardiovascular:     Rate and Rhythm: Normal rate and regular rhythm.  Pulmonary:     Effort: No respiratory distress.     Breath sounds: Normal breath sounds. No wheezing.  Abdominal:     General: Bowel sounds are normal.     Palpations: Abdomen is soft.     Tenderness: There is no abdominal tenderness.  Musculoskeletal:        General: No swelling or tenderness.  Lymphadenopathy:     Cervical: No cervical adenopathy.  Skin:    Findings: No erythema or rash.  Neurological:     Mental Status: She is alert.  Psychiatric:        Mood and Affect: Mood normal.        Behavior: Behavior normal.     BP 130/80   Pulse 71   Temp 98.3 F (36.8 C) (Oral)   Wt 237 lb 3.2 oz (107.6 kg)   SpO2 98%   BMI 33.08 kg/m  Wt Readings from Last 3 Encounters:  12/08/18 237 lb 3.2 oz (107.6 kg)  09/22/18 248 lb (112.5 kg)  07/08/18 248 lb 3.2 oz (112.6 kg)     Lab Results  Component Value Date   WBC 6.1 02/02/2018   HGB 13.9 02/02/2018   HCT 40.9 02/02/2018   PLT 239.0 02/02/2018   GLUCOSE 87 12/07/2018   CHOL 207 (H) 12/07/2018   TRIG 73.0 12/07/2018   HDL 70.70 12/07/2018   LDLCALC 121 (H) 12/07/2018   ALT 23 12/07/2018   AST 25 12/07/2018   NA 139 12/07/2018   K 4.1 12/07/2018   CL 105 12/07/2018   CREATININE 0.78 12/07/2018   BUN 10 12/07/2018   CO2 27 12/07/2018   TSH 0.48 12/07/2018   HGBA1C 5.7 12/07/2018    Mm 3d Screen Breast Bilateral  Result Date: 08/05/2018 CLINICAL DATA:  Screening. EXAM: DIGITAL SCREENING BILATERAL MAMMOGRAM WITH TOMO AND CAD COMPARISON:  Previous exam(s). ACR Breast  Density Category b: There are scattered areas of fibroglandular density. FINDINGS: There are no findings suspicious for malignancy. Images were processed with CAD. IMPRESSION: No mammographic evidence of malignancy. A result letter of this screening mammogram will be mailed directly to  the patient. RECOMMENDATION: Screening mammogram in one year. (Code:SM-B-01Y) BI-RADS CATEGORY  1: Negative. Electronically Signed   By: Lillia Mountain M.D.   On: 08/05/2018 08:55       Assessment & Plan:   Problem List Items Addressed This Visit    Hypercholesterolemia    Was on crestor.  Had leg cramping.  Improved off crestor.  Has adjusted her diet.  Lost weight.  Follow lipid panel.       Hyperglycemia    Low carb diet and exercise.  Follow met b and a1c.        Hypertension    Blood pressure under good control.  Continue same medication regimen.  Follow pressures.  Follow metabolic panel.        Stress    Overall she feels she is handling things relatively well.  Follow.        Tobacco use    Has cut back.  On wellbutrin.  Follow.           Einar Pheasant, MD

## 2018-12-09 ENCOUNTER — Encounter: Payer: Self-pay | Admitting: Internal Medicine

## 2018-12-10 NOTE — Telephone Encounter (Signed)
See other mychart message.

## 2018-12-11 ENCOUNTER — Encounter: Payer: Self-pay | Admitting: Internal Medicine

## 2018-12-11 NOTE — Assessment & Plan Note (Signed)
Was on crestor.  Had leg cramping.  Improved off crestor.  Has adjusted her diet.  Lost weight.  Follow lipid panel.

## 2018-12-11 NOTE — Assessment & Plan Note (Signed)
Has cut back.  On wellbutrin.  Follow.

## 2018-12-11 NOTE — Assessment & Plan Note (Signed)
Blood pressure under good control.  Continue same medication regimen.  Follow pressures.  Follow metabolic panel.   

## 2018-12-11 NOTE — Assessment & Plan Note (Signed)
Overall she feels she is handling things relatively well.  Follow.  

## 2018-12-11 NOTE — Assessment & Plan Note (Signed)
Low carb diet and exercise.  Follow met b and a1c.   

## 2019-01-04 ENCOUNTER — Other Ambulatory Visit: Payer: Self-pay | Admitting: Internal Medicine

## 2019-02-10 ENCOUNTER — Other Ambulatory Visit: Payer: Self-pay | Admitting: Internal Medicine

## 2019-02-10 MED ORDER — BUPROPION HCL ER (XL) 150 MG PO TB24: ORAL_TABLET | ORAL | 0 refills | Status: DC

## 2019-02-10 NOTE — Telephone Encounter (Signed)
Requested Prescriptions  Pending Prescriptions Disp Refills  . buPROPion (WELLBUTRIN XL) 150 MG 24 hr tablet 180 tablet 0    Sig: TAKE 1 TABLET BY MOUTH ONCE DAILY FOR 10 DAYS, THEN TAKE 1 TABLET BY MOUTH TWICE DAILY     Psychiatry: Antidepressants - bupropion Passed - 02/10/2019 11:26 AM      Passed - Last BP in normal range    BP Readings from Last 1 Encounters:  12/08/18 130/80         Passed - Valid encounter within last 6 months    Recent Outpatient Visits          2 months ago Hypercholesterolemia   [place] [facility], Randell Patient, MD   4 months ago Routine general medical examination at a health care facility   [facility], Randell Patient, MD   7 months ago Breast cancer screening   [facility] [place], Randell Patient, MD   1 year ago Encounter for smoking cessation counseling   [facility] [place], Randell Patient, MD   1 year ago Need for immunization against influenza   [place], MD      Future Appointments            In 2 months Einar Pheasant, MD [place], [facility]

## 2019-04-13 ENCOUNTER — Encounter: Payer: Self-pay | Admitting: Internal Medicine

## 2019-04-13 ENCOUNTER — Other Ambulatory Visit: Payer: Self-pay

## 2019-04-13 ENCOUNTER — Ambulatory Visit (INDEPENDENT_AMBULATORY_CARE_PROVIDER_SITE_OTHER): Payer: 59 | Admitting: Internal Medicine

## 2019-04-13 DIAGNOSIS — I10 Essential (primary) hypertension: Secondary | ICD-10-CM

## 2019-04-13 DIAGNOSIS — R739 Hyperglycemia, unspecified: Secondary | ICD-10-CM | POA: Diagnosis not present

## 2019-04-13 DIAGNOSIS — E7800 Pure hypercholesterolemia, unspecified: Secondary | ICD-10-CM | POA: Diagnosis not present

## 2019-04-13 DIAGNOSIS — F439 Reaction to severe stress, unspecified: Secondary | ICD-10-CM | POA: Diagnosis not present

## 2019-04-13 MED ORDER — BUPROPION HCL ER (XL) 150 MG PO TB24: ORAL_TABLET | ORAL | 1 refills | Status: DC

## 2019-04-13 NOTE — Progress Notes (Addendum)
[name], female   DOB: 1966/01/16, 53 y.o.   MRN: [id number]   Virtual Visit via telephone Note  This visit type was conducted due to national recommendations for restrictions regarding the COVID-19 pandemic (e.g. social distancing).  This format is felt to be most appropriate for this patient at this time.  All issues noted in this document were discussed and addressed.  No physical exam was performed (except for noted visual exam findings with Video Visits).   I connected with [name] by telephone and verified that I am speaking with the correct person using two identifiers. Location patient: home Location provider: work  Persons participating in the telephone visit: patient, provider  I discussed the limitations, risks, security and privacy concerns of performing an evaluation and management service by telephone and the availability of in person appointments.  The patient expressed understanding and agreed to proceed.   Reason for visit: scheduled follow up.   HPI: She reports she is doing well.  Trying to stay in secondary to covid restrictions.  No fever.  No chest congestion, sob or cough.  No acid reflux.  No abdominal pain.  Bowels moving.  Discussed increased stress.  Has good support.  She feels she is handling things relatively well.  Does not feel needs anything more at this time.     ROS: See pertinent positives and negatives per HPI.  Past Medical History:  Diagnosis Date  . Hyperlipidemia   . Hypertension     Past Surgical History:  Procedure Laterality Date  . VAGINAL DELIVERY  1989   episiotomy, Dr. Arsenio Loader    Family History  Problem Relation Age of Onset  . Diabetes Mother   . Dementia Mother   . Stroke Father 30  . Hypertension Sister   . Thyroid disease Sister   . Diabetes Sister   . Hypertension Brother   . Diabetes Brother   . Hypertension Sister   . Hypertension Brother   . Hypertension Brother   . Hypertension  Brother   . Hypertension Sister   . Breast cancer Neg Hx     SOCIAL HX: reviewed.    Current Outpatient Medications:  .  buPROPion (WELLBUTRIN XL) 150 MG 24 hr tablet, TAKE 1 TABLET BY MOUTH TWICE DAILY, Disp: 180 tablet, Rfl: 1 .  hydrochlorothiazide (HYDRODIURIL) 25 MG tablet, TAKE 1 TABLET BY MOUTH DAILY, Disp: 90 tablet, Rfl: 2 .  Multiple Vitamin (MULTIVITAMIN WITH MINERALS) TABS tablet, Take 1 tablet by mouth daily., Disp: , Rfl:  .  vitamin B-12 (CYANOCOBALAMIN) 500 MCG tablet, Take 500 mcg by mouth daily., Disp: , Rfl:   EXAM:  GENERAL: alert.  Sounds to be in no acute distress.  Answering questions appropriately.    PSYCH/NEURO: pleasant and cooperative, no obvious depression or anxiety, speech and thought processing grossly intact  ASSESSMENT AND PLAN:  Discussed the following assessment and plan:  Hypercholesterolemia Low cholesterol diet and exercise.  Follow lipid panel.  Intolerant to crestor.    Hyperglycemia Low carb diet and exercise.  Follow met b and a1c.   Hypertension Blood pressure under good control.  Continue current medication regimen.  Follow pressures.  Follow metabolic panel.    Stress Increased stress.  Discussed with her today.  Has good support.  Does not feel needs any further intervention.  Follow.      I discussed the assessment and treatment plan with the patient. The patient was provided an opportunity to ask questions and all were answered.  The patient agreed with the plan and demonstrated an understanding of the instructions.   The patient was advised to call back or seek an in-person evaluation if the symptoms worsen or if the condition fails to improve as anticipated.  I provided 13 minutes of non-face-to-face time during this encounter.   Einar Pheasant, MD

## 2019-04-17 ENCOUNTER — Encounter: Payer: Self-pay | Admitting: Internal Medicine

## 2019-04-17 NOTE — Assessment & Plan Note (Signed)
Low carb diet and exercise.  Follow met b and a1c.  

## 2019-04-17 NOTE — Addendum Note (Signed)
Addended by: Alisa Graff on: 04/17/2019 10:39 PM   Modules accepted: Orders

## 2019-04-17 NOTE — Assessment & Plan Note (Signed)
Blood pressure under good control.  Continue current medication regimen.  Follow pressures.  Follow metabolic panel.

## 2019-04-17 NOTE — Assessment & Plan Note (Signed)
Increased stress.  Discussed with her today.  Has good support.  Does not feel needs any further intervention.  Follow.

## 2019-04-17 NOTE — Assessment & Plan Note (Signed)
Low cholesterol diet and exercise.  Follow lipid panel.  Intolerant to crestor.

## 2019-04-28 ENCOUNTER — Telehealth: Payer: Self-pay

## 2019-04-28 NOTE — Telephone Encounter (Signed)
Lab appt cancelled 

## 2019-04-28 NOTE — Telephone Encounter (Signed)
Copied from [place] [id number]. Topic: Quick Communication - Appointment Cancellation >> Apr 28, 2019 12:24 PM Rutherford Nail, NT wrote: Patient called to cancel lab appointment scheduled for 05/03/2019. Patient states that there was a death in the family and she will not be able to make her appointment. Did not wish to reschedule at this time. Attempted office and was put directly on hold for an extended amount of time. Please advise.  Route to department's PEC pool.

## 2019-05-03 ENCOUNTER — Other Ambulatory Visit: Payer: 59

## 2019-05-05 ENCOUNTER — Encounter: Payer: Self-pay | Admitting: Internal Medicine

## 2019-05-06 NOTE — Telephone Encounter (Signed)
LMTCB to offer doxy with Ander Purpura

## 2019-05-07 ENCOUNTER — Encounter: Payer: Self-pay | Admitting: Family

## 2019-05-07 ENCOUNTER — Ambulatory Visit (INDEPENDENT_AMBULATORY_CARE_PROVIDER_SITE_OTHER): Payer: 59 | Admitting: Family

## 2019-05-07 ENCOUNTER — Other Ambulatory Visit: Payer: Self-pay

## 2019-05-07 VITALS — Ht 70.98 in | Wt 237.2 lb

## 2019-05-07 DIAGNOSIS — F439 Reaction to severe stress, unspecified: Secondary | ICD-10-CM | POA: Diagnosis not present

## 2019-05-07 DIAGNOSIS — R21 Rash and other nonspecific skin eruption: Secondary | ICD-10-CM

## 2019-05-07 MED ORDER — HYDROXYZINE HCL 10 MG PO TABS: 10.0000 mg | ORAL_TABLET | Freq: Every evening | ORAL | 1 refills | Status: DC | PRN

## 2019-05-07 MED ORDER — TRIAMCINOLONE ACETONIDE 0.025 % EX CREA: 1.0000 "application " | TOPICAL_CREAM | Freq: Two times a day (BID) | CUTANEOUS | 0 refills | Status: DC

## 2019-05-07 MED ORDER — DOXYCYCLINE HYCLATE 100 MG PO TABS: 100.0000 mg | ORAL_TABLET | Freq: Two times a day (BID) | ORAL | 0 refills | Status: DC

## 2019-05-07 NOTE — Assessment & Plan Note (Addendum)
Etiology of rash remains uncertain at this time.  Patient is nontoxic in appearance today.  We discussed at length triggering factors which may have been new detergent, wearing mask, anxiety.  Will treat for possible infection, contact dermatitis, and anxiety.  we agreed to treat with triamcinolone ( advised to mix with emollient),  oral antibiotic ( doxycycline) as well as atarax. She declines any medication such as an SSRI or BuSpar for anxiety.  Discussed at length  other potential causes for rash. Although patient denies any arthralgias, did advise that she speak with her PCP about any further work-up including rheumatology consult, autoimmune studies which patient stated that she would.  I did place a dermatology referral today.  Patient will let me know if she does not improve.

## 2019-05-07 NOTE — Patient Instructions (Addendum)
Neosporin with kenalog and vaseline on face. Avoid long uses of kenalog ( triamcinolone) on your face as it can discolor our skin. Use a small amount, for a short period of time.   Atarax at bedtime if needed for anxiety, itching. It is sedating so ensure you are home , not driving , nor drinking alcohol with this medication.   Start doxycycline; Avoid the sun  Ensure to take probiotics while on antibiotics and also for 2 weeks after completion. It is important to re-colonize the gut with good bacteria and also to prevent any diarrheal infections associated with antibiotic use.   Today we discussed referrals, orders. Dermatology Dr Darrick Huntsman   I have placed these orders in the system for you.  Please be sure to give [place] a call if you have not heard from our office regarding this. We should hear from [place] within ONE week with information regarding your appointment. If not, please let me know immediately.    Please follow up with Dr Nicki Reaper in regards to further testing ( autoimmune) and if you feel warranted.   Let me know how you are

## 2019-05-07 NOTE — Assessment & Plan Note (Signed)
Depression screen [facility] 2/9 05/07/2019 05/07/2019 04/13/2019  Decreased Interest 1 0 0  Down, Depressed, Hopeless 3 1 0  PHQ - 2 Score 4 1 0  Altered sleeping 3 - -  Tired, decreased energy 3 - -  Change in appetite 3 - -  Feeling bad or failure about yourself  0 - -  Trouble concentrating 3 - -  Moving slowly or fidgety/restless 0 - -  Suicidal thoughts 0 - -  PHQ-9 Score 16 - -  Difficult doing work/chores Not difficult at all - -

## 2019-05-07 NOTE — Progress Notes (Signed)
This visit type was conducted due to national recommendations for restrictions regarding the COVID-19 pandemic (e.g. social distancing).  This format is felt to be most appropriate for this patient at this time.  All issues noted in this document were discussed and addressed.  No physical exam was performed (except for noted visual exam findings with Video Visits). Virtual Visit via Video Note  I connected with@  on 05/07/19 at  9:00 AM EDT by a video enabled telemedicine application and verified that I am speaking with the correct person using two identifiers.  Location patient: home Location provider:work Persons participating in the virtual visit: patient, provider  I discussed the limitations of evaluation and management by telemedicine and the availability of in person appointments. The patient expressed understanding and agreed to proceed.  Interactive audio and video telecommunications were attempted between this provider and patient, however failed, due to patient having technical difficulties or patient did not have access to video capability.  We continued and completed visit with audio only.   HPI: CC: rash on face x 6 days, worsening.  'all over face and on neck.'  Describes as raised red lesions.  A couple of lesions appear more like a pimple which were over her chin.  No discharge,red streaking  Skin eels very dry. Itchy at times. Eyes are also swollen and puffy. NO vision changes, trouble swallowing, choking, cough, fever, diarrhea, joint pain.  No h/o mrsa, cellulitis.   No seasonal allergies.  Thinks rash is combination of anxiety and from mask.  Doesn't use anything on skin as skin is sensitive. Has seen dermatology in the past and given triamcinolone. May have been exposed to new detergent on wash cloth.  Used hydrocortisone OTC and made it worse.   Decreased energy.  Brother passed away; funeral last 01/22/23 No si/hi  Works as Marine scientist.   On wellbutrin -primary to  help with smoking cessation. Doesn't want to start another medication for anxiety.   History of rosacea, eczema  ROS: See pertinent positives and negatives per HPI.  Past Medical History:  Diagnosis Date  . Hyperlipidemia   . Hypertension     Past Surgical History:  Procedure Laterality Date  . VAGINAL DELIVERY  1989   episiotomy, Dr. Arsenio Loader    Family History  Problem Relation Age of Onset  . Diabetes Mother   . Dementia Mother   . Stroke Father 25  . Hypertension Sister   . Thyroid disease Sister   . Diabetes Sister   . Hypertension Brother   . Diabetes Brother   . Hypertension Sister   . Hypertension Brother   . Hypertension Brother   . Hypertension Brother   . Hypertension Sister   . Breast cancer Neg Hx   . Lupus Neg Hx     SOCIAL HX: smoker   Current Outpatient Medications:  .  buPROPion (WELLBUTRIN XL) 150 MG 24 hr tablet, TAKE 1 TABLET BY MOUTH TWICE DAILY, Disp: 180 tablet, Rfl: 1 .  hydrochlorothiazide (HYDRODIURIL) 25 MG tablet, TAKE 1 TABLET BY MOUTH DAILY, Disp: 90 tablet, Rfl: 2 .  Multiple Vitamin (MULTIVITAMIN WITH MINERALS) TABS tablet, Take 1 tablet by mouth daily., Disp: , Rfl:  .  Turmeric 500 MG CAPS, Take 500 mg by mouth once., Disp: , Rfl:  .  vitamin B-12 (CYANOCOBALAMIN) 500 MCG tablet, Take 500 mcg by mouth daily., Disp: , Rfl:  .  doxycycline (VIBRA-TABS) 100 MG tablet, Take 1 tablet (100 mg total) by mouth 2 (two) times daily.,  Disp: 10 tablet, Rfl: 0 .  hydrOXYzine (ATARAX/VISTARIL) 10 MG tablet, Take 1 tablet (10 mg total) by mouth at bedtime as needed for itching or anxiety., Disp: 30 tablet, Rfl: 1 .  triamcinolone (KENALOG) 0.025 % cream, Apply 1 application topically 2 (two) times daily., Disp: 80 g, Rfl: 0  EXAM:  VITALS per patient if applicable:  GENERAL: alert, oriented, appears well and in no acute distress  FACE: Unable to appreciate lesions on face over video.  HEENT: atraumatic, conjunttiva clear, no obvious  abnormalities on inspection of external nose and ears  NECK: normal movements of the head and neck  LUNGS: on inspection no signs of respiratory distress, breathing rate appears normal, no obvious gross SOB, gasping or wheezing  CV: no obvious cyanosis  MS: moves all visible extremities without noticeable abnormality  PSYCH/NEURO: pleasant and cooperative, no obvious depression or anxiety, speech and thought processing grossly intact  ASSESSMENT AND PLAN:  Discussed the following assessment and plan: Problem List Items Addressed This Visit      Musculoskeletal and Integument   Rash - Primary    Etiology of rash remains uncertain at this time.  Patient is nontoxic in appearance today.  We discussed at length triggering factors which may have been new detergent, wearing mask, anxiety.  Will treat for possible infection, contact dermatitis, and anxiety.  we agreed to treat with triamcinolone ( advised to mix with emollient),  oral antibiotic ( doxycycline) as well as atarax. She declines any medication such as an SSRI or BuSpar for anxiety.  Discussed at length  other potential causes for rash. Although patient denies any arthralgias, did advise that she speak with her PCP about any further work-up including rheumatology consult, autoimmune studies which patient stated that she would.  I did place a dermatology referral today.  Patient will let me know if she does not improve.      Relevant Medications   triamcinolone (KENALOG) 0.025 % cream   hydrOXYzine (ATARAX/VISTARIL) 10 MG tablet   doxycycline (VIBRA-TABS) 100 MG tablet   Other Relevant Orders   Ambulatory referral to Dermatology     Other   Stress    Depression screen [facility] 2/9 05/07/2019 05/07/2019 04/13/2019  Decreased Interest 1 0 0  Down, Depressed, Hopeless 3 1 0  PHQ - 2 Score 4 1 0  Altered sleeping 3 - -  Tired, decreased energy 3 - -  Change in appetite 3 - -  Feeling bad or failure about yourself  0 - -  Trouble  concentrating 3 - -  Moving slowly or fidgety/restless 0 - -  Suicidal thoughts 0 - -  PHQ-9 Score 16 - -  Difficult doing work/chores Not difficult at all - -              I discussed the assessment and treatment plan with the patient. The patient was provided an opportunity to ask questions and all were answered. The patient agreed with the plan and demonstrated an understanding of the instructions.   The patient was advised to call back or seek an in-person evaluation if the symptoms worsen or if the condition fails to improve as anticipated.   Mable Paris, FNP   I spent 25 min non face to face w/ pt.

## 2019-05-07 NOTE — Progress Notes (Signed)
Patient c/o having a face rash.  Pt believes that rash is coming from a combination of wearing a mask and hives from anxiety.  Pt said that she recently buried her brother and has been experiencing anxiety.    GAD 7- 15   PHQ-9- 16

## 2019-05-10 DIAGNOSIS — F320 Major depressive disorder, single episode, mild: Secondary | ICD-10-CM | POA: Diagnosis not present

## 2019-05-10 NOTE — Telephone Encounter (Signed)
Pt saw Joycelyn Schmid

## 2019-05-14 ENCOUNTER — Telehealth: Payer: Self-pay

## 2019-05-14 ENCOUNTER — Other Ambulatory Visit: Payer: Self-pay | Admitting: Occupational Medicine

## 2019-05-14 ENCOUNTER — Ambulatory Visit: Payer: 59 | Admitting: Family

## 2019-05-14 DIAGNOSIS — Z20822 Contact with and (suspected) exposure to covid-19: Secondary | ICD-10-CM

## 2019-05-14 DIAGNOSIS — Z20828 Contact with and (suspected) exposure to other viral communicable diseases: Secondary | ICD-10-CM

## 2019-05-14 LAB — NOVEL CORONAVIRUS, NAA: SARS-CoV-2, NAA: NOT DETECTED

## 2019-05-14 NOTE — Telephone Encounter (Signed)
Patient says she cannot stay out of work unless health at work takes her out that she is out of [place] time and she is afraid of losing her job. Patient promised to call heath at work and advise them of all her symptoms, but if [facility] determines she is safe to work she is going into work.Marland Kitchen

## 2019-05-14 NOTE — Telephone Encounter (Signed)
Pt said that she is not having any symptoms.  Pt said that she previously saw and was being treated for a rash Paula Wallace.     Pt said that her family had been exposed to a lot of people recently due to the passing of her brother.  Pt said that her other brother tested positive for COVID and was transported to [facility] yesterday due to [place].  Pt denied having no other symptoms except for a rash.  Pt was scheduled a virtual appt today w/ Paula Paris, FNP @ 3:30 pm.  Pt wants to be tested for COVID at [facility] and would like for orders to be put in so that she can be tested today before the site closes at 3:30 pm.    Pt asked about returning to work.  Advised pt to self quarantine since she has been exposed to her brother who tested positive for COVID until she gets her results back.  Pt will wait for call from [place] in regards to order for COVID test.

## 2019-05-14 NOTE — Telephone Encounter (Signed)
Pt was in the car with her sister who is also a pt of Dr. Bary Leriche when she asked to speak to me.   Both pt and her sister were tested earlier today at [facility] at [facility].  Pt requested to cancel her virtual visit scheduled today w/ Mable Paris.

## 2019-05-14 NOTE — Telephone Encounter (Signed)
Thank. You.  Let me know if I need to do anything.

## 2019-05-14 NOTE — Telephone Encounter (Signed)
Pt said that she plans to go to work tonight.  Pt works at [employer].   Reminded pt about self-quarantining since she has been tested for COVID.  Pt said that [facility] [employer] was aware that she had been exposed to her brother who tested positive for COVID and aware that she had been tested but told her it was ok to return to work since it has been over 10 days since her symptoms occurred.  Asked pt if [facility] [employer] was aware that she has been around her sister who is also exhibiting symptoms and was also tested today.  Pt said that [facility] [employer] was not aware of this.    Advised pt to call [employer] to make them aware.

## 2019-05-14 NOTE — Telephone Encounter (Signed)
Copied from [place] [id number]. Topic: General - Other >> May 14, 2019 10:12 AM Pauline Good wrote: Reason for CRM: pt was exposued to her brother July 13 and he tested positive for covid yesterday and was transported to [facility]. Pt need to know does she need to get tested of if she is in the clear, please call pt

## 2019-05-14 NOTE — Telephone Encounter (Signed)
Call pt covid test ordered Advise to go to [facility] prior to 330 to have test done.

## 2019-05-14 NOTE — Telephone Encounter (Signed)
Please call pt and tell her that I reviewed notes.  Needs to self quarantine.

## 2019-05-23 ENCOUNTER — Telehealth: Payer: Self-pay

## 2019-05-23 NOTE — Telephone Encounter (Signed)
Pt called for test results. Results not in yet. Pt verbalized understanding

## 2019-05-24 ENCOUNTER — Telehealth: Payer: Self-pay

## 2019-05-24 NOTE — Telephone Encounter (Signed)
Pt said that Dr. Nicki Reaper is aware that she has these rash flare ups.  Pt is not sure if Dr. Nicki Reaper can do autoimmune testing.  Pt would like a referral for  autoimmune disease testing.  Pt said that she had seen a dermatologist on [place].  Pt said that she was concerned because of the butterfly pattern.  Pt said that rash has come back on her forehead, cheeks, around her mouth, spots on eyes and neck.  Pt said that she sent pictures through [place] recently.  Pt said that meds prescribed at her last visit with Joycelyn Schmid is not helping.  Pt said that she stopped taking triamcinolone  because it burned her face.   Pt wants to be advised about referral.

## 2019-05-24 NOTE — Telephone Encounter (Signed)
Copied from [place] [id number]. Topic: Appointment Scheduling - Scheduling Inquiry for Clinic >> May 24, 2019  4:17 PM Rayann Heman wrote: Reason for CRM: pt called to schedule an appointment for a referral. Pt states that she is having a rash flare up. Please advise

## 2019-05-25 ENCOUNTER — Other Ambulatory Visit: Payer: Self-pay | Admitting: Internal Medicine

## 2019-05-25 ENCOUNTER — Telehealth: Payer: Self-pay

## 2019-05-25 DIAGNOSIS — R21 Rash and other nonspecific skin eruption: Secondary | ICD-10-CM

## 2019-05-25 NOTE — Progress Notes (Signed)
Order placed for dermatology referral.  

## 2019-05-25 NOTE — Telephone Encounter (Signed)
Pt had testing on 05/14/19 and results are not back. pt stated that she needs to know so she can go back to work. Advised pt that I will call [facility] to find out any information and will call her back. Pt verbalized understanding.  Spoke with Collie Siad and she has results but unable to release them to me. Collie Siad stated her test was order through a different account number and her address was not listed. Offered t give her address but was instructed to call her PCP office and ask them to call to get the information or to have pt call [facility] at [phone].  Called PCP office and Rashida stated to call [facility] [employer] at [phone]. Called [facility] [employer] [place] and gave her pt's name and DOB and explained the information. Caryl Comes stated that she will talk to her supervisor and put me on hold. [place] stated her supervisor has the results and will notify the patient. Called pt and informed her that [facility] [employer] will call her with results. Pt verbalized understanding

## 2019-05-25 NOTE — Telephone Encounter (Signed)
Order placed for dermatology referral.  

## 2019-05-25 NOTE — Telephone Encounter (Signed)
I am ok with placing an order for a referral to dermatology.  Does she have a preference of which dermatologist she wants to see.  Just let me know and I will place the order and someone will notify her of appt date and time.  Let me know if needs anything else.

## 2019-05-26 ENCOUNTER — Encounter: Payer: Self-pay | Admitting: Hematology

## 2019-05-26 LAB — INPATIENT

## 2019-05-26 LAB — NOVEL CORONAVIRUS, NAA: SARS-CoV-2, NAA: NOT DETECTED

## 2019-05-26 NOTE — Telephone Encounter (Signed)
Patient called in stating she was instructed by [facility] [employer] that she would have to get her results from the PEC/PCP. Advised her to contact [employer] [facility] [employer], due to them handling employee results. Patient stated she was advised by [facility] [employer] that they are not giving results. Patient requested manager's name and number to have [facility] [employer] contact them and get clarification on the situation. Information provided and manager made aware.

## 2019-05-26 NOTE — Telephone Encounter (Signed)
Attempted to call pt but no answer at this time. Unable to leave a voicemail message due to full mailbox. Pt will need to be notified that test results from 05/14/19  for COVID-19 was negative.

## 2019-05-27 ENCOUNTER — Encounter: Payer: Self-pay | Admitting: Internal Medicine

## 2019-05-27 DIAGNOSIS — L309 Dermatitis, unspecified: Secondary | ICD-10-CM | POA: Diagnosis not present

## 2019-05-27 DIAGNOSIS — L710 Perioral dermatitis: Secondary | ICD-10-CM | POA: Diagnosis not present

## 2019-05-27 NOTE — Telephone Encounter (Signed)
Called pt. Unable to leave message, voicemail full . Can we do the blood test for autoimmune?

## 2019-05-27 NOTE — Telephone Encounter (Signed)
Per 05/24/19 telephone message, she wanted referral to dermatology (Dr Evorn Gong) for evaluation of the rash.  I placed the order for the referral.  We can do some testing for autoimmune, but to streamline w/up, I would like for dermatology to evaluate.  I will f/u with Melissa regarding referral status.

## 2019-05-28 NOTE — Telephone Encounter (Signed)
Pt was seen by dermatology today

## 2019-08-24 ENCOUNTER — Ambulatory Visit
Admission: RE | Admit: 2019-08-24 | Discharge: 2019-08-24 | Disposition: A | Discharge status: Home / Self Care | Payer: 59 | Source: Ambulatory Visit | Attending: Internal Medicine | Admitting: Internal Medicine

## 2019-08-24 ENCOUNTER — Other Ambulatory Visit: Payer: Self-pay | Admitting: Internal Medicine

## 2019-08-24 DIAGNOSIS — Z1231 Encounter for screening mammogram for malignant neoplasm of breast: Secondary | ICD-10-CM

## 2019-09-28 DIAGNOSIS — H5213 Myopia, bilateral: Secondary | ICD-10-CM | POA: Diagnosis not present

## 2019-09-30 ENCOUNTER — Encounter: Payer: Self-pay | Admitting: Internal Medicine

## 2019-09-30 ENCOUNTER — Ambulatory Visit (INDEPENDENT_AMBULATORY_CARE_PROVIDER_SITE_OTHER): Payer: 59 | Admitting: Internal Medicine

## 2019-09-30 ENCOUNTER — Other Ambulatory Visit: Payer: Self-pay

## 2019-09-30 DIAGNOSIS — I10 Essential (primary) hypertension: Secondary | ICD-10-CM

## 2019-09-30 DIAGNOSIS — E7800 Pure hypercholesterolemia, unspecified: Secondary | ICD-10-CM | POA: Diagnosis not present

## 2019-09-30 DIAGNOSIS — R739 Hyperglycemia, unspecified: Secondary | ICD-10-CM | POA: Diagnosis not present

## 2019-09-30 DIAGNOSIS — Z720 Tobacco use: Secondary | ICD-10-CM | POA: Diagnosis not present

## 2019-09-30 DIAGNOSIS — Z1231 Encounter for screening mammogram for malignant neoplasm of breast: Secondary | ICD-10-CM

## 2019-09-30 DIAGNOSIS — F439 Reaction to severe stress, unspecified: Secondary | ICD-10-CM

## 2019-09-30 NOTE — Assessment & Plan Note (Signed)
Increased stress as outlined.  Discussed with her today.  Has good support.  Continue wellbutrin.  Continue f/u with therapist.  Has vistaril to help her sleep.  Follow.

## 2019-09-30 NOTE — Assessment & Plan Note (Signed)
Low carb diet and exercise.  Follow met b and a1c.  

## 2019-09-30 NOTE — Assessment & Plan Note (Signed)
Continues to smoke.  Continue wellbutrin.  Follow.

## 2019-09-30 NOTE — Assessment & Plan Note (Signed)
Intolerant to crestor.  Low cholesterol diet and exercise. Follow lipid panel.   

## 2019-09-30 NOTE — Progress Notes (Signed)
[name], female   DOB: 03-16-1966, 53 y.o.   MRN: [id number]   Virtual Visit via video Note This visit occurred during the SARS-CoV-2 public health emergency.  Safety protocols were in place, including screening questions prior to the visit, additional usage of staff PPE, and extensive cleaning of exam room while observing appropriate contact time as indicated for disinfecting solutions.  This visit type was conducted due to national recommendations for restrictions regarding the COVID-19 pandemic (e.g. social distancing).  This format is felt to be most appropriate for this patient at this time.  All issues noted in this document were discussed and addressed.  No physical exam was performed (except for noted visual exam findings with Video Visits).   I connected with [name] by a video enabled telemedicine application and verified that I am speaking with the correct person using two identifiers. Location patient: home Location provider: work Persons participating in the virtual visit: patient, provider  I discussed the limitations, risks, security and privacy concerns of performing an evaluation and management service by video and the availability of in person appointments. The patient expressed understanding and agreed to proceed.   Reason for visit: scheduled follow up.   HPI: Increased stress.  Brother recently passed away.  Has good family support.  Seeing a counselor Technical brewer Simond).   Still with increased stress, but overall feels she is managing.  Also has stress with the kids and work.  Taking wellbutrin.  Has vistaril to take to help her sleep.  Still smoking approximately one ppd.  Does not smoke at work.  Feels like now is not a good time to try to stop.  Recent breaking out of her face.  Has TCC (cream) to use.  Also takes vistaril. Did not report any chest pain or sob.  Eating and drinking.  No abdominal pain or bowel change reported.  Feels she is  - emotionally eating.      ROS: See pertinent positives and negatives per HPI.  Past Medical History:  Diagnosis Date  . Hyperlipidemia   . Hypertension     Past Surgical History:  Procedure Laterality Date  . VAGINAL DELIVERY  1989   episiotomy, Dr. Arsenio Loader    Family History  Problem Relation Age of Onset  . Diabetes Mother   . Dementia Mother   . Stroke Father 4  . Hypertension Sister   . Thyroid disease Sister   . Diabetes Sister   . Hypertension Brother   . Diabetes Brother   . Hypertension Sister   . Hypertension Brother   . Hypertension Brother   . Hypertension Brother   . Hypertension Sister   . Breast cancer Neg Hx   . Lupus Neg Hx     SOCIAL HX: reviewed.    Current Outpatient Medications:  .  buPROPion (WELLBUTRIN XL) 150 MG 24 hr tablet, TAKE 1 TABLET BY MOUTH TWICE DAILY, Disp: 180 tablet, Rfl: 1 .  hydrochlorothiazide (HYDRODIURIL) 25 MG tablet, TAKE 1 TABLET BY MOUTH DAILY, Disp: 90 tablet, Rfl: 2 .  hydrOXYzine (ATARAX/VISTARIL) 10 MG tablet, Take 1 tablet (10 mg total) by mouth at bedtime as needed for itching or anxiety., Disp: 30 tablet, Rfl: 1 .  Multiple Vitamin (MULTIVITAMIN WITH MINERALS) TABS tablet, Take 1 tablet by mouth daily., Disp: , Rfl:  .  triamcinolone (KENALOG) 0.025 % cream, Apply 1 application topically 2 (two) times daily., Disp: 80 g, Rfl: 0 .  Turmeric 500 MG CAPS, Take 500 mg by  mouth once., Disp: , Rfl:  .  vitamin B-12 (CYANOCOBALAMIN) 500 MCG tablet, Take 500 mcg by mouth daily., Disp: , Rfl:   EXAM:  GENERAL: alert, oriented, appears well and in no acute distress  HEENT: atraumatic, conjunttiva clear, no obvious abnormalities on inspection of external nose and ears  NECK: normal movements of the head and neck  LUNGS: on inspection no signs of respiratory distress, breathing rate appears normal, no obvious gross SOB, gasping or wheezing  CV: no obvious cyanosis  PSYCH/NEURO: pleasant and cooperative, no obvious  depression or anxiety, speech and thought processing grossly intact  ASSESSMENT AND PLAN:  Discussed the following assessment and plan:  Hypercholesterolemia Intolerant to crestor.  Low cholesterol diet and exercise.  Follow lipid panel.   Hyperglycemia Low carb diet and exercise.  Follow met b and a1c.   Hypertension Blood pressure under good control.  Continue same medication regimen.  Follow pressures.  Follow metabolic panel.    Screening for breast cancer Mammogram 08/25/19 - Birads I.   Stress Increased stress as outlined.  Discussed with her today.  Has good support.  Continue wellbutrin.  Continue f/u with therapist.  Has vistaril to help her sleep.  Follow.    Tobacco use Continues to smoke.  Continue wellbutrin.  Follow.      I discussed the assessment and treatment plan with the patient. The patient was provided an opportunity to ask questions and all were answered. The patient agreed with the plan and demonstrated an understanding of the instructions.   The patient was advised to call back or seek an in-person evaluation if the symptoms worsen or if the condition fails to improve as anticipated.   Einar Pheasant, MD

## 2019-09-30 NOTE — Assessment & Plan Note (Signed)
Mammogram 08/25/19 - Birads I.  

## 2019-09-30 NOTE — Assessment & Plan Note (Signed)
Blood pressure under good control.  Continue same medication regimen.  Follow pressures.  Follow metabolic panel.   

## 2019-10-26 ENCOUNTER — Other Ambulatory Visit: Payer: Self-pay | Admitting: Internal Medicine

## 2019-11-01 ENCOUNTER — Other Ambulatory Visit: Payer: Self-pay

## 2019-11-01 ENCOUNTER — Other Ambulatory Visit (INDEPENDENT_AMBULATORY_CARE_PROVIDER_SITE_OTHER): Payer: 59

## 2019-11-01 DIAGNOSIS — R739 Hyperglycemia, unspecified: Secondary | ICD-10-CM | POA: Diagnosis not present

## 2019-11-01 DIAGNOSIS — I10 Essential (primary) hypertension: Secondary | ICD-10-CM

## 2019-11-01 DIAGNOSIS — E7800 Pure hypercholesterolemia, unspecified: Secondary | ICD-10-CM | POA: Diagnosis not present

## 2019-11-01 LAB — CBC WITH DIFFERENTIAL/PLATELET
Basophils Absolute: 0 10*3/uL (ref 0.0–0.1)
Basophils Relative: 0.2 % (ref 0.0–3.0)
Eosinophils Absolute: 0.2 10*3/uL (ref 0.0–0.7)
Eosinophils Relative: 3.7 % (ref 0.0–5.0)
HCT: 39.6 % (ref 36.0–46.0)
Hemoglobin: 13.4 g/dL (ref 12.0–15.0)
Lymphocytes Relative: 31.1 % (ref 12.0–46.0)
Lymphs Abs: 1.8 10*3/uL (ref 0.7–4.0)
MCHC: 33.7 g/dL (ref 30.0–36.0)
MCV: 93.5 fl (ref 78.0–100.0)
Monocytes Absolute: 0.4 10*3/uL (ref 0.1–1.0)
Monocytes Relative: 7.9 % (ref 3.0–12.0)
Neutro Abs: 3.2 10*3/uL (ref 1.4–7.7)
Neutrophils Relative %: 57.1 % (ref 43.0–77.0)
Platelets: 252 10*3/uL (ref 150.0–400.0)
RBC: 4.24 Mil/uL (ref 3.87–5.11)
RDW: 13.2 % (ref 11.5–15.5)
WBC: 5.6 10*3/uL (ref 4.0–10.5)

## 2019-11-01 LAB — BASIC METABOLIC PANEL
BUN: 11 mg/dL (ref 6–23)
CO2: 28 mEq/L (ref 19–32)
Calcium: 9.3 mg/dL (ref 8.4–10.5)
Chloride: 102 mEq/L (ref 96–112)
Creatinine, Ser: 0.81 mg/dL (ref 0.40–1.20)
GFR: 89.67 mL/min (ref >60.00)
Glucose, Bld: 119 mg/dL — ABNORMAL HIGH (ref 70–99)
Potassium: 4.1 mEq/L (ref 3.5–5.1)
Sodium: 137 mEq/L (ref 135–145)

## 2019-11-01 LAB — LIPID PANEL
Cholesterol: 242 mg/dL — ABNORMAL HIGH (ref 0–200)
HDL: 66 mg/dL (ref >39.00)
LDL Cholesterol: 158 mg/dL — ABNORMAL HIGH (ref 0–99)
NonHDL: 175.7
Total CHOL/HDL Ratio: 4
Triglycerides: 87 mg/dL (ref 0.0–149.0)
VLDL: 17.4 mg/dL (ref 0.0–40.0)

## 2019-11-01 LAB — HEPATIC FUNCTION PANEL
ALT: 24 U/L (ref 0–35)
AST: 21 U/L (ref 0–37)
Albumin: 4.3 g/dL (ref 3.5–5.2)
Alkaline Phosphatase: 58 U/L (ref 39–117)
Bilirubin, Direct: 0.1 mg/dL (ref 0.0–0.3)
Total Bilirubin: 0.3 mg/dL (ref 0.2–1.2)
Total Protein: 6.7 g/dL (ref 6.0–8.3)

## 2019-11-01 LAB — HEMOGLOBIN A1C: Hgb A1c MFr Bld: 5.9 % (ref 4.6–6.5)

## 2019-11-05 ENCOUNTER — Other Ambulatory Visit: Payer: Self-pay

## 2019-11-05 MED ORDER — ATORVASTATIN CALCIUM 10 MG PO TABS: 10.0000 mg | ORAL_TABLET | ORAL | 0 refills | Status: DC

## 2019-12-30 ENCOUNTER — Telehealth: Payer: Self-pay | Admitting: *Deleted

## 2019-12-30 DIAGNOSIS — E7800 Pure hypercholesterolemia, unspecified: Secondary | ICD-10-CM

## 2019-12-30 NOTE — Telephone Encounter (Signed)
Order placed for f/u lab.   

## 2019-12-30 NOTE — Telephone Encounter (Signed)
Please place future orders for lab appt.  

## 2019-12-31 ENCOUNTER — Other Ambulatory Visit: Payer: 59

## 2020-01-06 ENCOUNTER — Other Ambulatory Visit: Payer: Self-pay | Admitting: Internal Medicine

## 2020-01-17 ENCOUNTER — Other Ambulatory Visit (INDEPENDENT_AMBULATORY_CARE_PROVIDER_SITE_OTHER): Payer: 59

## 2020-01-17 ENCOUNTER — Other Ambulatory Visit: Payer: Self-pay

## 2020-01-17 DIAGNOSIS — E7800 Pure hypercholesterolemia, unspecified: Secondary | ICD-10-CM | POA: Diagnosis not present

## 2020-01-17 LAB — HEPATIC FUNCTION PANEL
ALT: 34 U/L (ref 0–35)
AST: 27 U/L (ref 0–37)
Albumin: 4.4 g/dL (ref 3.5–5.2)
Alkaline Phosphatase: 57 U/L (ref 39–117)
Bilirubin, Direct: 0 mg/dL (ref 0.0–0.3)
Total Bilirubin: 0.3 mg/dL (ref 0.2–1.2)
Total Protein: 7.1 g/dL (ref 6.0–8.3)

## 2020-01-18 ENCOUNTER — Encounter: Payer: Self-pay | Admitting: Internal Medicine

## 2020-01-31 ENCOUNTER — Encounter: Payer: Self-pay | Admitting: Internal Medicine

## 2020-01-31 ENCOUNTER — Other Ambulatory Visit: Payer: Self-pay

## 2020-01-31 ENCOUNTER — Ambulatory Visit: Payer: 59 | Admitting: Internal Medicine

## 2020-01-31 DIAGNOSIS — E7800 Pure hypercholesterolemia, unspecified: Secondary | ICD-10-CM

## 2020-01-31 DIAGNOSIS — F439 Reaction to severe stress, unspecified: Secondary | ICD-10-CM

## 2020-01-31 DIAGNOSIS — I10 Essential (primary) hypertension: Secondary | ICD-10-CM

## 2020-01-31 DIAGNOSIS — Z720 Tobacco use: Secondary | ICD-10-CM

## 2020-01-31 DIAGNOSIS — E0789 Other specified disorders of thyroid: Secondary | ICD-10-CM

## 2020-01-31 DIAGNOSIS — R739 Hyperglycemia, unspecified: Secondary | ICD-10-CM

## 2020-01-31 DIAGNOSIS — K219 Gastro-esophageal reflux disease without esophagitis: Secondary | ICD-10-CM

## 2020-01-31 MED ORDER — AMLODIPINE BESYLATE 2.5 MG PO TABS: 2.5000 mg | ORAL_TABLET | Freq: Every day | ORAL | 2 refills | Status: DC

## 2020-01-31 NOTE — Progress Notes (Signed)
[name], female   DOB: Mar 07, 1966, 54 y.o.   MRN: [id number]   Subjective:    [name], female    DOB: 1966-01-21, 54 y.o.   MRN: [id number]  HPI This visit occurred during the SARS-CoV-2 public health emergency.  Safety protocols were in place, including screening questions prior to the visit, additional usage of staff PPE, and extensive cleaning of exam room while observing appropriate contact time as indicated for disinfecting solutions.  Patient here for a scheduled follow up.  She reports she is doing relatively well.  Increased stress.  Discussed with her today.  On wellbutrin.  Overall she feels she is handling things relatively well.  Discussed diet and exercise.  No chest pain or sob reported.  Increases acid reflux.  Some issues with swallowing.  Discussed starting pepcid on a regular basis.  No abdominal pain.  Bowels moving.  Fullness - thyroid.  Persistent elevated blood pressure. lipitor caused leg cramps.      Past Medical History:  Diagnosis Date  . Hyperlipidemia   . Hypertension    Past Surgical History:  Procedure Laterality Date  . VAGINAL DELIVERY  1989   episiotomy, Dr. Arsenio Loader   Family History  Problem Relation Age of Onset  . Diabetes Mother   . Dementia Mother   . Stroke Father 79  . Hypertension Sister   . Thyroid disease Sister   . Diabetes Sister   . Hypertension Brother   . Diabetes Brother   . Hypertension Sister   . Hypertension Brother   . Hypertension Brother   . Hypertension Brother   . Hypertension Sister   . Breast cancer Neg Hx   . Lupus Neg Hx    Social History   Socioeconomic History  . Marital status: Single    Spouse name: Not on file  . Number of children: Not on file  . Years of education: Not on file  . Highest education level: Not on file  Occupational History  . Not on file  Tobacco Use  . Smoking status: Current Some Day Smoker    Packs/day: 0.50    Years: 15.00    Pack  years: 7.50    Types: Cigarettes  . Smokeless tobacco: Never Used  Substance and Sexual Activity  . Alcohol use: Yes    Alcohol/week: 0.0 standard drinks  . Drug use: No  . Sexual activity: Not on file  Other Topics Concern  . Not on file  Social History Narrative   Lives in [place]. 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      Diet: regular diet, limits fried food   Exercise: walks goal 45mn daily   Social Determinants of Health   Financial Resource Strain:   . Difficulty of Paying Living Expenses:   Food Insecurity:   . Worried About RCharity fundraiserin the Last Year:   . RArboriculturistin the Last Year:   Transportation Needs:   . LFilm/video editor(Medical):   .Marland KitchenLack of Transportation (Non-Medical):   Physical Activity:   . Days of Exercise per Week:   . Minutes of Exercise per Session:   Stress:   . Feeling of Stress :   Social Connections:   . Frequency of Communication with Friends and Family:   . Frequency of Social Gatherings with Friends and Family:   . Attends Religious Services:   . Active Member of Clubs or Organizations:   .  Attends Archivist Meetings:   Marland Kitchen Marital Status:     Outpatient Encounter Medications as of 01/31/2020  Medication Sig  . amLODipine (NORVASC) 2.5 MG tablet Take 1 tablet (2.5 mg total) by mouth daily.  Marland Kitchen buPROPion (WELLBUTRIN XL) 150 MG 24 hr tablet TAKE 1 TABLET BY MOUTH TWICE DAILY  . hydrochlorothiazide (HYDRODIURIL) 25 MG tablet TAKE 1 TABLET BY MOUTH DAILY  . hydrOXYzine (ATARAX/VISTARIL) 10 MG tablet Take 1 tablet (10 mg total) by mouth at bedtime as needed for itching or anxiety.  . Multiple Vitamin (MULTIVITAMIN WITH MINERALS) TABS tablet Take 1 tablet by mouth daily.  Marland Kitchen triamcinolone (KENALOG) 0.025 % cream Apply 1 application topically 2 (two) times daily.  . Turmeric 500 MG CAPS Take 500 mg by mouth once.  . vitamin B-12 (CYANOCOBALAMIN) 500 MCG tablet Take 500 mcg by mouth  daily.  . [DISCONTINUED] atorvastatin (LIPITOR) 10 MG tablet Take 1 tablet (10 mg total) by mouth every Monday, Wednesday, and Friday.   No facility-administered encounter medications on file as of 01/31/2020.    Review of Systems  Constitutional: Negative for appetite change and fever.  HENT: Negative for congestion and sinus pressure.   Respiratory: Negative for cough, chest tightness and shortness of breath.   Cardiovascular: Negative for chest pain, palpitations and leg swelling.  Gastrointestinal: Negative for abdominal pain, diarrhea, nausea and vomiting.       Acid reflux as outlined.    Genitourinary: Negative for difficulty urinating and dysuria.  Musculoskeletal: Negative for joint swelling and myalgias.  Skin: Negative for color change and rash.  Neurological: Negative for dizziness, light-headedness and headaches.  Psychiatric/Behavioral: Negative for agitation and dysphoric mood.       Increased stress as outlined.         Objective:    Physical Exam Vitals reviewed.  Constitutional:      General: She is not in acute distress.    Appearance: Normal appearance.  HENT:     Head: Normocephalic and atraumatic.     Right Ear: External ear normal.     Left Ear: External ear normal.  Eyes:     General: No scleral icterus.       Right eye: No discharge.        Left eye: No discharge.  Neck:     Thyroid: No thyromegaly.  Cardiovascular:     Rate and Rhythm: Normal rate and regular rhythm.  Pulmonary:     Effort: No respiratory distress.     Breath sounds: Normal breath sounds. No wheezing.  Abdominal:     General: Bowel sounds are normal.     Palpations: Abdomen is soft.     Tenderness: There is no abdominal tenderness.  Musculoskeletal:        General: No swelling or tenderness.     Cervical back: Neck supple. No tenderness.  Lymphadenopathy:     Cervical: No cervical adenopathy.  Skin:    Findings: No erythema or rash.  Neurological:     Mental Status: She  is alert.  Psychiatric:        Mood and Affect: Mood normal.        Behavior: Behavior normal.     BP 138/80   Pulse 79   Temp (!) 96.7 F (35.9 C)   Resp 16   Ht 5' 11"  (1.803 m)   Wt 256 lb 9.6 oz (116.4 kg)   SpO2 99%   BMI 35.79 kg/m  Wt Readings from Last 3 Encounters:  01/31/20 256 lb 9.6 oz (116.4 kg)  09/30/19 237 lb (107.5 kg)  05/07/19 237 lb 3.2 oz (107.6 kg)     Lab Results  Component Value Date   WBC 5.6 11/01/2019   HGB 13.4 11/01/2019   HCT 39.6 11/01/2019   PLT 252.0 11/01/2019   GLUCOSE 119 (H) 11/01/2019   CHOL 242 (H) 11/01/2019   TRIG 87.0 11/01/2019   HDL 66.00 11/01/2019   LDLCALC 158 (H) 11/01/2019   ALT 34 01/17/2020   AST 27 01/17/2020   NA 137 11/01/2019   K 4.1 11/01/2019   CL 102 11/01/2019   CREATININE 0.81 11/01/2019   BUN 11 11/01/2019   CO2 28 11/01/2019   TSH 0.48 12/07/2018   HGBA1C 5.9 11/01/2019    MM 3D SCREEN BREAST BILATERAL  Result Date: 08/25/2019 CLINICAL DATA:  Screening. EXAM: DIGITAL SCREENING BILATERAL MAMMOGRAM WITH TOMO AND CAD COMPARISON:  Previous exam(s). ACR Breast Density Category b: There are scattered areas of fibroglandular density. FINDINGS: There are no findings suspicious for malignancy. Images were processed with CAD. IMPRESSION: No mammographic evidence of malignancy. A result letter of this screening mammogram will be mailed directly to the patient. RECOMMENDATION: Screening mammogram in one year. (Code:SM-B-01Y) BI-RADS CATEGORY  1: Negative. Electronically Signed   By: Lillia Mountain M.D.   On: 08/25/2019 14:59       Assessment & Plan:   Problem List Items Addressed This Visit    GERD (gastroesophageal reflux disease)    Acid reflux as outlined.  Start pepcid.  Follow.        Hypercholesterolemia    Intolerant to lipitor and crestor.  Low cholesterol diet and exercise.  Follow lipid panel and liver function tests.        Relevant Medications   amLODipine (NORVASC) 2.5 MG tablet   Other  Relevant Orders   Lipid panel   Hepatic function panel   Hyperglycemia    Low carb diet and exercise.  Follow met b and a1c.       Relevant Orders   Hemoglobin A1c   Hypertension    Blood pressure under elevated above goal.  Add amlodipine 2.19m q day.   Continue hctz.   Follow pressures.  Follow metabolic panel.        Relevant Medications   amLODipine (NORVASC) 2.5 MG tablet   Other Relevant Orders   Basic metabolic panel   Stress    Increased stress as outlined.  On wellbutrin.  Discussed.  Follow.  Overall handling things relatively well.  Follow.        Thyroid fullness    Was evaluated by Dr PEddie Dibbles  Schedule thyroid ultrasound.        Relevant Orders   [place]YROID   TSH   Tobacco use    Continues to smoke.  Have discussed the need to quit.  Follow.            CEinar Pheasant MD

## 2020-01-31 NOTE — Patient Instructions (Signed)
pepcid 20mg  - take one tablet before your evening meal.

## 2020-02-05 ENCOUNTER — Encounter: Payer: Self-pay | Admitting: Internal Medicine

## 2020-02-05 DIAGNOSIS — K219 Gastro-esophageal reflux disease without esophagitis: Secondary | ICD-10-CM | POA: Insufficient documentation

## 2020-02-05 NOTE — Assessment & Plan Note (Signed)
Was evaluated by Dr Eddie Dibbles.  Schedule thyroid ultrasound.

## 2020-02-05 NOTE — Assessment & Plan Note (Signed)
Continues to smoke.  Have discussed the need to quit.  Follow.

## 2020-02-05 NOTE — Assessment & Plan Note (Signed)
Low carb diet and exercise.  Follow met b and a1c.  

## 2020-02-05 NOTE — Assessment & Plan Note (Signed)
Blood pressure under elevated above goal.  Add amlodipine 2.5mg  q day.   Continue hctz.   Follow pressures.  Follow metabolic panel.

## 2020-02-05 NOTE — Assessment & Plan Note (Signed)
Intolerant to lipitor and crestor.  Low cholesterol diet and exercise.  Follow lipid panel and liver function tests.

## 2020-02-05 NOTE — Assessment & Plan Note (Signed)
Acid reflux as outlined.  Start pepcid.  Follow.

## 2020-02-05 NOTE — Assessment & Plan Note (Signed)
Increased stress as outlined.  On wellbutrin.  Discussed.  Follow.  Overall handling things relatively well.  Follow.

## 2020-02-07 ENCOUNTER — Telehealth: Payer: Self-pay | Admitting: Internal Medicine

## 2020-02-07 NOTE — Telephone Encounter (Signed)
I left vm for pt to call ofc.regarding to sch US Thyroid

## 2020-03-07 ENCOUNTER — Ambulatory Visit: Payer: 59

## 2020-03-07 ENCOUNTER — Other Ambulatory Visit: Payer: Self-pay

## 2020-04-06 ENCOUNTER — Other Ambulatory Visit (INDEPENDENT_AMBULATORY_CARE_PROVIDER_SITE_OTHER): Payer: 59

## 2020-04-06 ENCOUNTER — Other Ambulatory Visit: Payer: Self-pay

## 2020-04-06 DIAGNOSIS — I10 Essential (primary) hypertension: Secondary | ICD-10-CM

## 2020-04-06 DIAGNOSIS — E0789 Other specified disorders of thyroid: Secondary | ICD-10-CM | POA: Diagnosis not present

## 2020-04-06 DIAGNOSIS — E7800 Pure hypercholesterolemia, unspecified: Secondary | ICD-10-CM

## 2020-04-06 DIAGNOSIS — R739 Hyperglycemia, unspecified: Secondary | ICD-10-CM

## 2020-04-06 LAB — HEPATIC FUNCTION PANEL
ALT: 27 U/L (ref 0–35)
AST: 22 U/L (ref 0–37)
Albumin: 4.2 g/dL (ref 3.5–5.2)
Alkaline Phosphatase: 62 U/L (ref 39–117)
Bilirubin, Direct: 0.1 mg/dL (ref 0.0–0.3)
Total Bilirubin: 0.8 mg/dL (ref 0.2–1.2)
Total Protein: 6.9 g/dL (ref 6.0–8.3)

## 2020-04-06 LAB — LIPID PANEL
Cholesterol: 211 mg/dL — ABNORMAL HIGH (ref 0–200)
HDL: 74.6 mg/dL (ref >39.00)
LDL Cholesterol: 115 mg/dL — ABNORMAL HIGH (ref 0–99)
NonHDL: 136.78
Total CHOL/HDL Ratio: 3
Triglycerides: 110 mg/dL (ref 0.0–149.0)
VLDL: 22 mg/dL (ref 0.0–40.0)

## 2020-04-06 LAB — TSH: TSH: 0.73 u[IU]/mL (ref 0.35–4.50)

## 2020-04-06 LAB — BASIC METABOLIC PANEL
BUN: 10 mg/dL (ref 6–23)
CO2: 25 mEq/L (ref 19–32)
Calcium: 9 mg/dL (ref 8.4–10.5)
Chloride: 105 mEq/L (ref 96–112)
Creatinine, Ser: 0.75 mg/dL (ref 0.40–1.20)
GFR: 97.84 mL/min (ref >60.00)
Glucose, Bld: 98 mg/dL (ref 70–99)
Potassium: 3.9 mEq/L (ref 3.5–5.1)
Sodium: 138 mEq/L (ref 135–145)

## 2020-04-06 LAB — HEMOGLOBIN A1C: Hgb A1c MFr Bld: 6 % (ref 4.6–6.5)

## 2020-04-10 ENCOUNTER — Other Ambulatory Visit: Payer: Self-pay

## 2020-04-10 ENCOUNTER — Ambulatory Visit: Payer: 59 | Admitting: Internal Medicine

## 2020-04-10 DIAGNOSIS — F439 Reaction to severe stress, unspecified: Secondary | ICD-10-CM

## 2020-04-10 DIAGNOSIS — R739 Hyperglycemia, unspecified: Secondary | ICD-10-CM

## 2020-04-10 DIAGNOSIS — K219 Gastro-esophageal reflux disease without esophagitis: Secondary | ICD-10-CM | POA: Diagnosis not present

## 2020-04-10 DIAGNOSIS — E0789 Other specified disorders of thyroid: Secondary | ICD-10-CM

## 2020-04-10 DIAGNOSIS — I10 Essential (primary) hypertension: Secondary | ICD-10-CM | POA: Diagnosis not present

## 2020-04-10 DIAGNOSIS — E7800 Pure hypercholesterolemia, unspecified: Secondary | ICD-10-CM

## 2020-04-10 MED ORDER — PANTOPRAZOLE SODIUM 40 MG PO TBEC: 40.0000 mg | DELAYED_RELEASE_TABLET | Freq: Every day | ORAL | 2 refills | Status: DC

## 2020-04-10 NOTE — Progress Notes (Signed)
[name], female   DOB: April 17, 1966, 54 y.o.   MRN: [id number]   Subjective:    [name], female    DOB: 1966/07/13, 54 y.o.   MRN: [id number]  HPI This visit occurred during the SARS-CoV-2 public health emergency.  Safety protocols were in place, including screening questions prior to the visit, additional usage of staff PPE, and extensive cleaning of exam room while observing appropriate contact time as indicated for disinfecting solutions.  Patient here for a scheduled follow up.  She reports she is doing relatively well.  Has not been in a regular exercise routine, but plans to return to the gym this summer.  Increased stress discussed.  Overall handling things relatively well.  Has good support.  No chest pain or sob reported.  No abdominal pain or bowel change reported.  Discussed labs.  Cholesterol improved.  Started amlodipine last visit.  Blood pressure better.  Some acid reflux.  Started prilosec.  If takes regularly, controls.  Has only taken for fourteen days.  Discussed restarting.  Unable to do thyroid ultrasound.  Needs to get her insurance coverage confirmed.    Past Medical History:  Diagnosis Date  . Hyperlipidemia   . Hypertension    Past Surgical History:  Procedure Laterality Date  . VAGINAL DELIVERY  1989   episiotomy, Dr. Arsenio Loader   Family History  Problem Relation Age of Onset  . Diabetes Mother   . Dementia Mother   . Stroke Father 89  . Hypertension Sister   . Thyroid disease Sister   . Diabetes Sister   . Hypertension Brother   . Diabetes Brother   . Hypertension Sister   . Hypertension Brother   . Hypertension Brother   . Hypertension Brother   . Hypertension Sister   . Breast cancer Neg Hx   . Lupus Neg Hx    Social History   Socioeconomic History  . Marital status: Single    Spouse name: Not on file  . Number of children: Not on file  . Years of education: Not on file  . Highest education level: Not  on file  Occupational History  . Not on file  Tobacco Use  . Smoking status: Current Some Day Smoker    Packs/day: 0.50    Years: 15.00    Pack years: 7.50    Types: Cigarettes  . Smokeless tobacco: Never Used  Substance and Sexual Activity  . Alcohol use: Yes    Alcohol/week: 0.0 standard drinks  . Drug use: No  . Sexual activity: Not on file  Other Topics Concern  . Not on file  Social History Narrative   Lives in [place]. 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      Diet: regular diet, limits fried food   Exercise: walks goal 37mn daily   Social Determinants of Health   Financial Resource Strain:   . Difficulty of Paying Living Expenses:   Food Insecurity:   . Worried About RCharity fundraiserin the Last Year:   . RArboriculturistin the Last Year:   Transportation Needs:   . LFilm/video editor(Medical):   .Marland KitchenLack of Transportation (Non-Medical):   Physical Activity:   . Days of Exercise per Week:   . Minutes of Exercise per Session:   Stress:   . Feeling of Stress :   Social Connections:   . Frequency of Communication with Friends and Family:   .  Frequency of Social Gatherings with Friends and Family:   . Attends Religious Services:   . Active Member of Clubs or Organizations:   . Attends Archivist Meetings:   Marland Kitchen Marital Status:     Outpatient Encounter Medications as of 04/10/2020  Medication Sig  . amLODipine (NORVASC) 2.5 MG tablet Take 1 tablet (2.5 mg total) by mouth daily.  . hydrochlorothiazide (HYDRODIURIL) 25 MG tablet TAKE 1 TABLET BY MOUTH DAILY  . hydrOXYzine (ATARAX/VISTARIL) 10 MG tablet Take 1 tablet (10 mg total) by mouth at bedtime as needed for itching or anxiety.  . Multiple Vitamin (MULTIVITAMIN WITH MINERALS) TABS tablet Take 1 tablet by mouth daily.  . pantoprazole (PROTONIX) 40 MG tablet Take 1 tablet (40 mg total) by mouth daily.  Marland Kitchen triamcinolone (KENALOG) 0.025 % cream Apply 1 application  topically 2 (two) times daily.  . Turmeric 500 MG CAPS Take 500 mg by mouth once.  . vitamin B-12 (CYANOCOBALAMIN) 500 MCG tablet Take 500 mcg by mouth daily.  . [DISCONTINUED] buPROPion (WELLBUTRIN XL) 150 MG 24 hr tablet TAKE 1 TABLET BY MOUTH TWICE DAILY   No facility-administered encounter medications on file as of 04/10/2020.    Review of Systems  Constitutional: Negative for appetite change and unexpected weight change.  HENT: Negative for congestion and sinus pressure.   Respiratory: Negative for cough, chest tightness and shortness of breath.   Cardiovascular: Negative for chest pain, palpitations and leg swelling.  Gastrointestinal: Negative for abdominal pain, diarrhea, nausea and vomiting.       Some acid reflux.    Genitourinary: Negative for difficulty urinating and dysuria.  Musculoskeletal: Negative for joint swelling and myalgias.  Skin: Negative for color change and rash.  Neurological: Negative for dizziness, light-headedness and headaches.  Psychiatric/Behavioral: Negative for agitation and dysphoric mood.       Objective:    Physical Exam Vitals reviewed.  Constitutional:      General: She is not in acute distress.    Appearance: Normal appearance.  HENT:     Head: Normocephalic and atraumatic.     Right Ear: External ear normal.     Left Ear: External ear normal.  Eyes:     General:        Right eye: No discharge.        Left eye: No discharge.     Conjunctiva/sclera: Conjunctivae normal.  Neck:     Thyroid: No thyromegaly.  Cardiovascular:     Rate and Rhythm: Normal rate and regular rhythm.  Pulmonary:     Effort: No respiratory distress.     Breath sounds: Normal breath sounds. No wheezing.  Abdominal:     General: Bowel sounds are normal.     Palpations: Abdomen is soft.     Tenderness: There is no abdominal tenderness.  Musculoskeletal:        General: No swelling or tenderness.     Cervical back: Neck supple. No tenderness.    Lymphadenopathy:     Cervical: No cervical adenopathy.  Skin:    Findings: No erythema or rash.  Neurological:     Mental Status: She is alert.  Psychiatric:        Mood and Affect: Mood normal.        Behavior: Behavior normal.     BP 122/78   Pulse 78   Temp (!) 97 F (36.1 C)   Resp 16   Ht _0  (1.803 m)   Wt 249 lb (112.9 kg)  SpO2 99%   BMI 34.73 kg/m  Wt Readings from Last 3 Encounters:  04/10/20 249 lb (112.9 kg)  01/31/20 256 lb 9.6 oz (116.4 kg)  09/30/19 237 lb (107.5 kg)     Lab Results  Component Value Date   WBC 5.6 11/01/2019   HGB 13.4 11/01/2019   HCT 39.6 11/01/2019   PLT 252.0 11/01/2019   GLUCOSE 98 04/06/2020   CHOL 211 (H) 04/06/2020   TRIG 110.0 04/06/2020   HDL 74.60 04/06/2020   LDLCALC 115 (H) 04/06/2020   ALT 27 04/06/2020   AST 22 04/06/2020   NA 138 04/06/2020   K 3.9 04/06/2020   CL 105 04/06/2020   CREATININE 0.75 04/06/2020   BUN 10 04/06/2020   CO2 25 04/06/2020   TSH 0.73 04/06/2020   HGBA1C 6.0 04/06/2020    MM 3D SCREEN BREAST BILATERAL  Result Date: 08/25/2019 CLINICAL DATA:  Screening. EXAM: DIGITAL SCREENING BILATERAL MAMMOGRAM WITH TOMO AND CAD COMPARISON:  Previous exam(s). ACR Breast Density Category b: There are scattered areas of fibroglandular density. FINDINGS: There are no findings suspicious for malignancy. Images were processed with CAD. IMPRESSION: No mammographic evidence of malignancy. A result letter of this screening mammogram will be mailed directly to the patient. RECOMMENDATION: Screening mammogram in one year. (Code:SM-B-01Y) BI-RADS CATEGORY  1: Negative. Electronically Signed   By: Lillia Mountain M.D.   On: 08/25/2019 14:59       Assessment & Plan:   Problem List Items Addressed This Visit    GERD (gastroesophageal reflux disease)    Some acid reflux.  Start protonix.  Stop pepcid.  Follow.        Relevant Medications   pantoprazole (PROTONIX) 40 MG tablet   Hypercholesterolemia     Intolerant to lipitor and crestor.  Diet and exercise.  Follow lipid panel.   Lab Results  Component Value Date   CHOL 211 (H) 04/06/2020   HDL 74.60 04/06/2020   LDLCALC 115 (H) 04/06/2020   TRIG 110.0 04/06/2020   CHOLHDL 3 04/06/2020        Hyperglycemia    Low carb diet and exercise.  Follow met b and a1c.       Hypertension    Added amlodipine previous visit.  On hctz as well.  Blood pressure doing well.  Continue current medications.  Follow pressures.  Follow metabolic panel.        Stress    Increased stress.  Discussed with her today.  Overall she feels she is handling things well.  Follow. Continue wellbutrin.       Thyroid fullness    Was evaluated previously by Dr Eddie Dibbles.  Was scheduled for thyroid ultrasound.  Unable to get secondary to insurance.  Will check with insurance and let me know if wants to schedule.            Einar Pheasant, MD

## 2020-04-13 ENCOUNTER — Other Ambulatory Visit: Payer: Self-pay | Admitting: Internal Medicine

## 2020-04-16 ENCOUNTER — Encounter: Payer: Self-pay | Admitting: Internal Medicine

## 2020-04-16 NOTE — Assessment & Plan Note (Signed)
Some acid reflux.  Start protonix.  Stop pepcid.  Follow.

## 2020-04-16 NOTE — Assessment & Plan Note (Signed)
Was evaluated previously by Dr Eddie Dibbles.  Was scheduled for thyroid ultrasound.  Unable to get secondary to insurance.  Will check with insurance and let me know if wants to schedule.

## 2020-04-16 NOTE — Assessment & Plan Note (Signed)
Intolerant to lipitor and crestor.  Diet and exercise.  Follow lipid panel.   Lab Results  Component Value Date   CHOL 211 (H) 04/06/2020   HDL 74.60 04/06/2020   LDLCALC 115 (H) 04/06/2020   TRIG 110.0 04/06/2020   CHOLHDL 3 04/06/2020

## 2020-04-16 NOTE — Assessment & Plan Note (Signed)
Added amlodipine previous visit.  On hctz as well.  Blood pressure doing well.  Continue current medications.  Follow pressures.  Follow metabolic panel.

## 2020-04-16 NOTE — Assessment & Plan Note (Signed)
Low carb diet and exercise.  Follow met b and a1c.  

## 2020-04-16 NOTE — Assessment & Plan Note (Signed)
Increased stress.  Discussed with her today.  Overall she feels she is handling things well.  Follow. Continue wellbutrin.

## 2020-05-11 ENCOUNTER — Other Ambulatory Visit: Payer: Self-pay | Admitting: Internal Medicine

## 2020-06-23 ENCOUNTER — Encounter: Payer: 59 | Admitting: Internal Medicine

## 2020-07-10 ENCOUNTER — Encounter: Payer: Self-pay | Admitting: Internal Medicine

## 2020-07-11 NOTE — Telephone Encounter (Signed)
Called and scheduled pt for 9/28 at 12

## 2020-07-18 ENCOUNTER — Other Ambulatory Visit: Payer: Self-pay | Admitting: Internal Medicine

## 2020-07-18 ENCOUNTER — Ambulatory Visit (INDEPENDENT_AMBULATORY_CARE_PROVIDER_SITE_OTHER): Payer: 59 | Admitting: Internal Medicine

## 2020-07-18 ENCOUNTER — Other Ambulatory Visit: Payer: Self-pay

## 2020-07-18 VITALS — BP 130/82 | HR 75 | Temp 98.2°F | Resp 16 | Ht 71.0 in | Wt 249.0 lb

## 2020-07-18 DIAGNOSIS — R739 Hyperglycemia, unspecified: Secondary | ICD-10-CM | POA: Diagnosis not present

## 2020-07-18 DIAGNOSIS — Z1231 Encounter for screening mammogram for malignant neoplasm of breast: Secondary | ICD-10-CM | POA: Diagnosis not present

## 2020-07-18 DIAGNOSIS — E7800 Pure hypercholesterolemia, unspecified: Secondary | ICD-10-CM

## 2020-07-18 DIAGNOSIS — K219 Gastro-esophageal reflux disease without esophagitis: Secondary | ICD-10-CM | POA: Diagnosis not present

## 2020-07-18 DIAGNOSIS — F439 Reaction to severe stress, unspecified: Secondary | ICD-10-CM

## 2020-07-18 DIAGNOSIS — I10 Essential (primary) hypertension: Secondary | ICD-10-CM

## 2020-07-18 DIAGNOSIS — Z0000 Encounter for general adult medical examination without abnormal findings: Secondary | ICD-10-CM

## 2020-07-18 LAB — HEPATIC FUNCTION PANEL
ALT: 26 U/L (ref 0–35)
AST: 20 U/L (ref 0–37)
Albumin: 4.5 g/dL (ref 3.5–5.2)
Alkaline Phosphatase: 66 U/L (ref 39–117)
Bilirubin, Direct: 0.1 mg/dL (ref 0.0–0.3)
Total Bilirubin: 0.5 mg/dL (ref 0.2–1.2)
Total Protein: 7 g/dL (ref 6.0–8.3)

## 2020-07-18 LAB — BASIC METABOLIC PANEL
BUN: 12 mg/dL (ref 6–23)
CO2: 28 mEq/L (ref 19–32)
Calcium: 9.7 mg/dL (ref 8.4–10.5)
Chloride: 101 mEq/L (ref 96–112)
Creatinine, Ser: 0.79 mg/dL (ref 0.40–1.20)
GFR: 92.04 mL/min (ref >60.00)
Glucose, Bld: 98 mg/dL (ref 70–99)
Potassium: 4 mEq/L (ref 3.5–5.1)
Sodium: 137 mEq/L (ref 135–145)

## 2020-07-18 LAB — LIPID PANEL
Cholesterol: 223 mg/dL — ABNORMAL HIGH (ref 0–200)
HDL: 63.9 mg/dL (ref >39.00)
LDL Cholesterol: 138 mg/dL — ABNORMAL HIGH (ref 0–99)
NonHDL: 159.43
Total CHOL/HDL Ratio: 3
Triglycerides: 107 mg/dL (ref 0.0–149.0)
VLDL: 21.4 mg/dL (ref 0.0–40.0)

## 2020-07-18 LAB — HEMOGLOBIN A1C: Hgb A1c MFr Bld: 5.9 % (ref 4.6–6.5)

## 2020-07-18 LAB — TSH: TSH: 0.89 u[IU]/mL (ref 0.35–4.50)

## 2020-07-18 MED ORDER — HYDROXYZINE HCL 25 MG PO TABS: 25.0000 mg | ORAL_TABLET | Freq: Every evening | ORAL | 0 refills | Status: DC | PRN

## 2020-07-18 MED ORDER — AMLODIPINE BESYLATE 2.5 MG PO TABS: 2.5000 mg | ORAL_TABLET | Freq: Every day | ORAL | 1 refills | Status: DC

## 2020-07-18 NOTE — Progress Notes (Signed)
Subjective:    [name], female    DOB: 1965/12/22, 54 y.o.   MRN: [id number]  HPI This visit occurred during the SARS-CoV-2 public health emergency.  Safety protocols were in place, including screening questions prior to the visit, additional usage of staff PPE, and extensive cleaning of exam room while observing appropriate contact time as indicated for disinfecting solutions.  Patient here for her physical exam.  She reports increased stress recently.  Grandson - in hospital with residual effects from covid.  Discussed with her today.  Has good support.  On wellbutrin.  Does not feel needs any further intervention at this time.  Trying to stay active. Has been going to the gym.  No chest pain or sob with increased activity or exertion. No acid reflux.  No abdominal pain.  Bowels moving.    Past Medical History:  Diagnosis Date  . Hyperlipidemia   . Hypertension    Past Surgical History:  Procedure Laterality Date  . VAGINAL DELIVERY  1989   episiotomy, Dr. Arsenio Loader   Family History  Problem Relation Age of Onset  . Diabetes Mother   . Dementia Mother   . Stroke Father 23  . Hypertension Sister   . Thyroid disease Sister   . Diabetes Sister   . Hypertension Brother   . Diabetes Brother   . Hypertension Sister   . Hypertension Brother   . Hypertension Brother   . Hypertension Brother   . Hypertension Sister   . Breast cancer Neg Hx   . Lupus Neg Hx    Social History   Socioeconomic History  . Marital status: Single    Spouse name: Not on file  . Number of children: Not on file  . Years of education: Not on file  . Highest education level: Not on file  Occupational History  . Not on file  Tobacco Use  . Smoking status: Current Some Day Smoker    Packs/day: 0.50    Years: 15.00    Pack years: 7.50    Types: Cigarettes  . Smokeless tobacco: Never Used  Substance and Sexual Activity  . Alcohol use: Yes    Alcohol/week: 0.0 standard drinks    . Drug use: No  . Sexual activity: Not on file  Other Topics Concern  . Not on file  Social History Narrative   Lives in [place]. 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      Diet: regular diet, limits fried food   Exercise: walks goal 22mn daily   Social Determinants of Health   Financial Resource Strain:   . Difficulty of Paying Living Expenses: Not on file  Food Insecurity:   . Worried About RCharity fundraiserin the Last Year: Not on file  . Ran Out of Food in the Last Year: Not on file  Transportation Needs:   . Lack of Transportation (Medical): Not on file  . Lack of Transportation (Non-Medical): Not on file  Physical Activity:   . Days of Exercise per Week: Not on file  . Minutes of Exercise per Session: Not on file  Stress:   . Feeling of Stress : Not on file  Social Connections:   . Frequency of Communication with Friends and Family: Not on file  . Frequency of Social Gatherings with Friends and Family: Not on file  . Attends Religious Services: Not on file  . Active Member of Clubs or Organizations: Not on file  .  Attends Archivist Meetings: Not on file  . Marital Status: Not on file    Outpatient Encounter Medications as of 07/18/2020  Medication Sig  . amLODipine (NORVASC) 2.5 MG tablet Take 1 tablet (2.5 mg total) by mouth daily.  Marland Kitchen buPROPion (WELLBUTRIN XL) 150 MG 24 hr tablet TAKE 1 TABLET BY MOUTH TWICE DAILY  . hydrochlorothiazide (HYDRODIURIL) 25 MG tablet TAKE 1 TABLET BY MOUTH DAILY  . hydrOXYzine (ATARAX/VISTARIL) 25 MG tablet Take 1 tablet (25 mg total) by mouth at bedtime as needed.  . Multiple Vitamin (MULTIVITAMIN WITH MINERALS) TABS tablet Take 1 tablet by mouth daily.  . pantoprazole (PROTONIX) 40 MG tablet Take 1 tablet (40 mg total) by mouth daily.  Marland Kitchen triamcinolone (KENALOG) 0.025 % cream Apply 1 application topically 2 (two) times daily.  . Turmeric 500 MG CAPS Take 500 mg by mouth once.  . vitamin B-12  (CYANOCOBALAMIN) 500 MCG tablet Take 500 mcg by mouth daily.  . [DISCONTINUED] amLODipine (NORVASC) 2.5 MG tablet TAKE 1 TABLET BY MOUTH ONCE DAILY  . [DISCONTINUED] hydrOXYzine (ATARAX/VISTARIL) 10 MG tablet Take 1 tablet (10 mg total) by mouth at bedtime as needed for itching or anxiety.   No facility-administered encounter medications on file as of 07/18/2020.    Review of Systems  Constitutional: Negative for appetite change and unexpected weight change.  HENT: Negative for congestion and sinus pressure.   Eyes: Negative for pain and visual disturbance.  Respiratory: Negative for cough, chest tightness and shortness of breath.   Cardiovascular: Negative for chest pain, palpitations and leg swelling.  Gastrointestinal: Negative for abdominal pain, diarrhea, nausea and vomiting.  Genitourinary: Negative for difficulty urinating and dysuria.  Musculoskeletal: Negative for joint swelling and myalgias.  Skin: Negative for color change and rash.  Neurological: Negative for dizziness, light-headedness and headaches.  Hematological: Negative for adenopathy. Does not bruise/bleed easily.  Psychiatric/Behavioral: Negative for agitation and dysphoric mood.       Increased stress as outlined.         Objective:    Physical Exam Vitals reviewed.  Constitutional:      General: She is not in acute distress. HENT:     Head: Normocephalic and atraumatic.     Right Ear: External ear normal.     Left Ear: External ear normal.  Eyes:     General: No scleral icterus.       Right eye: No discharge.        Left eye: No discharge.     Conjunctiva/sclera: Conjunctivae normal.  Neck:     Thyroid: No thyromegaly.  Cardiovascular:     Rate and Rhythm: Normal rate and regular rhythm.  Pulmonary:     Effort: No respiratory distress.     Breath sounds: Normal breath sounds. No wheezing.  Abdominal:     General: Bowel sounds are normal.     Palpations: Abdomen is soft.     Tenderness: There is no  abdominal tenderness.  Musculoskeletal:        General: No swelling or tenderness.     Cervical back: Neck supple. No tenderness.  Lymphadenopathy:     Cervical: No cervical adenopathy.  Skin:    Findings: No erythema or rash.  Neurological:     Mental Status: She is alert.  Psychiatric:        Mood and Affect: Mood normal.        Behavior: Behavior normal.     BP 130/82   Pulse 75   Temp  98.2 F (36.8 C)   Resp 16   Ht _0  (1.803 m)   Wt 249 lb (112.9 kg)   SpO2 97%   BMI 34.73 kg/m  Wt Readings from Last 3 Encounters:  07/18/20 249 lb (112.9 kg)  04/10/20 249 lb (112.9 kg)  01/31/20 256 lb 9.6 oz (116.4 kg)     Lab Results  Component Value Date   WBC 5.6 11/01/2019   HGB 13.4 11/01/2019   HCT 39.6 11/01/2019   PLT 252.0 11/01/2019   GLUCOSE 98 07/18/2020   CHOL 223 (H) 07/18/2020   TRIG 107.0 07/18/2020   HDL 63.90 07/18/2020   LDLCALC 138 (H) 07/18/2020   ALT 26 07/18/2020   AST 20 07/18/2020   NA 137 07/18/2020   K 4.0 07/18/2020   CL 101 07/18/2020   CREATININE 0.79 07/18/2020   BUN 12 07/18/2020   CO2 28 07/18/2020   TSH 0.89 07/18/2020   HGBA1C 5.9 07/18/2020    MM 3D SCREEN BREAST BILATERAL  Result Date: 08/25/2019 CLINICAL DATA:  Screening. EXAM: DIGITAL SCREENING BILATERAL MAMMOGRAM WITH TOMO AND CAD COMPARISON:  Previous exam(s). ACR Breast Density Category b: There are scattered areas of fibroglandular density. FINDINGS: There are no findings suspicious for malignancy. Images were processed with CAD. IMPRESSION: No mammographic evidence of malignancy. A result letter of this screening mammogram will be mailed directly to the patient. RECOMMENDATION: Screening mammogram in one year. (Code:SM-B-01Y) BI-RADS CATEGORY  1: Negative. Electronically Signed   By: Lillia Mountain M.D.   On: 08/25/2019 14:59       Assessment & Plan:   Problem List Items Addressed This Visit    Stress    Increased stress as outlined.  Discussed with her today.  Has  good support. On wellbutrin.  Does not feel needs any further intervention.  Follow.       Screening for breast cancer   Relevant Orders   MM 3D SCREEN BREAST BILATERAL   Hypertension - Primary    On hctz and amlodipine. Blood pressure on recheck improved.  Increased stress currently with her grandson.  Hold on making changes.  Follow pressures.  Follow metabolic panel.       Relevant Medications   amLODipine (NORVASC) 2.5 MG tablet   Hyperglycemia    Low carb diet and exercise.  Follow met b and a1c.       Relevant Orders   Hemoglobin A1c (Completed)   Hypercholesterolemia    On crestor.  Low cholesterol diet and exercise.  Follow lipid panel and liver function tests.        Relevant Medications   amLODipine (NORVASC) 2.5 MG tablet   Other Relevant Orders   Hepatic function panel (Completed)   Lipid panel (Completed)   Health care maintenance    Physical scheduled for today 07/18/20.  Colonoscopy 11/2017.  Recommended f/u colonoscopy in 11/2027.  Mammogram 08/25/19 - birads I.  Need to schedule f/u mammogram.       GERD (gastroesophageal reflux disease)    No upper symptoms reported.            Einar Pheasant, MD

## 2020-07-19 ENCOUNTER — Telehealth: Payer: Self-pay | Admitting: Internal Medicine

## 2020-07-19 NOTE — Telephone Encounter (Signed)
Patient was returning call for lab results 

## 2020-07-19 NOTE — Telephone Encounter (Signed)
Pt returned call for lab results.

## 2020-07-19 NOTE — Telephone Encounter (Signed)
See result note.  

## 2020-07-30 ENCOUNTER — Encounter: Payer: Self-pay | Admitting: Internal Medicine

## 2020-07-30 NOTE — Assessment & Plan Note (Signed)
Low carb diet and exercise.  Follow met b and a1c.  

## 2020-07-30 NOTE — Assessment & Plan Note (Signed)
No upper symptoms reported.   

## 2020-07-30 NOTE — Assessment & Plan Note (Signed)
Physical scheduled for today 07/18/20.  Colonoscopy 11/2017.  Recommended f/u colonoscopy in 11/2027.  Mammogram 08/25/19 - birads I.  Need to schedule f/u mammogram.

## 2020-07-30 NOTE — Assessment & Plan Note (Signed)
On hctz and amlodipine. Blood pressure on recheck improved.  Increased stress currently with her grandson.  Hold on making changes.  Follow pressures.  Follow metabolic panel.

## 2020-07-30 NOTE — Assessment & Plan Note (Signed)
On crestor.  Low cholesterol diet and exercise.  Follow lipid panel and liver function tests.   

## 2020-07-30 NOTE — Assessment & Plan Note (Signed)
Increased stress as outlined.  Discussed with her today.  Has good support. On wellbutrin.  Does not feel needs any further intervention.  Follow.

## 2020-08-07 ENCOUNTER — Other Ambulatory Visit: Payer: Self-pay | Admitting: Internal Medicine

## 2020-08-08 ENCOUNTER — Other Ambulatory Visit: Payer: Self-pay | Admitting: Internal Medicine

## 2020-09-05 ENCOUNTER — Encounter: Payer: 59 | Admitting: Internal Medicine

## 2020-09-20 ENCOUNTER — Encounter: Payer: Self-pay | Admitting: Internal Medicine

## 2020-10-05 ENCOUNTER — Ambulatory Visit (LOCAL_COMMUNITY_HEALTH_CENTER): Payer: 59 | Admitting: Physician Assistant

## 2020-10-05 ENCOUNTER — Other Ambulatory Visit: Payer: Self-pay

## 2020-10-05 ENCOUNTER — Encounter: Payer: Self-pay | Admitting: Physician Assistant

## 2020-10-05 VITALS — BP 130/81 | Ht 71.0 in | Wt 246.0 lb

## 2020-10-05 DIAGNOSIS — Z0000 Encounter for general adult medical examination without abnormal findings: Secondary | ICD-10-CM | POA: Diagnosis not present

## 2020-10-05 DIAGNOSIS — Z3009 Encounter for other general counseling and advice on contraception: Secondary | ICD-10-CM

## 2020-10-05 DIAGNOSIS — Z3049 Encounter for surveillance of other contraceptives: Secondary | ICD-10-CM

## 2020-10-05 NOTE — Progress Notes (Signed)
Family Planning Visit- Repeat Yearly Visit  Subjective:  Paula Wallace is a 54 y.o. G1P0001  being seen today for an well woman visit and to discuss family planning options.    She is currently using Nexplanon for pregnancy prevention. Patient reports she does not want a pregnancy in the next year. Patient  has Screening for breast cancer; Rosacea; Hypertension; Hypercholesterolemia; Routine general medical examination at a health care facility; Nonspecific abnormal electrocardiogram (ECG) (EKG); Back pain; Encounter for smoking cessation counseling; Hyperglycemia; Rash; Stress; Obesity (BMI 30-39.9); Thyroid fullness; Health care maintenance; Tobacco use; and GERD (gastroesophageal reflux disease) on their problem list.  Chief Complaint  Patient presents with  . Contraception    Nexplanon removal and reinsertion    Patient reports that she is here for Nexplanon removal /reinsertion.  States that she has had her Nexplanon for 3 years now.  Per chart review, had PE with PCP on 07/18/2020.    Patient denies any concerns today.    See flowsheet for other program required questions.   Body mass index is 34.31 kg/m. - Patient is eligible for diabetes screening based on BMI and age >30?  yes HA1C ordered? No, patient followed by PCP.  Patient reports 1  partner in last year. Desires STI screening?  No - pt declines.   Has patient been screened once for HCV in the past?  No  No results found for: HCVAB  Does the patient have current of drug use, have a partner with drug use, and/or has been incarcerated since last result? No  If yes-- Screen for HCV through [facility]   Does the patient meet criteria for HBV testing? No  Criteria:  -Household, sexual or needle sharing contact with HBV -History of drug use -HIV positive -Those with known Hep C   Health Maintenance Due  Topic Date Due  . Hepatitis C Screening  Never done  . HIV Screening  Never done  . TETANUS/TDAP   06/10/2016  . PAP SMEAR-Modifier  12/19/2019  . COVID-19 Vaccine (3 - Booster for Pfizer series) 05/21/2020  . MAMMOGRAM  08/23/2020    Review of Systems  All other systems reviewed and are negative.   The following portions of the patient's history were reviewed and updated as appropriate: allergies, current medications, past family history, past medical history, past social history, past surgical history and problem list. Problem list updated.  Objective:   Vitals:   10/05/20 1046 10/05/20 1058  BP: 140/84 130/81  Weight: 246 lb (111.6 kg)   Height: 5\' 11"  (1.803 m)     Physical Exam Vitals and nursing note reviewed.  Constitutional:      General: She is not in acute distress.    Appearance: Normal appearance.  HENT:     Head: Normocephalic and atraumatic.  Eyes:     Conjunctiva/sclera: Conjunctivae normal.  Pulmonary:     Effort: Pulmonary effort is normal.  Skin:    General: Skin is warm and dry.  Neurological:     Mental Status: She is alert and oriented to person, place, and time.  Psychiatric:        Mood and Affect: Mood normal.        Behavior: Behavior normal.        Thought Content: Thought content normal.        Judgment: Judgment normal.       Assessment and Plan:  Paula Wallace is a 54 y.o. female [place] presenting to the [facility]  Department for an yearly well woman exam/family planning visit  Contraception counseling: Reviewed all forms of birth control options in the tiered based approach. available including abstinence; over the counter/barrier methods; hormonal contraceptive medication including pill, patch, ring, injection,contraceptive implant, ECP; hormonal and nonhormonal IUDs; permanent sterilization options including vasectomy and the various tubal sterilization modalities. Risks, benefits, and typical effectiveness rates were reviewed.  Questions were answered.  Written information was also given to the patient to review.   Patient desires to continue with her current Nexplanon, this was prescribed for patient. She will follow up in  1 year and prn for surveillance.  She was told to call with any further questions, or with any concerns about this method of contraception.  Emphasized use of condoms 100% of the time for STI prevention.  Patient was not a candidate for ECP today.    1. Encounter for counseling regarding contraception Counseled patient that due to her age, we would recommend that she have evaluation by PCP to see whether she has gone through menopause. Enc patient to discuss with PCP doing this blood work at her next visit. Counseled patient that her Nexplanon will protect her for another year. Enc to use condoms with all sex for STD protection.  2. Preventative health care Reviewed with patient healthy habits to maintain general health. Enc MVI 1 po daily. Enc to establish with/ follow up with PCP for primary care concerns, age appropriate screenings and illness.  3. Encounter for surveillance of other contraceptives Patient opts to continue with current Nexplanon for now and follow up with PCP at her next appointment. RTC prn or 1 year for Nexplanon removal.     No follow-ups on file.  Future Appointments  Date Time Provider [place]  11/17/2020  9:30 AM Einar Pheasant, MD [facility] [place], [place]

## 2020-10-05 NOTE — Progress Notes (Signed)
Patient has last physical with [facility] on 06/17/2020. Patient here for nexplanon removal and reinsertion.   Deri Fuelling, RN

## 2020-11-17 ENCOUNTER — Other Ambulatory Visit: Payer: Self-pay | Admitting: Internal Medicine

## 2020-11-17 ENCOUNTER — Encounter: Payer: Self-pay | Admitting: Internal Medicine

## 2020-11-17 ENCOUNTER — Telehealth (INDEPENDENT_AMBULATORY_CARE_PROVIDER_SITE_OTHER): Payer: 59 | Admitting: Internal Medicine

## 2020-11-17 DIAGNOSIS — F439 Reaction to severe stress, unspecified: Secondary | ICD-10-CM | POA: Diagnosis not present

## 2020-11-17 DIAGNOSIS — R739 Hyperglycemia, unspecified: Secondary | ICD-10-CM

## 2020-11-17 DIAGNOSIS — E7800 Pure hypercholesterolemia, unspecified: Secondary | ICD-10-CM | POA: Diagnosis not present

## 2020-11-17 DIAGNOSIS — I10 Essential (primary) hypertension: Secondary | ICD-10-CM | POA: Diagnosis not present

## 2020-11-17 NOTE — Progress Notes (Addendum)
[name], female   DOB: 1966-04-14, 55 y.o.   MRN: [id number]   Virtual Visit via video Note  This visit type was conducted due to national recommendations for restrictions regarding the COVID-19 pandemic (e.g. social distancing).  This format is felt to be most appropriate for this patient at this time.  All issues noted in this document were discussed and addressed.  No physical exam was performed (except for noted visual exam findings with Video Visits).   I connected with Paula Wallace today by a video enabled telemedicine application or telephone and verified that I am speaking with the correct person using two identifiers. Location patient: home Location provider: work Persons participating in the virtual visit: patient, provider  The limitations, risks, security and privacy concerns of performing an evaluation and management service by video and the availability of in person appointments have been discussed.  It has also been discussed with the patient that there may be a patient responsible charge related to this service. The patient expressed understanding and agreed to proceed.  Interactive audio and video telecommunications were attempted between this provider and patient, however failed, due to technical difficulties.  We continued and completed visit with audio only. Approximately half of the visit was conducted via video and then converted to telephone visit.   Reason for visit: scheduled follow up.   HPI: Follow up regarding her blood pressure, blood sugar and cholesterol.  Also increased stress.  Her brother recently passed.  Increased stress related to this.  Has good support.  Discussed.  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  On wellbutrin.  Takes hydroxyzine to help with sleep.  Getting back into the gym next week.  No chest pain or sob reported.  No acid reflux reported.  No nausea, vomiting or diarrhea.     ROS: See pertinent  positives and negatives per HPI.  Past Medical History:  Diagnosis Date  . Hyperlipidemia   . Hypertension     Past Surgical History:  Procedure Laterality Date  . VAGINAL DELIVERY  1989   episiotomy, Dr. Arsenio Wallace    Family History  Problem Relation Age of Onset  . Diabetes Mother   . Dementia Mother   . Stroke Father 65  . Hypertension Sister   . Thyroid disease Sister   . Diabetes Sister   . Hypertension Brother   . Diabetes Brother   . Hypertension Sister   . Hypertension Brother   . Hypertension Brother   . Hypertension Brother   . Hypertension Sister   . Breast cancer Neg Hx   . Lupus Neg Hx     SOCIAL HX: reviewed.    Current Outpatient Medications:  .  hydrochlorothiazide (HYDRODIURIL) 25 MG tablet, TAKE 1 TABLET BY MOUTH DAILY, Disp: 90 tablet, Rfl: 2 .  hydrOXYzine (ATARAX/VISTARIL) 25 MG tablet, Take 1 tablet (25 mg total) by mouth at bedtime as needed., Disp: 30 tablet, Rfl: 0 .  Multiple Vitamin (MULTIVITAMIN WITH MINERALS) TABS tablet, Take 1 tablet by mouth daily., Disp: , Rfl:  .  triamcinolone (KENALOG) 0.025 % cream, Apply 1 application topically 2 (two) times daily., Disp: 80 g, Rfl: 0 .  Turmeric 500 MG CAPS, Take 500 mg by mouth once., Disp: , Rfl:  .  vitamin B-12 (CYANOCOBALAMIN) 500 MCG tablet, Take 500 mcg by mouth daily., Disp: , Rfl:  .  amLODipine (NORVASC) 2.5 MG tablet, Take 1 tablet (2.5 mg total) by mouth daily., Disp: 90 tablet,  Rfl: 1 .  buPROPion (WELLBUTRIN XL) 150 MG 24 hr tablet, Take 1 tablet (150 mg total) by mouth 2 (two) times daily., Disp: 180 tablet, Rfl: 1  EXAM:  GENERAL: alert, oriented, appears well and in no acute distress  HEENT: atraumatic, conjunttiva clear, no obvious abnormalities on inspection of external nose and ears  NECK: normal movements of the head and neck  LUNGS: on inspection no signs of respiratory distress, breathing rate appears normal, no obvious gross SOB, gasping or wheezing  CV: no obvious  cyanosis  PSYCH/NEURO: pleasant and cooperative, no obvious depression or anxiety, speech and thought processing grossly intact  ASSESSMENT AND PLAN:  Discussed the following assessment and plan:  Problem List Items Addressed This Visit    Hypercholesterolemia    Off crestor.  Low cholesterol diet and exercise.  Follow lipid panel.        Relevant Medications   amLODipine (NORVASC) 2.5 MG tablet   Other Relevant Orders   Hepatic function panel   Lipid panel   Hyperglycemia    Low carb diet and exercise.  Follow met b and a1c.       Relevant Orders   Hemoglobin A1c   Hypertension    On hctz and amlodipine.  Pressures have been well controlled previously.  Follow metabolic panel.       Relevant Medications   amLODipine (NORVASC) 2.5 MG tablet   Other Relevant Orders   CBC with Differential/Platelet   Basic metabolic panel   Stress    Increased stress as outlined.  Discussed.  Does not feel she needs any further intervention.  On wellbutrin.  Has good support.  Follow.           I discussed the assessment and treatment plan with the patient. The patient was provided an opportunity to ask questions and all were answered. The patient agreed with the plan and demonstrated an understanding of the instructions.   The patient was advised to call back or seek an in-person evaluation if the symptoms worsen or if the condition fails to improve as anticipated.  I provided 22 minutes of non-face-to-face time during this encounter.   Einar Pheasant, MD

## 2020-11-18 ENCOUNTER — Encounter: Payer: Self-pay | Admitting: Internal Medicine

## 2020-11-18 MED ORDER — BUPROPION HCL ER (XL) 150 MG PO TB24
150.0000 mg | ORAL_TABLET | Freq: Two times a day (BID) | ORAL | 1 refills | Status: DC
Start: 2020-11-18 — End: 2021-04-06

## 2020-11-18 MED ORDER — AMLODIPINE BESYLATE 2.5 MG PO TABS
2.5000 mg | ORAL_TABLET | Freq: Every day | ORAL | 1 refills | Status: DC
Start: 2020-11-18 — End: 2021-04-06

## 2020-11-18 NOTE — Assessment & Plan Note (Signed)
Off crestor.  Low cholesterol diet and exercise.  Follow lipid panel.  

## 2020-11-18 NOTE — Assessment & Plan Note (Signed)
Low carb diet and exercise.  Follow met b and a1c.  

## 2020-11-18 NOTE — Addendum Note (Signed)
Addended by: Alisa Graff on: 11/18/2020 07:43 PM   Modules accepted: Orders

## 2020-11-18 NOTE — Assessment & Plan Note (Signed)
On hctz and amlodipine.  Pressures have been well controlled previously.  Follow metabolic panel.

## 2020-11-18 NOTE — Assessment & Plan Note (Signed)
Increased stress as outlined.  Discussed.  Does not feel she needs any further intervention.  On wellbutrin.  Has good support.  Follow.

## 2021-01-23 ENCOUNTER — Other Ambulatory Visit: Payer: Self-pay

## 2021-01-25 ENCOUNTER — Other Ambulatory Visit: Payer: Self-pay | Admitting: Internal Medicine

## 2021-01-25 ENCOUNTER — Other Ambulatory Visit: Payer: Self-pay

## 2021-01-25 MED FILL — Bupropion HCl Tab ER 24HR 150 MG: ORAL | 30 days supply | Qty: 60 | Fill #0 | Status: AC

## 2021-01-25 NOTE — Telephone Encounter (Signed)
Patient called in for refill amLODipine (NORVASC) 2.5 MG tablet

## 2021-01-26 ENCOUNTER — Other Ambulatory Visit: Payer: Self-pay

## 2021-01-27 ENCOUNTER — Other Ambulatory Visit: Payer: Self-pay

## 2021-01-29 ENCOUNTER — Other Ambulatory Visit: Payer: Self-pay

## 2021-02-06 ENCOUNTER — Other Ambulatory Visit: Payer: Self-pay

## 2021-02-06 ENCOUNTER — Other Ambulatory Visit (INDEPENDENT_AMBULATORY_CARE_PROVIDER_SITE_OTHER): Payer: 59

## 2021-02-06 DIAGNOSIS — E7800 Pure hypercholesterolemia, unspecified: Secondary | ICD-10-CM | POA: Diagnosis not present

## 2021-02-06 DIAGNOSIS — H5213 Myopia, bilateral: Secondary | ICD-10-CM | POA: Diagnosis not present

## 2021-02-06 DIAGNOSIS — R739 Hyperglycemia, unspecified: Secondary | ICD-10-CM

## 2021-02-06 DIAGNOSIS — I10 Essential (primary) hypertension: Secondary | ICD-10-CM

## 2021-02-06 LAB — CBC WITH DIFFERENTIAL/PLATELET
Basophils Absolute: 0 10*3/uL (ref 0.0–0.1)
Basophils Relative: 0.3 % (ref 0.0–3.0)
Eosinophils Absolute: 0.3 10*3/uL (ref 0.0–0.7)
Eosinophils Relative: 4.3 % (ref 0.0–5.0)
HCT: 42.2 % (ref 36.0–46.0)
Hemoglobin: 14.5 g/dL (ref 12.0–15.0)
Lymphocytes Relative: 30.8 % (ref 12.0–46.0)
Lymphs Abs: 1.9 10*3/uL (ref 0.7–4.0)
MCHC: 34.3 g/dL (ref 30.0–36.0)
MCV: 91.6 fl (ref 78.0–100.0)
Monocytes Absolute: 0.4 10*3/uL (ref 0.1–1.0)
Monocytes Relative: 7.3 % (ref 3.0–12.0)
Neutro Abs: 3.5 10*3/uL (ref 1.4–7.7)
Neutrophils Relative %: 57.3 % (ref 43.0–77.0)
Platelets: 237 10*3/uL (ref 150.0–400.0)
RBC: 4.6 Mil/uL (ref 3.87–5.11)
RDW: 13.7 % (ref 11.5–15.5)
WBC: 6.1 10*3/uL (ref 4.0–10.5)

## 2021-02-06 LAB — HEPATIC FUNCTION PANEL
ALT: 21 U/L (ref 0–35)
AST: 16 U/L (ref 0–37)
Albumin: 4.2 g/dL (ref 3.5–5.2)
Alkaline Phosphatase: 69 U/L (ref 39–117)
Bilirubin, Direct: 0.1 mg/dL (ref 0.0–0.3)
Total Bilirubin: 0.6 mg/dL (ref 0.2–1.2)
Total Protein: 6.9 g/dL (ref 6.0–8.3)

## 2021-02-06 LAB — BASIC METABOLIC PANEL
BUN: 12 mg/dL (ref 6–23)
CO2: 27 mEq/L (ref 19–32)
Calcium: 10.2 mg/dL (ref 8.4–10.5)
Chloride: 102 mEq/L (ref 96–112)
Creatinine, Ser: 0.87 mg/dL (ref 0.40–1.20)
GFR: 75.88 mL/min (ref >60.00)
Glucose, Bld: 98 mg/dL (ref 70–99)
Potassium: 4 mEq/L (ref 3.5–5.1)
Sodium: 138 mEq/L (ref 135–145)

## 2021-02-06 LAB — LIPID PANEL
Cholesterol: 221 mg/dL — ABNORMAL HIGH (ref 0–200)
HDL: 60.4 mg/dL (ref >39.00)
LDL Cholesterol: 145 mg/dL — ABNORMAL HIGH (ref 0–99)
NonHDL: 160.34
Total CHOL/HDL Ratio: 4
Triglycerides: 76 mg/dL (ref 0.0–149.0)
VLDL: 15.2 mg/dL (ref 0.0–40.0)

## 2021-02-06 LAB — HEMOGLOBIN A1C: Hgb A1c MFr Bld: 6 % (ref 4.6–6.5)

## 2021-02-12 ENCOUNTER — Other Ambulatory Visit: Payer: 59

## 2021-02-22 ENCOUNTER — Other Ambulatory Visit: Payer: Self-pay

## 2021-02-22 MED FILL — Hydrochlorothiazide Tab 25 MG: ORAL | 90 days supply | Qty: 90 | Fill #0 | Status: AC

## 2021-04-06 ENCOUNTER — Other Ambulatory Visit: Payer: Self-pay

## 2021-04-06 ENCOUNTER — Ambulatory Visit: Payer: 59 | Admitting: Internal Medicine

## 2021-04-06 VITALS — BP 132/78 | HR 74 | Temp 96.6°F | Resp 16 | Ht 71.0 in | Wt 240.6 lb

## 2021-04-06 DIAGNOSIS — Z1231 Encounter for screening mammogram for malignant neoplasm of breast: Secondary | ICD-10-CM | POA: Diagnosis not present

## 2021-04-06 DIAGNOSIS — E0789 Other specified disorders of thyroid: Secondary | ICD-10-CM | POA: Diagnosis not present

## 2021-04-06 DIAGNOSIS — I10 Essential (primary) hypertension: Secondary | ICD-10-CM

## 2021-04-06 DIAGNOSIS — F439 Reaction to severe stress, unspecified: Secondary | ICD-10-CM | POA: Diagnosis not present

## 2021-04-06 DIAGNOSIS — E7800 Pure hypercholesterolemia, unspecified: Secondary | ICD-10-CM

## 2021-04-06 DIAGNOSIS — Z720 Tobacco use: Secondary | ICD-10-CM

## 2021-04-06 DIAGNOSIS — R739 Hyperglycemia, unspecified: Secondary | ICD-10-CM | POA: Diagnosis not present

## 2021-04-06 MED ORDER — BUPROPION HCL ER (XL) 150 MG PO TB24
150.0000 mg | ORAL_TABLET | Freq: Two times a day (BID) | ORAL | 1 refills | Status: DC
  Filled 2021-04-06: qty 180, 90d supply, fill #0
  Filled 2021-07-05: qty 180, 90d supply, fill #1

## 2021-04-06 MED ORDER — BUPROPION HCL ER (XL) 150 MG PO TB24: 150.0000 mg | ORAL_TABLET | Freq: Two times a day (BID) | ORAL | 1 refills | Status: DC

## 2021-04-06 MED ORDER — AMLODIPINE BESYLATE 2.5 MG PO TABS: 2.5000 mg | ORAL_TABLET | Freq: Every day | ORAL | 1 refills | Status: DC

## 2021-04-06 MED ORDER — AMLODIPINE BESYLATE 2.5 MG PO TABS
2.5000 mg | ORAL_TABLET | Freq: Every day | ORAL | 1 refills | Status: DC
  Filled 2021-04-06: qty 90, 90d supply, fill #0
  Filled 2021-07-05: qty 90, 90d supply, fill #1

## 2021-04-06 NOTE — Progress Notes (Signed)
Patient ID: Brittie Whisnant, female   DOB: 31-Jan-1966, 55 y.o.   MRN: [id number]   Subjective:    Patient ID: Evangelia Whitaker, female    DOB: 17-Mar-1966, 55 y.o.   MRN: [id number]  HPI This visit occurred during the SARS-CoV-2 public health emergency.  Safety protocols were in place, including screening questions prior to the visit, additional usage of staff PPE, and extensive cleaning of exam room while observing appropriate contact time as indicated for disinfecting solutions.   Patient here for a scheduled follow up.  Here to follow up regarding her cholesterol and her blood pressure.  Increased stress.  Has good support.  Off wellbutrin - was unable to get refilled.  Also has been off amlodipine.  She feels she is handling stress relatively well.  Does feel needs to be back on wellbutrin.  Feels - helped to level things out.  Has good family support.  No chest pain or sob reported.  Working out three days per week.  Has a trainer one day per week. No acid reflux reported.  No abdominal pain or bowel change reported.  Taking a women's probiotic.  Discussed smoking and the need to quit.  Discussed screening CT. Has smoked since she was a teenager.    Past Medical History:  Diagnosis Date   Hyperlipidemia    Hypertension    Past Surgical History:  Procedure Laterality Date   VAGINAL DELIVERY  1989   episiotomy, Dr. Arsenio Loader   Family History  Problem Relation Age of Onset   Diabetes Mother    Dementia Mother    Stroke Father 9   Hypertension Sister    Thyroid disease Sister    Diabetes Sister    Hypertension Brother    Diabetes Brother    Hypertension Sister    Hypertension Brother    Hypertension Brother    Hypertension Brother    Hypertension Sister    Breast cancer Neg Hx    Lupus Neg Hx    Social History   Socioeconomic History   Marital status: Single    Spouse name: Not on file   Number of children: Not on file   Years of education: Not on file   Highest  education level: Not on file  Occupational History   Not on file  Tobacco Use   Smoking status: Some Days    Packs/day: 0.50    Years: 15.00    Pack years: 7.50    Types: Cigarettes   Smokeless tobacco: Never  Vaping Use   Vaping Use: Never used  Substance and Sexual Activity   Alcohol use: Yes    Alcohol/week: 0.0 standard drinks    Comment: 2-3 times a week per patient   Drug use: No   Sexual activity: Yes    Birth control/protection: Implant  Other Topics Concern   Not on file  Social History Narrative   Lives in [place]. 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      Diet: regular diet, limits fried food   Exercise: walks goal 63mn daily   Social Determinants of Health   Financial Resource Strain: Not on file  Food Insecurity: Not on file  Transportation Needs: Not on file  Physical Activity: Not on file  Stress: Not on file  Social Connections: Not on file    Outpatient Encounter Medications as of 04/06/2021  Medication Sig   amLODipine (NORVASC) 2.5 MG tablet Take 1 tablet (2.5 mg total) by mouth daily.  buPROPion (WELLBUTRIN XL) 150 MG 24 hr tablet Take 1 tablet (150 mg total) by mouth 2 (two) times daily.   hydrochlorothiazide (HYDRODIURIL) 25 MG tablet TAKE 1 TABLET BY MOUTH DAILY   hydrOXYzine (ATARAX/VISTARIL) 25 MG tablet Take 1 tablet (25 mg total) by mouth at bedtime as needed.   Multiple Vitamin (MULTIVITAMIN WITH MINERALS) TABS tablet Take 1 tablet by mouth daily.   triamcinolone (KENALOG) 0.025 % cream Apply 1 application topically 2 (two) times daily.   Turmeric 500 MG CAPS Take 500 mg by mouth once.   vitamin B-12 (CYANOCOBALAMIN) 500 MCG tablet Take 500 mcg by mouth daily.   [DISCONTINUED] amLODipine (NORVASC) 2.5 MG tablet Take 1 tablet (2.5 mg total) by mouth daily.   [DISCONTINUED] amLODipine (NORVASC) 2.5 MG tablet Take 1 tablet (2.5 mg total) by mouth daily.   [DISCONTINUED] buPROPion (WELLBUTRIN XL) 150 MG 24 hr tablet  Take 1 tablet (150 mg total) by mouth 2 (two) times daily.   [DISCONTINUED] buPROPion (WELLBUTRIN XL) 150 MG 24 hr tablet TAKE 1 TABLET BY MOUTH TWICE DAILY   [DISCONTINUED] buPROPion (WELLBUTRIN XL) 150 MG 24 hr tablet Take 1 tablet (150 mg total) by mouth 2 (two) times daily.   No facility-administered encounter medications on file as of 04/06/2021.     Review of Systems  Constitutional:  Negative for appetite change and unexpected weight change.  HENT:  Negative for congestion and sinus pressure.   Respiratory:  Negative for cough, chest tightness and shortness of breath.   Cardiovascular:  Negative for chest pain and palpitations.  Gastrointestinal:  Negative for abdominal pain, diarrhea, nausea and vomiting.  Genitourinary:  Negative for difficulty urinating and dysuria.  Musculoskeletal:  Negative for joint swelling and myalgias.  Skin:  Negative for color change and rash.  Neurological:  Negative for dizziness, light-headedness and headaches.  Psychiatric/Behavioral:  Negative for agitation and dysphoric mood.       Objective:    Physical Exam Vitals reviewed.  Constitutional:      General: She is not in acute distress.    Appearance: Normal appearance.  HENT:     Head: Normocephalic and atraumatic.     Right Ear: External ear normal.     Left Ear: External ear normal.  Eyes:     General: No scleral icterus.       Right eye: No discharge.        Left eye: No discharge.     Conjunctiva/sclera: Conjunctivae normal.  Neck:     Thyroid: No thyromegaly.  Cardiovascular:     Rate and Rhythm: Normal rate and regular rhythm.  Pulmonary:     Effort: No respiratory distress.     Breath sounds: Normal breath sounds. No wheezing.  Abdominal:     General: Bowel sounds are normal.     Palpations: Abdomen is soft.     Tenderness: There is no abdominal tenderness.  Musculoskeletal:        General: No swelling or tenderness.     Cervical back: Neck supple. No tenderness.   Lymphadenopathy:     Cervical: No cervical adenopathy.  Skin:    Findings: No erythema or rash.  Neurological:     Mental Status: She is alert.  Psychiatric:        Mood and Affect: Mood normal.        Behavior: Behavior normal.    BP 132/78   Pulse 74   Temp (!) 96.6 F (35.9 C) (Temporal)   Resp 16  Ht 5' 11"  (1.803 m)   Wt 240 lb 9.6 oz (109.1 kg)   SpO2 99%   BMI 33.56 kg/m  Wt Readings from Last 3 Encounters:  04/06/21 240 lb 9.6 oz (109.1 kg)  11/17/20 242 lb (109.8 kg)  10/05/20 246 lb (111.6 kg)     Lab Results  Component Value Date   WBC 6.1 02/06/2021   HGB 14.5 02/06/2021   HCT 42.2 02/06/2021   PLT 237.0 02/06/2021   GLUCOSE 98 02/06/2021   CHOL 221 (H) 02/06/2021   TRIG 76.0 02/06/2021   HDL 60.40 02/06/2021   LDLCALC 145 (H) 02/06/2021   ALT 21 02/06/2021   AST 16 02/06/2021   NA 138 02/06/2021   K 4.0 02/06/2021   CL 102 02/06/2021   CREATININE 0.87 02/06/2021   BUN 12 02/06/2021   CO2 27 02/06/2021   TSH 0.89 07/18/2020   HGBA1C 6.0 02/06/2021    MM 3D SCREEN BREAST BILATERAL  Result Date: 08/25/2019 CLINICAL DATA:  Screening. EXAM: DIGITAL SCREENING BILATERAL MAMMOGRAM WITH TOMO AND CAD COMPARISON:  Previous exam(s). ACR Breast Density Category b: There are scattered areas of fibroglandular density. FINDINGS: There are no findings suspicious for malignancy. Images were processed with CAD. IMPRESSION: No mammographic evidence of malignancy. A result letter of this screening mammogram will be mailed directly to the patient. RECOMMENDATION: Screening mammogram in one year. (Code:SM-B-01Y) BI-RADS CATEGORY  1: Negative. Electronically Signed   By: Lillia Mountain M.D.   On: 08/25/2019 14:59       Assessment & Plan:   Problem List Items Addressed This Visit     Hypercholesterolemia    Off crestor.  Low cholesterol diet and exercise.  Follow lipid panel.         Relevant Medications   amLODipine (NORVASC) 2.5 MG tablet   Other Relevant  Orders   Hepatic function panel   Lipid panel   Hyperglycemia    Low carb diet and exercise.  Follow met b and a1c.        Relevant Orders   Hemoglobin A1c   Hypertension - Primary    On hctz and amlodipine.  Pressures have been well controlled.  Follow metabolic panel.        Relevant Medications   amLODipine (NORVASC) 2.5 MG tablet   Other Relevant Orders   Basic metabolic panel   Screening for breast cancer    Overdue mammogram.  Scheduled today.        Stress    Overall appears to be handling things relatively well.  Restart wellbutrin.  Has good family support.  Follow.        Thyroid fullness    Was previously evaluated by Dr Eddie Dibbles.  Was scheduled for a f/u thyroid ultrasound.  Cancelled due to financial issues.  Discussed referral to endocrinology for reevaluation.        Relevant Orders   T4, free   TSH   Tobacco use    Discussed the need to quit.  Restart wellbutrin.  Discussed screening CT chest.  She is agreeable.  Follow.          Einar Pheasant, MD

## 2021-04-08 ENCOUNTER — Encounter: Payer: Self-pay | Admitting: Internal Medicine

## 2021-04-08 NOTE — Assessment & Plan Note (Signed)
Discussed the need to quit.  Restart wellbutrin.  Discussed screening CT chest.  She is agreeable.  Follow.

## 2021-04-08 NOTE — Assessment & Plan Note (Signed)
On hctz and amlodipine.  Pressures have been well controlled.  Follow metabolic panel.

## 2021-04-08 NOTE — Assessment & Plan Note (Signed)
Overall appears to be handling things relatively well.  Restart wellbutrin.  Has good family support.  Follow.

## 2021-04-08 NOTE — Assessment & Plan Note (Signed)
Off crestor.  Low cholesterol diet and exercise.  Follow lipid panel.  

## 2021-04-08 NOTE — Assessment & Plan Note (Signed)
Overdue mammogram.  Scheduled today.

## 2021-04-08 NOTE — Assessment & Plan Note (Signed)
Was previously evaluated by Dr Eddie Dibbles.  Was scheduled for a f/u thyroid ultrasound.  Cancelled due to financial issues.  Discussed referral to endocrinology for reevaluation.

## 2021-04-08 NOTE — Assessment & Plan Note (Signed)
Low carb diet and exercise.  Follow met b and a1c.  

## 2021-04-17 ENCOUNTER — Ambulatory Visit
Admission: RE | Admit: 2021-04-17 | Discharge: 2021-04-17 | Disposition: A | Discharge status: Home / Self Care | Payer: 59 | Source: Ambulatory Visit | Attending: Internal Medicine | Admitting: Internal Medicine

## 2021-04-17 ENCOUNTER — Other Ambulatory Visit: Payer: Self-pay

## 2021-04-17 DIAGNOSIS — Z1231 Encounter for screening mammogram for malignant neoplasm of breast: Secondary | ICD-10-CM | POA: Insufficient documentation

## 2021-05-02 ENCOUNTER — Encounter: Payer: Self-pay | Admitting: Internal Medicine

## 2021-05-02 NOTE — Telephone Encounter (Signed)
See patient chart for Documentation that is referred in message.

## 2021-05-02 NOTE — Telephone Encounter (Signed)
Please see my chart message.  She is making reference to her sister Rocky Morel.  (See Gail's chart).  I will send you a message.  Can you call [facility]?

## 2021-05-24 ENCOUNTER — Other Ambulatory Visit: Payer: Self-pay

## 2021-05-24 ENCOUNTER — Other Ambulatory Visit: Payer: Self-pay | Admitting: Internal Medicine

## 2021-05-24 MED ORDER — HYDROCHLOROTHIAZIDE 25 MG PO TABS
ORAL_TABLET | Freq: Every day | ORAL | 2 refills | Status: DC
  Filled 2021-05-24: qty 90, 90d supply, fill #0
  Filled 2021-09-04: qty 90, 90d supply, fill #1
  Filled 2021-12-11: qty 90, 90d supply, fill #2

## 2021-05-29 ENCOUNTER — Telehealth: Payer: Self-pay | Admitting: Internal Medicine

## 2021-05-29 NOTE — Telephone Encounter (Signed)
Patient wants a referral for allergy testing. She will call her insurance carrier to see who is in network.

## 2021-05-30 NOTE — Telephone Encounter (Signed)
Patient is going to get name of who she wants to see and then call back to let me know when to place referral .

## 2021-05-31 ENCOUNTER — Telehealth: Payer: Self-pay

## 2021-05-31 DIAGNOSIS — R21 Rash and other nonspecific skin eruption: Secondary | ICD-10-CM

## 2021-05-31 NOTE — Telephone Encounter (Signed)
Ok for referral?

## 2021-05-31 NOTE — Telephone Encounter (Signed)
Referral placed.

## 2021-05-31 NOTE — Telephone Encounter (Signed)
Patient called back to let me know where she is wanting to go for allergy testing.  Parks Ranger, MD [facility] [address] [place], [place] [id number]  Patient has an appt with them on 8/17  Reason for referral: Rash on face.   Ok to place referral for her? I do not mind placing referral- just need to have the ok from you since Dr Nicki Reaper is out of office until 8/16.

## 2021-06-04 ENCOUNTER — Other Ambulatory Visit (INDEPENDENT_AMBULATORY_CARE_PROVIDER_SITE_OTHER): Payer: 59

## 2021-06-04 ENCOUNTER — Other Ambulatory Visit: Payer: Self-pay

## 2021-06-04 DIAGNOSIS — R739 Hyperglycemia, unspecified: Secondary | ICD-10-CM

## 2021-06-04 DIAGNOSIS — E7800 Pure hypercholesterolemia, unspecified: Secondary | ICD-10-CM | POA: Diagnosis not present

## 2021-06-04 DIAGNOSIS — E0789 Other specified disorders of thyroid: Secondary | ICD-10-CM | POA: Diagnosis not present

## 2021-06-04 DIAGNOSIS — I10 Essential (primary) hypertension: Secondary | ICD-10-CM

## 2021-06-04 LAB — BASIC METABOLIC PANEL
BUN: 17 mg/dL (ref 6–23)
CO2: 27 mEq/L (ref 19–32)
Calcium: 9.8 mg/dL (ref 8.4–10.5)
Chloride: 103 mEq/L (ref 96–112)
Creatinine, Ser: 0.85 mg/dL (ref 0.40–1.20)
GFR: 77.85 mL/min (ref >60.00)
Glucose, Bld: 111 mg/dL — ABNORMAL HIGH (ref 70–99)
Potassium: 4 mEq/L (ref 3.5–5.1)
Sodium: 142 mEq/L (ref 135–145)

## 2021-06-04 LAB — T4, FREE: Free T4: 0.86 ng/dL (ref 0.60–1.60)

## 2021-06-04 LAB — LIPID PANEL
Cholesterol: 235 mg/dL — ABNORMAL HIGH (ref 0–200)
HDL: 75.3 mg/dL (ref >39.00)
LDL Cholesterol: 140 mg/dL — ABNORMAL HIGH (ref 0–99)
NonHDL: 159.42
Total CHOL/HDL Ratio: 3
Triglycerides: 96 mg/dL (ref 0.0–149.0)
VLDL: 19.2 mg/dL (ref 0.0–40.0)

## 2021-06-04 LAB — HEPATIC FUNCTION PANEL
ALT: 30 U/L (ref 0–35)
AST: 25 U/L (ref 0–37)
Albumin: 4.4 g/dL (ref 3.5–5.2)
Alkaline Phosphatase: 72 U/L (ref 39–117)
Bilirubin, Direct: 0.1 mg/dL (ref 0.0–0.3)
Total Bilirubin: 0.4 mg/dL (ref 0.2–1.2)
Total Protein: 6.8 g/dL (ref 6.0–8.3)

## 2021-06-04 LAB — TSH: TSH: 1.35 u[IU]/mL (ref 0.35–5.50)

## 2021-06-04 LAB — HEMOGLOBIN A1C: Hgb A1c MFr Bld: 6 % (ref 4.6–6.5)

## 2021-06-05 NOTE — Progress Notes (Signed)
New Patient Note  RE: Paula Wallace MRN: [id number] DOB: 06/16/1966 Date of Office Visit: 06/06/2021  Consult requested by: Einar Pheasant, MD Primary care provider: Einar Pheasant, MD  Chief Complaint: Establish Care and Rash (Ongoing for years, not sure what trigger it, mostly on the face and neck)  History of Present Illness: I had the pleasure of seeing Paula [facility] for initial evaluation at the [facility] on 06/07/2021. She is a 55 y.o. female, who is referred here by Einar Pheasant, MD for the evaluation of rash.  Rash started about 5-10 years ago. Mainly occurs on her face and neck. Describes them as itchy, tiny bumps. Individual rashes lasts about a few weeks. Associated symptoms include: none. Suspected triggers are unknown but worse with wearing a mask and getting overheated. Denies any fevers, chills, changes in medications, foods, personal care products or recent infections. She has tried the following therapies: triamcinolone 0.025% cream which used to help but not anymore.  Using Vaseline ointments, wears make up, gets nails done every 2 weeks and wears some perfume.   Systemic steroids once. Currently on hydroxyzine '50mg'$  at night otherwise she can't fall asleep due to the itching.  Previous work up includes: patient saw dermatology in the past - no prior patch testing.  Previous history of rash/hives: denies. Patient is up to date with the following cancer screening tests: physical exam, pap smears, mammogram, colonoscopy.  05/27/2019 dermatology visit: "Suspect ACD of the face with concomitant periorificial dermatitis in the setting of systemic and topical steroid use, no evidence of cutaneous lupus on biopsy and patient without any other signs or symptoms of autoimmune disease, previously responsive to topical steroids  - advised to stop using scent diffuser in her home as this might be contributing to her rash - Start triamcinolone  (KENALOG) 0.1 % ointment; Apply topically Two (2) times a day. To the rash until clear then stop for 1-2 weeks Dispense: 60 g; Refill: 1 - For suspected periorificial dermatitis component start doxycycline (MONODOX) 100 MG capsule; Take 1 capsule (100 mg total) by mouth Two (2) times a day. Dispense: 60 capsule; Refill: 0 - Will refer for patch testing. "  Assessment and Plan: Paula [facility] is a 55 y.o. female with: Allergic contact dermatitis Pruritic rash on the face and neck for the past 5 to 10 years.  No specific triggers noted.  Use triamcinolone 0.025% cream with initial benefit but no longer working.  Saw dermatology in the past and recommended patch testing which has not been done.  Takes hydroxyzine 50 mg at night to help with itching.  Patient wears make-up and perfume, gets nails done and is a smoker.  Based on distribution she most likely has contact dermatitis. See below for proper skin care - avoid fragrances and dyes. No perfumes, make up with fragrances. The smoking probably is also irritating her skin - recommend cessation.  Use desonide 0.05% ointment twice a day as needed for mild rash flares - okay to use on the face, neck, groin area. Do not use more than 2 weeks at a time. Use Eucrisa (crisaborole) 2% ointment twice a day on mild rash flares on the face and body. This is a non-steroid ointment. Sample given. If this works well for you, let us know and will send in a prescription for this. Recommend patch testing.  Unable to skin test today due to recent antihistamine intake. Get bloodwork to rule out other etiologies.  Return for Patch testing.  Meds ordered this encounter  Medications   desonide (DESOWEN) 0.05 % ointment    Sig: Apply 1 application topically 2 (two) times daily as needed (rash). Do not use more than 2 weeks in a row.    Dispense:  60 g    Refill:  2    Lab Orders         Allergens w/Total IgE Area 2         ANA w/Reflex         Tryptase          Sedimentation rate         C-reactive protein         Alpha-Gal Panel      Other allergy screening: Asthma: no Rhino conjunctivitis: no Food allergy: no Medication allergy: yes Hymenoptera allergy: no Urticaria: no History of recurrent infections suggestive of immunodeficency: no  Diagnostics: Skin Testing: Deferred due to recent antihistamines use.   Past Medical History: Patient Active Problem List   Diagnosis Date Noted   Allergic contact dermatitis 06/07/2021   Rash and other nonspecific skin eruption 06/07/2021   GERD (gastroesophageal reflux disease) 02/05/2020   Tobacco use 09/23/2018   Health care maintenance 02/06/2015   Obesity (BMI 30-39.9) 10/02/2014   Thyroid fullness 10/02/2014   Stress 05/01/2014   Rash 09/05/2013   Hyperglycemia 08/22/2013   Back pain 06/20/2013   Encounter for smoking cessation counseling 06/20/2013   Routine general medical examination at a health care facility 01/25/2013   Nonspecific abnormal electrocardiogram (ECG) (EKG) 01/25/2013   Screening for breast cancer 01/16/2012   Rosacea 01/16/2012   Hypertension 01/16/2012   Hypercholesterolemia 01/16/2012   Past Medical History:  Diagnosis Date   Hyperlipidemia    Hypertension    Past Surgical History: Past Surgical History:  Procedure Laterality Date   VAGINAL DELIVERY  1989   episiotomy, Dr. Arsenio Loader   Medication List:  Current Outpatient Medications  Medication Sig Dispense Refill   amLODipine (NORVASC) 2.5 MG tablet Take 1 tablet (2.5 mg total) by mouth daily. 90 tablet 1   buPROPion (WELLBUTRIN XL) 150 MG 24 hr tablet Take 1 tablet (150 mg total) by mouth 2 (two) times daily. 180 tablet 1   desonide (DESOWEN) 0.05 % ointment Apply 1 application topically 2 (two) times daily as needed (rash). Do not use more than 2 weeks in a row. 60 g 2   hydrochlorothiazide (HYDRODIURIL) 25 MG tablet TAKE 1 TABLET BY MOUTH DAILY 90 tablet 2   hydrOXYzine (ATARAX/VISTARIL) 25 MG tablet  Take 1 tablet (25 mg total) by mouth at bedtime as needed. 30 tablet 0   Multiple Vitamin (MULTIVITAMIN WITH MINERALS) TABS tablet Take 1 tablet by mouth daily.     Turmeric 500 MG CAPS Take 500 mg by mouth once.     vitamin B-12 (CYANOCOBALAMIN) 500 MCG tablet Take 500 mcg by mouth daily.     triamcinolone (KENALOG) 0.025 % cream Apply 1 application topically 2 (two) times daily. (Patient not taking: Reported on 06/06/2021) 80 g 0   No current facility-administered medications for this visit.   Allergies: Allergies  Allergen Reactions   Keflex [Cephalexin] Rash   Mupirocin    Social History: Social History   Socioeconomic History   Marital status: Single    Spouse name: Not on file   Number of children: Not on file   Years of education: Not on file   Highest education level: Not on file  Occupational History   Not on file  Tobacco Use   Smoking status: Some Days    Packs/day: 0.50    Years: 15.00    Pack years: 7.50    Types: Cigarettes   Smokeless tobacco: Never  Vaping Use   Vaping Use: Never used  Substance and Sexual Activity   Alcohol use: Yes    Alcohol/week: 0.0 standard drinks    Comment: 2-3 times a week per patient   Drug use: No   Sexual activity: Yes    Birth control/protection: Implant  Other Topics Concern   Not on file  Social History Narrative   Lives in [place]. 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      Diet: regular diet, limits fried food   Exercise: walks goal 7mn daily   Social Determinants of Health   Financial Resource Strain: Not on file  Food Insecurity: Not on file  Transportation Needs: Not on file  Physical Activity: Not on file  Stress: Not on file  Social Connections: Not on file   Lives in a 54 year old house. Smoking: 1-1.5 pack per day x 20+ years Occupation: RTherapist, sportsHistory:Freight forwarderin the house: no CCharity fundraiserin the family room: no Carpet in the bedroom: yes Heating:  gas Cooling: central Pet: no  Family History: Family History  Problem Relation Age of Onset   Diabetes Mother    Dementia Mother    Stroke Father 649  Hypertension Sister    Thyroid disease Sister    Diabetes Sister    Hypertension Brother    Diabetes Brother    Hypertension Sister    Hypertension Brother    Hypertension Brother    Hypertension Brother    Hypertension Sister    Breast cancer Neg Hx    Lupus Neg Hx    Problem                               Relation Asthma                                   No  Eczema                                No  Food allergy                          No  Allergic rhino conjunctivitis     no   Review of Systems  Constitutional:  Negative for appetite change, chills, fever and unexpected weight change.  HENT:  Negative for congestion and rhinorrhea.   Eyes:  Positive for itching.  Respiratory:  Negative for cough, chest tightness, shortness of breath and wheezing.   Cardiovascular:  Negative for chest pain.  Gastrointestinal:  Negative for abdominal pain.  Genitourinary:  Negative for difficulty urinating.  Skin:  Positive for rash.  Neurological:  Negative for headaches.   Objective: BP 130/82   Pulse 76   Temp 97.9 F (36.6 C) (Temporal)   Resp 16   Ht '5\' 11"'$  (1.803 m)   Wt 237 lb 8 oz (107.7 kg)   SpO2 98%   BMI 33.12 kg/m  Body mass index is 33.12 kg/m. Physical Exam Vitals and nursing note reviewed.  Constitutional:  Appearance: Normal appearance. She is well-developed.  HENT:     Head: Normocephalic and atraumatic.     Right Ear: Tympanic membrane and external ear normal.     Left Ear: Tympanic membrane and external ear normal.     Nose: Nose normal.     Mouth/Throat:     Mouth: Mucous membranes are moist.     Pharynx: Oropharynx is clear.  Eyes:     Conjunctiva/sclera: Conjunctivae normal.  Cardiovascular:     Rate and Rhythm: Normal rate and regular rhythm.     Heart sounds: Normal heart sounds. No  murmur heard.   No friction rub. No gallop.  Pulmonary:     Effort: Pulmonary effort is normal.     Breath sounds: Normal breath sounds. No wheezing, rhonchi or rales.  Musculoskeletal:     Cervical back: Neck supple.  Skin:    General: Skin is warm.     Findings: Rash present.     Comments: Erythematous face with scattered raised papular rash on the face.  Neurological:     Mental Status: She is alert and oriented to person, place, and time.  Psychiatric:        Behavior: Behavior normal.  The plan was reviewed with the patient/family, and all questions/concerned were addressed.  It was my pleasure to see Amyia today and participate in her care. Please feel free to contact me with any questions or concerns.  Sincerely,  Rexene Alberts, DO Allergy & Immunology  [facility] office: [place] office: [phone]

## 2021-06-06 ENCOUNTER — Encounter: Payer: Self-pay | Admitting: Allergy

## 2021-06-06 ENCOUNTER — Other Ambulatory Visit: Payer: 59

## 2021-06-06 ENCOUNTER — Ambulatory Visit: Payer: 59 | Admitting: Allergy

## 2021-06-06 ENCOUNTER — Other Ambulatory Visit: Payer: Self-pay

## 2021-06-06 VITALS — BP 130/82 | HR 76 | Temp 97.9°F | Resp 16 | Ht 71.0 in | Wt 237.5 lb

## 2021-06-06 DIAGNOSIS — L239 Allergic contact dermatitis, unspecified cause: Secondary | ICD-10-CM | POA: Diagnosis not present

## 2021-06-06 DIAGNOSIS — R21 Rash and other nonspecific skin eruption: Secondary | ICD-10-CM

## 2021-06-06 MED ORDER — DESONIDE 0.05 % EX OINT
1.0000 "application " | TOPICAL_OINTMENT | Freq: Two times a day (BID) | CUTANEOUS | 2 refills | Status: DC | PRN
  Filled 2021-06-06 (×3): qty 60, 30d supply, fill #0
  Filled 2021-06-08: qty 15, 8d supply, fill #0
  Filled 2021-06-18: qty 60, 30d supply, fill #0

## 2021-06-06 NOTE — Patient Instructions (Addendum)
Rash: Most likely due to contact dermatitis. See below for proper skin care - avoid fragrances and dyes. No perfumes, make up with fragrances. Use desonide 0.05% ointment twice a day as needed for mild rash flares - okay to use on the face, neck, groin area. Do not use more than 2 weeks at a time. Use Eucrisa (crisaborole) 2% ointment twice a day on mild rash flares on the face and body. This is a non-steroid ointment. Sample given. If this works well for you, let us know and will send in a prescription for this. If it burns, place the medication in the refrigerator.  Apply a thin layer of moisturizer and then apply the Eucrisa on top of it. Patches are best placed on Monday with return to office on Wednesday and Friday of same week for readings.  Patches once placed should not get wet.  You do not have to stop any medications for patch testing but should not be on oral prednisone. You can schedule a patch testing visit when convenient for your schedule.    Get bloodwork to rule out other etiologies. We are ordering labs, so please allow 1-2 weeks for the results to come back. With the newly implemented Cures Act, the labs might be visible to you at the same time that they become visible to me. However, I will not address the results until all of the results are back, so please be patient.  In the meantime, continue recommendations in your patient instructions, including avoidance measures (if applicable), until you hear from me.  True Test looks for the following sensitivities:        Follow up for patch testing.   Skin care recommendations  Bath time: Always use lukewarm water. AVOID very hot or cold water. Keep bathing time to 5-10 minutes. Do NOT use bubble bath. Use a mild soap and use just enough to wash the dirty areas. Do NOT scrub skin vigorously.  After bathing, pat dry your skin with a towel. Do NOT rub or scrub the skin.  Moisturizers and prescriptions:  ALWAYS apply  moisturizers immediately after bathing (within 3 minutes). This helps to lock-in moisture. Use the moisturizer several times a day over the whole body. Good summer moisturizers include: Aveeno, CeraVe, Cetaphil. Good winter moisturizers include: Aquaphor, Vaseline, Cerave, Cetaphil, Eucerin, Vanicream. When using moisturizers along with medications, the moisturizer should be applied about one hour after applying the medication to prevent diluting effect of the medication or moisturize around where you applied the medications. When not using medications, the moisturizer can be continued twice daily as maintenance.  Laundry and clothing: Avoid laundry products with added color or perfumes. Use unscented hypo-allergenic laundry products such as Tide free, Cheer free & gentle, and All free and clear.  If the skin still seems dry or sensitive, you can try double-rinsing the clothes. Avoid tight or scratchy clothing such as wool. Do not use fabric softeners or dyer sheets.

## 2021-06-07 ENCOUNTER — Encounter: Payer: Self-pay | Admitting: Allergy

## 2021-06-07 ENCOUNTER — Other Ambulatory Visit: Payer: Self-pay

## 2021-06-07 DIAGNOSIS — R21 Rash and other nonspecific skin eruption: Secondary | ICD-10-CM | POA: Insufficient documentation

## 2021-06-07 DIAGNOSIS — L239 Allergic contact dermatitis, unspecified cause: Secondary | ICD-10-CM | POA: Insufficient documentation

## 2021-06-07 MED ORDER — PRAVASTATIN SODIUM 10 MG PO TABS: 10.0000 mg | ORAL_TABLET | Freq: Every day | ORAL | 1 refills | Status: DC

## 2021-06-07 NOTE — Assessment & Plan Note (Addendum)
Pruritic rash on the face and neck for the past 5 to 10 years.  No specific triggers noted.  Use triamcinolone 0.025% cream with initial benefit but no longer working.  Saw dermatology in the past and recommended patch testing which has not been done.  Takes hydroxyzine 50 mg at night to help with itching.  Patient wears make-up and perfume, gets nails done and is a smoker.  . Based on distribution she most likely has contact dermatitis. . See below for proper skin care - avoid fragrances and dyes. o No perfumes, make up with fragrances. . The smoking probably is also irritating her skin - recommend cessation.  . Use desonide 0.05% ointment twice a day as needed for mild rash flares - okay to use on the face, neck, groin area. Do not use more than 2 weeks at a time.  Use Eucrisa (crisaborole) 2% ointment twice a day on mild rash flares on the face and body. This is a non-steroid ointment. Sample given.  If this works well for you, let us know and will send in a prescription for this. . Recommend patch testing.  . Unable to skin test today due to recent antihistamine intake. . Get bloodwork to rule out other etiologies.

## 2021-06-07 NOTE — Addendum Note (Signed)
Addended byElpidio Galea T on: 06/07/2021 08:54 AM   Modules accepted: Orders

## 2021-06-07 NOTE — Addendum Note (Signed)
Addended byElpidio Galea T on: 06/07/2021 08:57 AM   Modules accepted: Orders

## 2021-06-08 ENCOUNTER — Other Ambulatory Visit: Payer: Self-pay

## 2021-06-08 ENCOUNTER — Telehealth: Payer: Self-pay | Admitting: Allergy

## 2021-06-08 MED ORDER — HYDROCORTISONE 2.5 % EX OINT: TOPICAL_OINTMENT | Freq: Two times a day (BID) | CUTANEOUS | 2 refills | Status: DC | PRN

## 2021-06-08 MED ORDER — HYDROCORTISONE 2.5 % EX OINT
TOPICAL_OINTMENT | Freq: Two times a day (BID) | CUTANEOUS | 2 refills | Status: DC | PRN
  Filled 2021-06-08: qty 28.35, 14d supply, fill #0

## 2021-06-08 NOTE — Telephone Encounter (Signed)
Sent in hydrocortisone 2.5% ointment.

## 2021-06-08 NOTE — Telephone Encounter (Signed)
Please call patient and let her know that i sent in the Rx to both [facility] and [place].   Hydrocortisone 2.5% ointment.

## 2021-06-08 NOTE — Telephone Encounter (Signed)
[facility] pharmacy is stating RX Rx #: [id number]  desonide (DESOWEN) 0.05 % ointment [id number] is not covered under the insurance asking for an alternative medication please advise

## 2021-06-08 NOTE — Telephone Encounter (Signed)
I attempted a PA but it cannot be done because it is considered an excluded product. As best as I can tell other "low potency topical steroids" that should be covered include the following: ALCLOMETASONE DIPROPIONATE and HYDROCORTISONE 2.5% OINT.

## 2021-06-08 NOTE — Addendum Note (Signed)
Addended by: Garnet Sierras on: 06/08/2021 04:56 PM   Modules accepted: Orders

## 2021-06-10 LAB — ALLERGENS W/TOTAL IGE AREA 2

## 2021-06-10 LAB — SEDIMENTATION RATE: Sed Rate: 13 mm/hr (ref 0–40)

## 2021-06-10 LAB — ALPHA-GAL PANEL
Allergen Lamb IgE: <0.1 kU/L
Beef IgE: <0.1 kU/L
IgE (Immunoglobulin E), Serum: 35 IU/mL (ref 6–495)
O215-IgE Alpha-Gal: <0.1 kU/L
Pork IgE: <0.1 kU/L

## 2021-06-10 LAB — C-REACTIVE PROTEIN: CRP: <1 mg/L (ref 0–10)

## 2021-06-10 LAB — ANA W/REFLEX: Anti Nuclear Antibody (ANA): NEGATIVE

## 2021-06-10 LAB — TRYPTASE: Tryptase: 3.1 ug/L (ref 2.2–13.2)

## 2021-06-11 ENCOUNTER — Other Ambulatory Visit: Payer: Self-pay

## 2021-06-11 NOTE — Telephone Encounter (Signed)
Left a detailed message informing patient of medication change, informed patient that if she has any questions or concerns to call the office.

## 2021-06-12 ENCOUNTER — Other Ambulatory Visit: Payer: Self-pay

## 2021-06-18 ENCOUNTER — Other Ambulatory Visit: Payer: Self-pay

## 2021-06-26 ENCOUNTER — Telehealth: Payer: Self-pay

## 2021-06-26 NOTE — Telephone Encounter (Signed)
Paula Sierras, DO  [facility] Please call patient. Mychart message sent regarding results but patient did not view for over 14 days.   I reviewed the bloodwork.  Autoimmune screener, inflammation markers, tryptase (checks for mast cell issues) and alpha gal (checks for red meat allergy) were all normal which is great.     Environmental allergy panel was negative to indoor and outdoor allergens.     Based on these results no cause for your rash.  As discussed at the last visit - concern for contact dermatitis and recommend patch testing next.   Pt informed of the lab results and wants to see dermatologist  before moving forward with the patch testing

## 2021-06-28 ENCOUNTER — Other Ambulatory Visit: Payer: Self-pay

## 2021-06-28 ENCOUNTER — Telehealth: Payer: Self-pay

## 2021-06-28 MED ORDER — PRAVASTATIN SODIUM 10 MG PO TABS
10.0000 mg | ORAL_TABLET | Freq: Every day | ORAL | 1 refills | Status: DC
  Filled 2021-06-28: qty 90, 90d supply, fill #0
  Filled 2021-09-26: qty 90, 90d supply, fill #1

## 2021-06-28 NOTE — Telephone Encounter (Signed)
Please send pravastatin (PRAVACHOL) 10 MG tablet to [place] and take the [facility] out of her chart. She never picked up the one that was sent there because she gets all meds at [facility]

## 2021-07-05 ENCOUNTER — Other Ambulatory Visit: Payer: Self-pay

## 2021-07-17 ENCOUNTER — Other Ambulatory Visit: Payer: Self-pay

## 2021-07-17 ENCOUNTER — Other Ambulatory Visit (INDEPENDENT_AMBULATORY_CARE_PROVIDER_SITE_OTHER): Payer: 59

## 2021-07-17 DIAGNOSIS — E7800 Pure hypercholesterolemia, unspecified: Secondary | ICD-10-CM

## 2021-07-17 LAB — HEPATIC FUNCTION PANEL
ALT: 34 U/L (ref 0–35)
AST: 25 U/L (ref 0–37)
Albumin: 4.4 g/dL (ref 3.5–5.2)
Alkaline Phosphatase: 75 U/L (ref 39–117)
Bilirubin, Direct: 0.1 mg/dL (ref 0.0–0.3)
Total Bilirubin: 0.5 mg/dL (ref 0.2–1.2)
Total Protein: 6.7 g/dL (ref 6.0–8.3)

## 2021-08-08 ENCOUNTER — Other Ambulatory Visit (HOSPITAL_COMMUNITY)
Admission: RE | Admit: 2021-08-08 | Discharge: 2021-08-08 | Disposition: A | Discharge status: Home / Self Care | Payer: 59 | Source: Ambulatory Visit | Attending: Internal Medicine | Admitting: Internal Medicine

## 2021-08-08 ENCOUNTER — Ambulatory Visit (INDEPENDENT_AMBULATORY_CARE_PROVIDER_SITE_OTHER): Payer: 59 | Admitting: Internal Medicine

## 2021-08-08 ENCOUNTER — Encounter: Payer: Self-pay | Admitting: Internal Medicine

## 2021-08-08 ENCOUNTER — Other Ambulatory Visit: Payer: Self-pay

## 2021-08-08 VITALS — BP 128/80 | HR 84 | Temp 97.6°F | Resp 16 | Ht 71.0 in | Wt 241.8 lb

## 2021-08-08 DIAGNOSIS — E0789 Other specified disorders of thyroid: Secondary | ICD-10-CM | POA: Diagnosis not present

## 2021-08-08 DIAGNOSIS — R21 Rash and other nonspecific skin eruption: Secondary | ICD-10-CM | POA: Diagnosis not present

## 2021-08-08 DIAGNOSIS — Z0000 Encounter for general adult medical examination without abnormal findings: Secondary | ICD-10-CM | POA: Diagnosis not present

## 2021-08-08 DIAGNOSIS — Z124 Encounter for screening for malignant neoplasm of cervix: Secondary | ICD-10-CM | POA: Diagnosis not present

## 2021-08-08 DIAGNOSIS — R739 Hyperglycemia, unspecified: Secondary | ICD-10-CM

## 2021-08-08 DIAGNOSIS — I10 Essential (primary) hypertension: Secondary | ICD-10-CM

## 2021-08-08 DIAGNOSIS — Z122 Encounter for screening for malignant neoplasm of respiratory organs: Secondary | ICD-10-CM

## 2021-08-08 DIAGNOSIS — F439 Reaction to severe stress, unspecified: Secondary | ICD-10-CM

## 2021-08-08 DIAGNOSIS — E7800 Pure hypercholesterolemia, unspecified: Secondary | ICD-10-CM | POA: Diagnosis not present

## 2021-08-08 DIAGNOSIS — Z720 Tobacco use: Secondary | ICD-10-CM

## 2021-08-08 NOTE — Assessment & Plan Note (Signed)
Physical today 08/08/21.  PAP 08/08/21.  colonoscopy 11/2017 - recommended f/u in 10 years.  Mammogram 04/18/21 - [facility] I.

## 2021-08-08 NOTE — Progress Notes (Signed)
[name], female   DOB: 1966/06/24, 55 y.o.   MRN: [id number]   Subjective:    [name], female    DOB: 13-Jun-1966, 55 y.o.   MRN: [id number]  This visit occurred during the SARS-CoV-2 public health emergency.  Safety protocols were in place, including screening questions prior to the visit, additional usage of staff PPE, and extensive cleaning of exam room while observing appropriate contact time as indicated for disinfecting solutions.   Patient here for her physical.   Chief Complaint  Patient presents with   Annual Exam   .   HPI Is having persistent problems with her face - rash/breaking out.  Has seen an allergist.  Has been using a cream.  Appears to be worse with stress.  Hot - itches.  Request/discussed referral to dermatology.  Trying to stay active.  No chest pain reported.  Breathing stable.  No increased acid reflux.  No abdominal pain reported.  Bowels stable.    Past Medical History:  Diagnosis Date   Hyperlipidemia    Hypertension    Past Surgical History:  Procedure Laterality Date   VAGINAL DELIVERY  1989   episiotomy, Dr. Arsenio Loader   Family History  Problem Relation Age of Onset   Diabetes Mother    Dementia Mother    Stroke Father 31   Hypertension Sister    Thyroid disease Sister    Diabetes Sister    Hypertension Brother    Diabetes Brother    Hypertension Sister    Hypertension Brother    Hypertension Brother    Hypertension Brother    Hypertension Sister    Breast cancer Neg Hx    Lupus Neg Hx    Social History   Socioeconomic History   Marital status: Single    Spouse name: Not on file   Number of children: Not on file   Years of education: Not on file   Highest education level: Not on file  Occupational History   Not on file  Tobacco Use   Smoking status: Some Days    Packs/day: 0.50    Years: 15.00    Pack years: 7.50    Types: Cigarettes   Smokeless tobacco: Never  Vaping Use   Vaping  Use: Never used  Substance and Sexual Activity   Alcohol use: Yes    Alcohol/week: 0.0 standard drinks    Comment: 2-3 times a week per patient   Drug use: No   Sexual activity: Yes    Birth control/protection: Implant  Other Topics Concern   Not on file  Social History Narrative   Lives in [place]. 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      Diet: regular diet, limits fried food   Exercise: walks goal 17mn daily   Social Determinants of Health   Financial Resource Strain: Not on file  Food Insecurity: Not on file  Transportation Needs: Not on file  Physical Activity: Not on file  Stress: Not on file  Social Connections: Not on file     Review of Systems  Constitutional:  Negative for appetite change and unexpected weight change.  HENT:  Negative for congestion, sinus pressure and sore throat.   Eyes:  Negative for pain and visual disturbance.  Respiratory:  Negative for cough, chest tightness and shortness of breath.   Cardiovascular:  Negative for chest pain, palpitations and leg swelling.  Gastrointestinal:  Negative for abdominal pain, diarrhea, nausea and vomiting.  Genitourinary:  Negative for difficulty urinating and dysuria.  Musculoskeletal:  Negative for back pain and joint swelling.  Skin:  Negative for color change.       Rash/skin changes on face as outlined.   Neurological:  Negative for dizziness, light-headedness and headaches.  Hematological:  Negative for adenopathy. Does not bruise/bleed easily.  Psychiatric/Behavioral:  Negative for agitation and dysphoric mood.       Objective:     BP 128/80   Pulse 84   Temp 97.6 F (36.4 C)   Resp 16   Ht 5' 11"  (1.803 m)   Wt 241 lb 12.8 oz (109.7 kg)   SpO2 99%   BMI 33.72 kg/m  Wt Readings from Last 3 Encounters:  08/08/21 241 lb 12.8 oz (109.7 kg)  06/06/21 237 lb 8 oz (107.7 kg)  04/06/21 240 lb 9.6 oz (109.1 kg)    Physical Exam Vitals reviewed.  Constitutional:       General: She is not in acute distress.    Appearance: Normal appearance. She is well-developed.  HENT:     Head: Normocephalic and atraumatic.     Right Ear: External ear normal.     Left Ear: External ear normal.  Eyes:     General: No scleral icterus.       Right eye: No discharge.        Left eye: No discharge.     Conjunctiva/sclera: Conjunctivae normal.  Neck:     Thyroid: No thyromegaly.  Cardiovascular:     Rate and Rhythm: Normal rate and regular rhythm.  Pulmonary:     Effort: No tachypnea, accessory muscle usage or respiratory distress.     Breath sounds: Normal breath sounds. No decreased breath sounds or wheezing.  Chest:  Breasts:    Right: No inverted nipple, mass, nipple discharge or tenderness (no axillary adenopathy).     Left: No inverted nipple, mass, nipple discharge or tenderness (no axilarry adenopathy).  Abdominal:     General: Bowel sounds are normal.     Palpations: Abdomen is soft.     Tenderness: There is no abdominal tenderness.  Genitourinary:    Comments: Normal external genitalia.  Vaginal vault without lesions.  Cervix identified.  Pap smear performed.  Could not appreciate any adnexal masses or tenderness.   Musculoskeletal:        General: No swelling or tenderness.     Cervical back: Neck supple.  Lymphadenopathy:     Cervical: No cervical adenopathy.  Skin:    Findings: No erythema.     Comments: Rash - face.   Neurological:     Mental Status: She is alert and oriented to person, place, and time.  Psychiatric:        Mood and Affect: Mood normal.        Behavior: Behavior normal.     Outpatient Encounter Medications as of 08/08/2021  Medication Sig   amLODipine (NORVASC) 2.5 MG tablet Take 1 tablet (2.5 mg total) by mouth daily.   buPROPion (WELLBUTRIN XL) 150 MG 24 hr tablet Take 1 tablet (150 mg total) by mouth 2 (two) times daily.   hydrochlorothiazide (HYDRODIURIL) 25 MG tablet TAKE 1 TABLET BY MOUTH DAILY   Multiple  Vitamin (MULTIVITAMIN WITH MINERALS) TABS tablet Take 1 tablet by mouth daily.   pravastatin (PRAVACHOL) 10 MG tablet Take 1 tablet (10 mg total) by mouth daily.   Turmeric 500 MG CAPS Take 500 mg by mouth once.   [DISCONTINUED] desonide (DESOWEN) 0.05 %  ointment Apply 1 application topically 2 (two) times daily as needed (rash). Do not use more than 2 weeks in a row.   [DISCONTINUED] hydrocortisone 2.5 % ointment Apply topically 2 (two) times daily as needed (mild rash). Okay to use on the face. Do not use more than 2 weeks at a time.   [DISCONTINUED] hydrOXYzine (ATARAX/VISTARIL) 25 MG tablet Take 1 tablet (25 mg total) by mouth at bedtime as needed.   [DISCONTINUED] triamcinolone (KENALOG) 0.025 % cream Apply 1 application topically 2 (two) times daily. (Patient not taking: No sig reported)   [DISCONTINUED] vitamin B-12 (CYANOCOBALAMIN) 500 MCG tablet Take 500 mcg by mouth daily.   No facility-administered encounter medications on file as of 08/08/2021.     Lab Results  Component Value Date   WBC 6.1 02/06/2021   HGB 14.5 02/06/2021   HCT 42.2 02/06/2021   PLT 237.0 02/06/2021   GLUCOSE 111 (H) 06/04/2021   CHOL 235 (H) 06/04/2021   TRIG 96.0 06/04/2021   HDL 75.30 06/04/2021   LDLCALC 140 (H) 06/04/2021   ALT 34 07/17/2021   AST 25 07/17/2021   NA 142 06/04/2021   K 4.0 06/04/2021   CL 103 06/04/2021   CREATININE 0.85 06/04/2021   BUN 17 06/04/2021   CO2 27 06/04/2021   TSH 1.35 06/04/2021   HGBA1C 6.0 06/04/2021    MM 3D SCREEN BREAST BILATERAL  Result Date: 04/18/2021 CLINICAL DATA:  Screening. EXAM: DIGITAL SCREENING BILATERAL MAMMOGRAM WITH TOMOSYNTHESIS AND CAD TECHNIQUE: Bilateral screening digital craniocaudal and mediolateral oblique mammograms were obtained. Bilateral screening digital breast tomosynthesis was performed. The images were evaluated with computer-aided detection. COMPARISON:  Previous exam(s). ACR Breast Density Category b: There are scattered areas of  fibroglandular density. FINDINGS: There are no findings suspicious for malignancy. IMPRESSION: No mammographic evidence of malignancy. A result letter of this screening mammogram will be mailed directly to the patient. RECOMMENDATION: Screening mammogram in one year. (Code:SM-B-01Y) BI-RADS CATEGORY  1: Negative. Electronically Signed   By: Lillia Mountain M.D.   On: 04/18/2021 08:45      Assessment & Plan:   Problem List Items Addressed This Visit     Health care maintenance    Physical today 08/08/21.  PAP 08/08/21.  colonoscopy 11/2017 - recommended f/u in 10 years.  Mammogram 04/18/21 - [facility] I.       Hypercholesterolemia    On pravastatin.  Low cholesterol diet and exercise.  Follow lipid panel.        Relevant Orders   Hepatic function panel   Lipid panel   Hyperglycemia    Low carb diet and exercise.  Follow met b and a1c.       Relevant Orders   Hemoglobin A1c   Hypertension    On hctz and amlodipine.  Pressures as outlined. Follow metabolic panel.       Relevant Orders   Basic metabolic panel   Rash    Persistent facial rash.  Has seen an allergist.  Request referral to dermatology.        Relevant Orders   Ambulatory referral to Dermatology   Routine general medical examination at a health care facility - Primary   Stress    On wellbutrin.  Overall appears to be handling things relatively well.  Has good support.  Follow.       Thyroid fullness    Previously evaluated by Dr Eddie Dibbles.  Was previously scheduled for f/u thyroid ultrasound.  Cancelled due to financial issues.  Will  plan for f/u with endocrinology.       Tobacco use    Smokes 1ppd.  Discussed screening chest CT.  She is agreeable.        Other Visit Diagnoses     Cervical cancer screening       Relevant Orders   Cytology - PAP( [place]) (Completed)   Screening for lung cancer       Relevant Orders   Ambulatory Referral Lung Cancer Screening [facility]        Einar Pheasant, MD

## 2021-08-10 LAB — CYTOLOGY - PAP
Comment: NEGATIVE
Diagnosis: UNDETERMINED — AB
High risk HPV: NEGATIVE

## 2021-08-19 ENCOUNTER — Encounter: Payer: Self-pay | Admitting: Internal Medicine

## 2021-08-19 ENCOUNTER — Telehealth: Payer: Self-pay | Admitting: Internal Medicine

## 2021-08-19 DIAGNOSIS — E0789 Other specified disorders of thyroid: Secondary | ICD-10-CM

## 2021-08-19 NOTE — Assessment & Plan Note (Signed)
On pravastatin.  Low cholesterol diet and exercise.  Follow lipid panel.   

## 2021-08-19 NOTE — Assessment & Plan Note (Signed)
Previously evaluated by Dr Eddie Dibbles.  Was previously scheduled for f/u thyroid ultrasound.  Cancelled due to financial issues.  Will plan for f/u with endocrinology.

## 2021-08-19 NOTE — Assessment & Plan Note (Signed)
Low carb diet and exercise.  Follow met b and a1c.  

## 2021-08-19 NOTE — Assessment & Plan Note (Signed)
Smokes 1ppd.  Discussed screening chest CT.  She is agreeable.

## 2021-08-19 NOTE — Telephone Encounter (Signed)
Previously saw Endocrinology (Dr Eddie Dibbles) for her thyroid.  Had discussed f/u with her.  If she is agreeable now, I would like to schedule f/u with endocrinology to confirm thyroid stable.  If agreeable, will need referral to [facility] endocrinology.  (Dr Eddie Dibbles is no longer there, so will be with another endocrinologist).

## 2021-08-19 NOTE — Assessment & Plan Note (Signed)
On wellbutrin.  Overall appears to be handling things relatively well.  Has good support.  Follow.  

## 2021-08-19 NOTE — Assessment & Plan Note (Signed)
Persistent facial rash.  Has seen an allergist.  Request referral to dermatology.

## 2021-08-19 NOTE — Assessment & Plan Note (Signed)
On hctz and amlodipine.  Pressures as outlined. Follow metabolic panel.

## 2021-08-22 ENCOUNTER — Encounter: Payer: Self-pay | Admitting: Dermatology

## 2021-08-22 ENCOUNTER — Ambulatory Visit: Payer: 59 | Admitting: Dermatology

## 2021-08-22 ENCOUNTER — Other Ambulatory Visit: Payer: Self-pay

## 2021-08-22 DIAGNOSIS — L710 Perioral dermatitis: Secondary | ICD-10-CM | POA: Diagnosis not present

## 2021-08-22 MED ORDER — DOXYCYCLINE HYCLATE 20 MG PO TABS
20.0000 mg | ORAL_TABLET | Freq: Two times a day (BID) | ORAL | 2 refills | Status: AC
  Filled 2021-08-22 (×2): qty 60, 30d supply, fill #0

## 2021-08-22 NOTE — Addendum Note (Signed)
Addended by: Nanci Pina on: 08/22/2021 12:22 PM   Modules accepted: Orders

## 2021-08-22 NOTE — Telephone Encounter (Signed)
Patient notified and willing to return to [facility] endocrinology. Referral as been placed.

## 2021-08-22 NOTE — Patient Instructions (Addendum)
Call if not clearing in 2 weeks    Doxycycline should be taken with food to prevent nausea. Do not lay down for 30 minutes after taking. Be cautious with sun exposure and use good sun protection while on this medication. Pregnant women should not take this medication.   Recommend taking Heliocare sun protection supplement daily in sunny weather for additional sun protection. For maximum protection on the sunniest days, you can take up to 2 capsules of regular Heliocare OR take 1 capsule of Heliocare Ultra. For prolonged exposure (such as a full day in the sun), you can repeat your dose of the supplement 4 hours after your first dose. Heliocare can be purchased at [facility] or at [email or web address].     Recommend daily broad spectrum sunscreen SPF 30+ to sun-exposed areas, reapply every 2 hours as needed. Call for new or changing lesions.  Staying in the shade or wearing long sleeves, sun glasses (UVA+UVB protection) and wide brim hats (4-inch brim around the entire circumference of the hat) are also recommended for sun protection.   If you have any questions or concerns for your doctor, please call our main line at [phone] and press option 4 to reach your doctor's medical assistant. If no one answers, please leave a voicemail as directed and we will return your call as soon as possible. Messages left after 4 pm will be answered the following business day.   You may also send [place] a message via [place]. We typically respond to MyChart messages within 1-2 business days.  For prescription refills, please ask your pharmacy to contact our office. Our fax number is [phone].  If you have an urgent issue when the clinic is closed that cannot wait until the next business day, you can page your doctor at the number below.    Please note that while we do our best to be available for urgent issues outside of office hours, we are not available 24/7.   If you have an urgent issue and are  unable to reach [place], you may choose to seek medical care at your doctor's office, retail clinic, urgent care center, or emergency room.  If you have a medical emergency, please immediately call 911 or go to the emergency department.  Pager Numbers  - Dr. Nehemiah Massed: [phone]  - Dr. Laurence Ferrari: [phone]  - Dr. Nicole Kindred: [phone]  In the event of inclement weather, please call our main line at [phone] for an update on the status of any delays or closures.  Dermatology Medication Tips: Please keep the boxes that topical medications come in in order to help keep track of the instructions about where and how to use these. Pharmacies typically print the medication instructions only on the boxes and not directly on the medication tubes.   If your medication is too expensive, please contact our office at [phone] option 4 or send [place] a message through [place].   We are unable to tell what your co-pay for medications will be in advance as this is different depending on your insurance coverage. However, we may be able to find a substitute medication at lower cost or fill out paperwork to get insurance to cover a needed medication.   If a prior authorization is required to get your medication covered by your insurance company, please allow [place] 1-2 business days to complete this process.  Drug prices often vary depending on where the prescription is filled and some pharmacies may offer cheaper prices.  The website www.goodrx.com  contains coupons for medications through different pharmacies. The prices here do not account for what the cost may be with help from insurance (it may be cheaper with your insurance), but the website can give you the price if you did not use any insurance.  - You can print the associated coupon and take it with your prescription to the pharmacy.  - You may also stop by our office during regular business hours and pick up a GoodRx coupon card.  - If you need your  prescription sent electronically to a different pharmacy, notify our office through [facility] or by phone at [phone] option 4.

## 2021-08-22 NOTE — Progress Notes (Signed)
   New Patient Visit  Subjective  Paula Wallace is a 55 y.o. female who presents for the following: New Patient (Initial Visit) (Patient reports a rash at face she has been dealing with years. She gets patches all over face sometimes dry and scaly. Also happens at neck. Sometimes itches. Prescribed hydrocortisone 2.5 states made it . Patient reports  thought maybe allergic reaction to visteril she has stopped medication. Patient has been to allergist and could not find anything. Face and neck).    The following portions of the chart were reviewed this encounter and updated as appropriate:   Tobacco  Allergies  Meds  Problems  Med Hx  Surg Hx  Fam Hx      Review of Systems:  No other skin or systemic complaints except as noted in HPI or Assessment and Plan.  Objective  Well appearing patient in no apparent distress; mood and affect are within normal limits.  A focused examination was performed including face, neck. Relevant physical exam findings are noted in the Assessment and Plan.  face and neck Mid face inflammatory papules favoring perioral but extending submandibular, upper neck, and mid-forehead   Assessment & Plan  Perioral dermatitis face and neck  Chronic condition with duration over one year. Condition is bothersome to patient. Currently flared.  Perioral dermatitis is an eruption which is usually located around the mouth and nose.  It can be a rash and/or red bumps.  It occasionally occurs around the eyes.  It may be itchy and may burn.  The exact cause is unknown.  Some types of makeup, moisturizers, dental products, and prescription creams may be partially responsible for the eruption.  Topical steroids such as cortisone creams can temporarily make the rash better but with discontinuation the rash tends to recur and worsen, so they should be avoided. Topical antibiotics, elidel cream, protopic ointment, and oral antibiotics may be prescribed to treat this condition.   Although perioral dermatitis is not an infection, some antibiotics have anti-inflammatory properties that help it greatly.  Start Doxycycline 20 mg by mouth twice daily with food.  Patient to call in 2 weeks if not improving and would increase to doxycycline 100 mg twice a day for 10 days and then decrease back to 20 mg twice a day     Discussed this typically requires treatment for around 3 months but sometimes longer  Doxycycline should be taken with food to prevent nausea. Do not lay down for 30 minutes after taking. Be cautious with sun exposure and use good sun protection while on this medication. Pregnant women should not take this medication.    doxycycline (PERIOSTAT) 20 MG tablet - face and neck Take 1 tablet (20 mg total) by mouth 2 (two) times daily. Take with food.  Return for 4 - 6 week follow up on perioral derm .  I, Ruthell Rummage, CMA, am acting as scribe for Forest Gleason, MD.  Documentation: I have reviewed the above documentation for accuracy and completeness, and I agree with the above.  Forest Gleason, MD

## 2021-08-23 ENCOUNTER — Other Ambulatory Visit: Payer: Self-pay

## 2021-09-04 ENCOUNTER — Other Ambulatory Visit: Payer: Self-pay

## 2021-09-26 ENCOUNTER — Other Ambulatory Visit: Payer: Self-pay | Admitting: Internal Medicine

## 2021-09-26 ENCOUNTER — Other Ambulatory Visit: Payer: Self-pay

## 2021-09-26 MED ORDER — AMLODIPINE BESYLATE 2.5 MG PO TABS
2.5000 mg | ORAL_TABLET | Freq: Every day | ORAL | 1 refills | Status: DC
  Filled 2021-09-26: qty 90, 90d supply, fill #0
  Filled 2022-01-03: qty 90, 90d supply, fill #1

## 2021-10-02 ENCOUNTER — Other Ambulatory Visit: Payer: Self-pay

## 2021-10-02 ENCOUNTER — Ambulatory Visit: Payer: 59 | Admitting: Dermatology

## 2021-10-02 ENCOUNTER — Encounter: Payer: Self-pay | Admitting: Dermatology

## 2021-10-02 DIAGNOSIS — L308 Other specified dermatitis: Secondary | ICD-10-CM | POA: Diagnosis not present

## 2021-10-02 MED ORDER — OPZELURA 1.5 % EX CREA: 1.0000 "application " | TOPICAL_CREAM | Freq: Every day | CUTANEOUS | 2 refills | Status: DC

## 2021-10-02 MED ORDER — PIMECROLIMUS 1 % EX CREA: TOPICAL_CREAM | CUTANEOUS | 3 refills | Status: DC

## 2021-10-02 NOTE — Progress Notes (Signed)
   Follow-Up Visit   Subjective  Paula Wallace is a 55 y.o. female who presents for the following: Dermatitis (6 weeks f/u perioral dermatitis, pt taking Doxycycline with a poor response ).  The following portions of the chart were reviewed this encounter and updated as appropriate:   Tobacco  Allergies  Meds  Problems  Med Hx  Surg Hx  Fam Hx      Review of Systems:  No other skin or systemic complaints except as noted in HPI or Assessment and Plan.  Objective  Well appearing patient in no apparent distress; mood and affect are within normal limits.  A focused examination was performed including face,neck . Relevant physical exam findings are noted in the Assessment and Plan.  face Xerotic erythematous patches and plaques on the lower face, eyelids and neck    Assessment & Plan  Other eczema face  Chronic condition with duration over one year. Condition is bothersome to patient. Currently flared.  Exam c/w eczema today. Currently clear of Perioral dermatitis (previously had discrete inflammatory papules)  Atopic dermatitis (eczema) is a chronic, relapsing, pruritic condition that can significantly affect quality of life. It is often associated with allergic rhinitis and/or asthma and can require treatment with topical medications, phototherapy, or in severe cases biologic injectable medication (Dupixent; Adbry) or Oral JAK inhibitors.   D/C Doxycycline   Start samples of Opzelura cream apply to face once a day Lot #[id number]  Exp 01/2023  If Opezelura not covered or if she does not clear, Start Pimecrolimus cream apply to face twice a day  If no better in 2 weeks call here and let us know   Related Medications Ruxolitinib Phosphate (OPZELURA) 1.5 % CREA Apply 1 application topically daily. Apply to face daily  pimecrolimus (ELIDEL) 1 % cream Apply to face twice a day  Return in about 3 weeks (around 10/23/2021) for Eczema .  I, Marye Round, CMA, am acting as  scribe for Forest Gleason, MD .   Documentation: I have reviewed the above documentation for accuracy and completeness, and I agree with the above.  Forest Gleason, MD

## 2021-10-02 NOTE — Patient Instructions (Signed)

## 2021-10-04 ENCOUNTER — Telehealth: Payer: Self-pay | Admitting: Internal Medicine

## 2021-10-04 DIAGNOSIS — E0789 Other specified disorders of thyroid: Secondary | ICD-10-CM

## 2021-10-04 NOTE — Telephone Encounter (Signed)
Notify - endocrinology in [place] not taking new pts.  See if agreeable to see [facility] endocrinology for thyroid fullness.

## 2021-10-04 NOTE — Telephone Encounter (Signed)
Rejection Reason - Not taking new patients - Unfortunately, due to extremely limited availability, Dr. Gabriel Carina and Dr. Honor Junes are unable to accept new patients at this time. Please re-direct this referral to a different endocrinology office. We apologize for the inconvenience." Doyle Askew said on Oct 04, 2021 11:52 AM  Msg from [facility] endo

## 2021-10-08 ENCOUNTER — Other Ambulatory Visit (INDEPENDENT_AMBULATORY_CARE_PROVIDER_SITE_OTHER): Payer: 59

## 2021-10-08 DIAGNOSIS — E7800 Pure hypercholesterolemia, unspecified: Secondary | ICD-10-CM

## 2021-10-08 DIAGNOSIS — I10 Essential (primary) hypertension: Secondary | ICD-10-CM | POA: Diagnosis not present

## 2021-10-08 DIAGNOSIS — R739 Hyperglycemia, unspecified: Secondary | ICD-10-CM | POA: Diagnosis not present

## 2021-10-08 LAB — HEPATIC FUNCTION PANEL
ALT: 26 U/L (ref 0–35)
AST: 20 U/L (ref 0–37)
Albumin: 4.3 g/dL (ref 3.5–5.2)
Alkaline Phosphatase: 70 U/L (ref 39–117)
Bilirubin, Direct: 0.1 mg/dL (ref 0.0–0.3)
Total Bilirubin: 0.4 mg/dL (ref 0.2–1.2)
Total Protein: 6.8 g/dL (ref 6.0–8.3)

## 2021-10-08 LAB — LIPID PANEL
Cholesterol: 208 mg/dL — ABNORMAL HIGH (ref 0–200)
HDL: 70.6 mg/dL (ref >39.00)
LDL Cholesterol: 117 mg/dL — ABNORMAL HIGH (ref 0–99)
NonHDL: 137.36
Total CHOL/HDL Ratio: 3
Triglycerides: 100 mg/dL (ref 0.0–149.0)
VLDL: 20 mg/dL (ref 0.0–40.0)

## 2021-10-08 LAB — BASIC METABOLIC PANEL
BUN: 15 mg/dL (ref 6–23)
CO2: 24 mEq/L (ref 19–32)
Calcium: 9.4 mg/dL (ref 8.4–10.5)
Chloride: 105 mEq/L (ref 96–112)
Creatinine, Ser: 0.65 mg/dL (ref 0.40–1.20)
GFR: 99.81 mL/min (ref >60.00)
Glucose, Bld: 94 mg/dL (ref 70–99)
Potassium: 4.1 mEq/L (ref 3.5–5.1)
Sodium: 139 mEq/L (ref 135–145)

## 2021-10-08 LAB — HEMOGLOBIN A1C: Hgb A1c MFr Bld: 6.2 % (ref 4.6–6.5)

## 2021-10-08 NOTE — Telephone Encounter (Signed)
LMTCB

## 2021-10-09 ENCOUNTER — Other Ambulatory Visit: Payer: Self-pay

## 2021-10-09 MED ORDER — BUPROPION HCL ER (XL) 150 MG PO TB24
150.0000 mg | ORAL_TABLET | Freq: Two times a day (BID) | ORAL | 1 refills | Status: DC
  Filled 2021-10-09: qty 180, 90d supply, fill #0
  Filled 2022-01-03: qty 180, 90d supply, fill #1

## 2021-10-09 NOTE — Telephone Encounter (Signed)
Pt agreeable to referral. Referral placed. Wellbutrin refiilled per pt request

## 2021-10-09 NOTE — Telephone Encounter (Signed)
Patient called in advised returning a call to Larena Glassman, Please call patient back @ [phone]

## 2021-10-17 ENCOUNTER — Ambulatory Visit (LOCAL_COMMUNITY_HEALTH_CENTER): Payer: 59 | Admitting: Family Medicine

## 2021-10-17 ENCOUNTER — Other Ambulatory Visit: Payer: Self-pay

## 2021-10-17 VITALS — BP 137/87 | Ht 71.0 in | Wt 241.4 lb

## 2021-10-17 DIAGNOSIS — Z3009 Encounter for other general counseling and advice on contraception: Secondary | ICD-10-CM

## 2021-10-17 DIAGNOSIS — Z3046 Encounter for surveillance of implantable subdermal contraceptive: Secondary | ICD-10-CM

## 2021-10-17 NOTE — Progress Notes (Signed)
S: pt on clinic for nexplanon removal   O: nexplanon was placed in 2018  A/P:  Nexplanon Removal Patient identified, informed consent performed, consent signed.   Appropriate time out taken. Nexplanon site identified.  Area prepped in usual sterile fashon. 3 ml of 1% lidocaine with Epinephrine was used to anesthetize the area at the distal end of the implant and along implant site. A small stab incision was made right beside the implant on the distal portion.  The Nexplanon rod was grasped using hemostats and removed without difficulty.  There was minimal blood loss. There were no complications.  Steri-strips were applied over the small incision.  A pressure bandage was applied to reduce any bruising.  The patient tolerated the procedure well and was given post procedure instructions.     Counseled patient to take OTC analgesic starting as soon as lidocaine starts to wear off and take regularly for at least 48 hr to decrease discomfort.  Specifically to take with food or milk to decrease stomach upset and for IB 600 mg (3 tablets) every 6 hrs; IB 800 mg (4 tablets) every 8 hrs; or Aleve 2 tablets every 12 hrs.   Pt to follow up with PCP to see if have gone through menopause.   Junious Dresser, FNP

## 2021-10-18 ENCOUNTER — Encounter: Payer: Self-pay | Admitting: Internal Medicine

## 2021-10-19 ENCOUNTER — Telehealth: Payer: Self-pay | Admitting: Internal Medicine

## 2021-10-19 NOTE — Telephone Encounter (Signed)
Patient called states would like to speak with Larena Glassman, to see if any blood work can be done to see if patient is in menopause. Patient would like a call back phone # [phone]

## 2021-10-19 NOTE — Telephone Encounter (Signed)
Spoke with patient. She is aware that you are out of the office until next week.Marland Kitchen She was just wanting to check and see if you could order some labs to check and see if she is in menopause or have been through menopause. This is not something that needs to be done prior to your return I was just not sure what labs needed to be ordered. There may be a my chart message on this as well

## 2021-10-20 NOTE — Telephone Encounter (Signed)
See phone note

## 2021-10-20 NOTE — Telephone Encounter (Signed)
Ok to schedule lab for [facility] (dx estrogen def).  Need to schedule in 4-6 weeks.  I have sent her a my chart message.  Needs to use other protection until can determine if menopause.

## 2021-10-23 ENCOUNTER — Other Ambulatory Visit: Payer: Self-pay

## 2021-10-23 DIAGNOSIS — E2839 Other primary ovarian failure: Secondary | ICD-10-CM

## 2021-10-23 NOTE — Telephone Encounter (Signed)
[place] ordered

## 2021-10-23 NOTE — Telephone Encounter (Signed)
Pt is scheduled for [facility] lab for 11/21/21 at 10:30. Pt was also advised to use other protection until menopause can be determined. Pt gave verbal understanding.

## 2021-10-23 NOTE — Telephone Encounter (Signed)
LMTCB

## 2021-10-23 NOTE — Progress Notes (Signed)
Winner ordered.

## 2021-11-06 ENCOUNTER — Other Ambulatory Visit: Payer: Self-pay

## 2021-11-06 ENCOUNTER — Ambulatory Visit: Payer: 59 | Admitting: Dermatology

## 2021-11-06 ENCOUNTER — Encounter: Payer: Self-pay | Admitting: Dermatology

## 2021-11-06 DIAGNOSIS — Z872 Personal history of diseases of the skin and subcutaneous tissue: Secondary | ICD-10-CM

## 2021-11-06 DIAGNOSIS — L308 Other specified dermatitis: Secondary | ICD-10-CM

## 2021-11-06 NOTE — Progress Notes (Signed)
° °  Follow-Up Visit   Subjective  Paula Wallace is a 56 y.o. female who presents for the following: Rash (3 week recheck. Eczema on face. Opzelura cleared the rash on face. Has not been using Elidel. ).  The following portions of the chart were reviewed this encounter and updated as appropriate:  Tobacco   Allergies   Meds   Problems   Med Hx   Surg Hx   Fam Hx       Review of Systems: No other skin or systemic complaints except as noted in HPI or Assessment and Plan.   Objective  Well appearing patient in no apparent distress; mood and affect are within normal limits.  A focused examination was performed including face. Relevant physical exam findings are noted in the Assessment and Plan.  face Mild hyperpigmentation   Assessment & Plan  Other eczema face  Chronic condition with duration or expected duration over one year. Currently well-controlled.  Continue Opzelura daily as needed for recurrence.  Continue gentle skin care     Related Medications Ruxolitinib Phosphate (OPZELURA) 1.5 % CREA Apply 1 application topically daily. Apply to face daily  pimecrolimus (ELIDEL) 1 % cream Apply to face twice a day   History of Perioral dermatitis - clear today  Return in about 1 year (around 11/06/2022) for recheck face.  I, Emelia Salisbury, CMA, am acting as scribe for Forest Gleason, MD.  Documentation: I have reviewed the above documentation for accuracy and completeness, and I agree with the above.  Forest Gleason, MD

## 2021-11-06 NOTE — Patient Instructions (Addendum)
Gentle Skin Care Guide  1. Bathe no more than once a day.  2. Avoid bathing in hot water  3. Use a mild soap like Dove, Vanicream, Cetaphil, CeraVe. Can use Lever 2000 or Cetaphil antibacterial soap  4. Use soap only where you need it. On most days, use it under your arms, between your legs, and on your feet. Let the water rinse other areas unless visibly dirty.  5. When you get out of the bath/shower, use a towel to gently blot your skin dry, don't rub it.  6. While your skin is still a little damp, apply a moisturizing cream such as Vanicream, CeraVe, Cetaphil, Eucerin, Sarna lotion or plain Vaseline Jelly. For hands apply Neutrogena [place] Hand Cream or Excipial Hand Cream.  7. Reapply moisturizer any time you start to itch or feel dry.  8. Sometimes using free and clear laundry detergents can be helpful. Fabric softener sheets should be avoided. Downy Free & Gentle liquid, or any liquid fabric softener that is free of dyes and perfumes, it acceptable to use  9. If your doctor has given you prescription creams you may apply moisturizers over them   Some Recommended Sunscreens Include:  EltaMD UV Clear Black Girl Sunscreen La Roche Posay  La Roche Posay Anthelios Sunscreen, Ultra-Light Fluid Face Sunscreen  Face Sunscreen Available in Different Tints Colorescience Sunforgettable Total Protection Brush on Shield  bareMinerals Complexion Rescue Tinted Hydrating Gel Cream Broad Spectrum SPF 30 UnSun mineral tinted (comes in medium/dark and light/medium)   If You Need Anything After Your Visit  If you have any questions or concerns for your doctor, please call our main line at [phone] and press option 4 to reach your doctor's medical assistant. If no one answers, please leave a voicemail as directed and we will return your call as soon as possible. Messages left after 4 pm will be answered the following business day.   You may also send [place] a message via [place]. We  typically respond to MyChart messages within 1-2 business days.  For prescription refills, please ask your pharmacy to contact our office. Our fax number is [phone].  If you have an urgent issue when the clinic is closed that cannot wait until the next business day, you can page your doctor at the number below.    Please note that while we do our best to be available for urgent issues outside of office hours, we are not available 24/7.   If you have an urgent issue and are unable to reach [place], you may choose to seek medical care at your doctor's office, retail clinic, urgent care center, or emergency room.  If you have a medical emergency, please immediately call 911 or go to the emergency department.  Pager Numbers  - Dr. Nehemiah Massed: [phone]  - Dr. Laurence Ferrari: [phone]  - Dr. Nicole Kindred: [phone]  In the event of inclement weather, please call our main line at [phone] for an update on the status of any delays or closures.  Dermatology Medication Tips: Please keep the boxes that topical medications come in in order to help keep track of the instructions about where and how to use these. Pharmacies typically print the medication instructions only on the boxes and not directly on the medication tubes.   If your medication is too expensive, please contact our office at [phone] option 4 or send [place] a message through [place].   We are unable to tell what your co-pay for medications will be in advance as  this is different depending on your insurance coverage. However, we may be able to find a substitute medication at lower cost or fill out paperwork to get insurance to cover a needed medication.   If a prior authorization is required to get your medication covered by your insurance company, please allow [place] 1-2 business days to complete this process.  Drug prices often vary depending on where the prescription is filled and some pharmacies may offer cheaper prices.  The  website www.goodrx.com contains coupons for medications through different pharmacies. The prices here do not account for what the cost may be with help from insurance (it may be cheaper with your insurance), but the website can give you the price if you did not use any insurance.  - You can print the associated coupon and take it with your prescription to the pharmacy.  - You may also stop by our office during regular business hours and pick up a GoodRx coupon card.  - If you need your prescription sent electronically to a different pharmacy, notify our office through [facility] or by phone at [phone] option 4.     Si Usted Necesita Algo Despus de Su Visita  Tambin puede enviarnos un mensaje a travs de Pharmacist, community. Por lo general respondemos a los mensajes de MyChart en el transcurso de 1 a 2 das hbiles.  Para renovar recetas, por favor pida a su farmacia que se ponga en contacto con nuestra oficina. Harland Dingwall de fax es [place] [phone].  Si tiene un asunto urgente cuando la clnica est cerrada y que no puede esperar hasta el siguiente da hbil, puede llamar/localizar a su doctor(a) al nmero que aparece a continuacin.   Por favor, tenga en cuenta que aunque hacemos todo lo posible para estar disponibles para asuntos urgentes fuera del horario de [place], no estamos disponibles las 24 horas del da, los 7 das de la [place].   Si tiene un problema urgente y no puede comunicarse con nosotros, puede optar por buscar atencin mdica  en el consultorio de su doctor(a), en una clnica privada, en un centro de atencin urgente o en una sala de emergencias.  Si tiene Engineering geologist, por favor llame inmediatamente al 911 o vaya a la sala de emergencias.  Nmeros de bper  - Dr. Nehemiah Massed: [phone]  - Dra. Moye: [phone]  - Dra. Nicole Kindred: [phone]  En caso de inclemencias del [place], por favor llame a Johnsie Kindred principal al [phone] para una  actualizacin sobre el [place] de cualquier retraso o cierre.  Consejos para la medicacin en dermatologa: Por favor, guarde las cajas en las que vienen los medicamentos de uso tpico para ayudarle a seguir las instrucciones sobre dnde y cmo usarlos. Las farmacias generalmente imprimen las instrucciones del medicamento slo en las cajas y no directamente en los tubos del [place].   Si su medicamento es muy caro, por favor, pngase en contacto con Zigmund Daniel llamando al [phone] y presione la opcin 4 o envenos un mensaje a travs de Pharmacist, community.   No podemos decirle cul ser su copago por los medicamentos por adelantado ya que esto es diferente dependiendo de la cobertura de su seguro. Sin embargo, es posible que podamos encontrar un medicamento sustituto a Electrical engineer un formulario para que el seguro cubra el medicamento que se considera necesario.   Si se requiere una autorizacin previa para que su compaa de seguros [place] su medicamento, por favor permtanos de 1 a 2 das hbiles  para Education officer, community.  Los precios de los medicamentos varan con frecuencia dependiendo del Environmental consultant de dnde se surte la receta y alguna farmacias pueden ofrecer precios ms baratos.  El sitio web www.goodrx.com tiene cupones para medicamentos de Airline pilot. Los precios aqu no tienen en cuenta lo que podra costar con la ayuda del seguro (puede ser ms barato con su seguro), pero el sitio web puede darle el precio si no utiliz Research scientist (physical sciences).  - Puede imprimir el cupn correspondiente y llevarlo con su receta a la farmacia.  - Tambin puede pasar por nuestra oficina durante el horario de atencin regular y Charity fundraiser una tarjeta de cupones de GoodRx.  - Si necesita que su receta se enve electrnicamente a una farmacia diferente, informe a nuestra oficina a travs de MyChart de [place] o por telfono llamando al [phone] y presione la opcin 4.

## 2021-11-09 ENCOUNTER — Other Ambulatory Visit: Payer: Self-pay

## 2021-11-11 ENCOUNTER — Encounter: Payer: Self-pay | Admitting: Dermatology

## 2021-11-13 ENCOUNTER — Ambulatory Visit: Payer: 59

## 2021-11-15 ENCOUNTER — Ambulatory Visit: Payer: 59 | Admitting: Internal Medicine

## 2021-11-20 DIAGNOSIS — Z20822 Contact with and (suspected) exposure to covid-19: Secondary | ICD-10-CM | POA: Diagnosis not present

## 2021-11-20 DIAGNOSIS — J988 Other specified respiratory disorders: Secondary | ICD-10-CM | POA: Diagnosis not present

## 2021-11-21 ENCOUNTER — Other Ambulatory Visit: Payer: Self-pay

## 2021-11-21 ENCOUNTER — Other Ambulatory Visit (INDEPENDENT_AMBULATORY_CARE_PROVIDER_SITE_OTHER): Payer: 59

## 2021-11-21 DIAGNOSIS — E2839 Other primary ovarian failure: Secondary | ICD-10-CM | POA: Diagnosis not present

## 2021-11-21 LAB — FOLLICLE STIMULATING HORMONE: FSH: 63.3 m[IU]/mL

## 2021-11-22 ENCOUNTER — Telehealth: Payer: Self-pay

## 2021-11-22 NOTE — Telephone Encounter (Signed)
LM regarding labs °

## 2021-11-22 NOTE — Telephone Encounter (Signed)
Patient was advised of lab results. Patient gave verbal understanding and stated she has had her nexplanon removed and has not had any bleeding. Patient will call back if she has any questions.

## 2021-11-22 NOTE — Telephone Encounter (Signed)
-----   Message from Einar Pheasant, MD sent at 11/22/2021  4:53 AM EST ----- [name] that her [facility] does appear to be in the menopausal range.  She recently had nexplanon removed.  Confirm no bleeding or menstrual period.

## 2021-11-23 NOTE — Telephone Encounter (Signed)
See result note.  

## 2021-11-29 NOTE — Telephone Encounter (Signed)
Patient would like to speak to [place] about her lab results from last week.

## 2021-11-30 ENCOUNTER — Encounter: Payer: Self-pay | Admitting: Internal Medicine

## 2021-11-30 NOTE — Telephone Encounter (Signed)
Went over results with her again and notified that she is in menopausal range and confirmed no period no bleeding since nexplanon removal. She should not need to consider birth control, correct?

## 2021-11-30 NOTE — Telephone Encounter (Signed)
See mychart message. Discussed with patient,

## 2021-12-01 NOTE — Telephone Encounter (Signed)
Please call her and confirm when had nexplanon removed.  FSH is c/w menopause level.  Can forward labs to her gyn (who removed nexplanon) and confirm nothing more needed.

## 2021-12-04 NOTE — Telephone Encounter (Signed)
Spoke with patient. Patient stated labs do not need to be forwarded. She went to the health dept to have removed. She stated nothing further is needed at this time and will let me know if something changes.

## 2021-12-11 ENCOUNTER — Other Ambulatory Visit: Payer: Self-pay

## 2021-12-18 ENCOUNTER — Encounter: Payer: Self-pay | Admitting: Internal Medicine

## 2021-12-18 ENCOUNTER — Other Ambulatory Visit: Payer: Self-pay

## 2021-12-18 ENCOUNTER — Telehealth (INDEPENDENT_AMBULATORY_CARE_PROVIDER_SITE_OTHER): Payer: 59 | Admitting: Internal Medicine

## 2021-12-18 DIAGNOSIS — R87610 Atypical squamous cells of undetermined significance on cytologic smear of cervix (ASC-US): Secondary | ICD-10-CM

## 2021-12-18 DIAGNOSIS — R739 Hyperglycemia, unspecified: Secondary | ICD-10-CM | POA: Diagnosis not present

## 2021-12-18 DIAGNOSIS — Z720 Tobacco use: Secondary | ICD-10-CM | POA: Diagnosis not present

## 2021-12-18 DIAGNOSIS — F439 Reaction to severe stress, unspecified: Secondary | ICD-10-CM | POA: Diagnosis not present

## 2021-12-18 DIAGNOSIS — Z122 Encounter for screening for malignant neoplasm of respiratory organs: Secondary | ICD-10-CM

## 2021-12-18 DIAGNOSIS — E7800 Pure hypercholesterolemia, unspecified: Secondary | ICD-10-CM | POA: Diagnosis not present

## 2021-12-18 DIAGNOSIS — I10 Essential (primary) hypertension: Secondary | ICD-10-CM | POA: Diagnosis not present

## 2021-12-18 DIAGNOSIS — E0789 Other specified disorders of thyroid: Secondary | ICD-10-CM | POA: Diagnosis not present

## 2021-12-18 NOTE — Progress Notes (Signed)
[name], female   DOB: April 15, 1966, 56 y.o.   MRN: [id number]   Virtual Visit via video Note  This visit type was conducted due to national recommendations for restrictions regarding the COVID-19 pandemic (e.g. social distancing).  This format is felt to be most appropriate for this patient at this time.  All issues noted in this document were discussed and addressed.  No physical exam was performed (except for noted visual exam findings with Video Visits).   I connected with Paula Wallace by a video enabled telemedicine application and verified that I am speaking with the correct person using two identifiers. Location patient: home Location provider: work  Persons participating in the virtual visit: patient, provider  The limitations, risks, security and privacy concerns of performing an evaluation and management service by video and the availability of in person appointments have been discussed.  It has also been discussed with the patient that there may be a patient responsible charge related to this service. The patient expressed understanding and agreed to proceed.   Reason for visit: follow up appt  HPI: Follow up regarding hypertension, increased cholesterol and smoking cessation.  Recently had nexplanon removed.  Has been experiencing hot flashes.  Affecting her sleep - some.  Takes hydroxyzine prn.  Rarely takes.  No chest pain or sob reported.  No increased cough or congestion reported.  No acid reflux or abdominal pain reported.  Bowels stable.  Is not going to the gym as much due to helping a friend who had a stroke.  Plans to get back in the gym.  Still smoking.  Discussed quitting.  States does not smoke as much when exercising regularly.     ROS: See pertinent positives and negatives per HPI.  Past Medical History:  Diagnosis Date   Hyperlipidemia    Hypertension     Past Surgical History:  Procedure Laterality Date   VAGINAL DELIVERY  1989    episiotomy, Dr. Arsenio Loader    Family History  Problem Relation Age of Onset   Diabetes Mother    Dementia Mother    Stroke Father 51   Hypertension Sister    Thyroid disease Sister    Diabetes Sister    Hypertension Brother    Diabetes Brother    Hypertension Sister    Hypertension Brother    Hypertension Brother    Hypertension Brother    Hypertension Sister    Breast cancer Neg Hx    Lupus Neg Hx     SOCIAL HX: reviewed.    Current Outpatient Medications:    amLODipine (NORVASC) 2.5 MG tablet, Take 1 tablet (2.5 mg total) by mouth daily., Disp: 90 tablet, Rfl: 1   buPROPion (WELLBUTRIN XL) 150 MG 24 hr tablet, Take 1 tablet (150 mg total) by mouth 2 (two) times daily., Disp: 180 tablet, Rfl: 1   hydrochlorothiazide (HYDRODIURIL) 25 MG tablet, TAKE 1 TABLET BY MOUTH DAILY, Disp: 90 tablet, Rfl: 2   Multiple Vitamin (MULTIVITAMIN WITH MINERALS) TABS tablet, Take 1 tablet by mouth daily., Disp: , Rfl:    Omega-3 1000 MG CAPS, Take 1 capsule by mouth daily., Disp: , Rfl:    pravastatin (PRAVACHOL) 10 MG tablet, Take 1 tablet (10 mg total) by mouth daily., Disp: 90 tablet, Rfl: 1   Ruxolitinib Phosphate (OPZELURA) 1.5 % CREA, Apply 1 application topically daily. Apply to face daily, Disp: 60 g, Rfl: 2  EXAM:  GENERAL: alert, oriented, appears well and in no acute distress  HEENT:  atraumatic, conjunttiva clear, no obvious abnormalities on inspection of external nose and ears  NECK: normal movements of the head and neck  LUNGS: on inspection no signs of respiratory distress, breathing rate appears normal, no obvious gross SOB, gasping or wheezing  CV: no obvious cyanosis  PSYCH/NEURO: pleasant and cooperative, no obvious depression or anxiety, speech and thought processing grossly intact  ASSESSMENT AND PLAN:  Discussed the following assessment and plan:  Problem List Items Addressed This Visit     Abnormal Pap smear of cervix    Last pap  - ASCUS.  Repeat pap at next  appt.        Hypercholesterolemia    On pravastatin.  Low cholesterol diet and exercise.  Follow lipid panel.        Hyperglycemia    Low carb diet and exercise.  Follow met b and a1c.       Hypertension    On hctz and amlodipine. Follow metabolic panel.       Stress    On wellbutrin.  Overall appears to be handling things relatively well.  Has good support.  Follow.       Thyroid fullness    Previously evaluated by Dr Eddie Dibbles.  Was previously scheduled for f/u thyroid ultrasound.  Cancelled due to financial issues.  Will plan for f/u with endocrinology. See if agreeable.       Tobacco use    Continues to smoke. Continue wellbutrin.  Discussed screening chest CT.  She is agreeable.        Relevant Orders   Ambulatory Referral Lung Cancer Screening [facility]   Other Visit Diagnoses     Screening for lung cancer    -  Primary   Relevant Orders   Ambulatory Referral Lung Cancer Screening [facility]       Return in about 4 months (around 04/17/2022) for follow up appt (with pap).   I discussed the assessment and treatment plan with the patient. The patient was provided an opportunity to ask questions and all were answered. The patient agreed with the plan and demonstrated an understanding of the instructions.   The patient was advised to call back or seek an in-person evaluation if the symptoms worsen or if the condition fails to improve as anticipated.   Einar Pheasant, MD

## 2021-12-24 ENCOUNTER — Encounter: Payer: Self-pay | Admitting: Internal Medicine

## 2021-12-24 ENCOUNTER — Telehealth: Payer: Self-pay | Admitting: Internal Medicine

## 2021-12-24 DIAGNOSIS — R87619 Unspecified abnormal cytological findings in specimens from cervix uteri: Secondary | ICD-10-CM | POA: Insufficient documentation

## 2021-12-24 NOTE — Assessment & Plan Note (Signed)
On pravastatin.  Low cholesterol diet and exercise.  Follow lipid panel.   

## 2021-12-24 NOTE — Assessment & Plan Note (Signed)
Low carb diet and exercise.  Follow met b and a1c.  ?

## 2021-12-24 NOTE — Assessment & Plan Note (Signed)
Previously evaluated by Dr Eddie Dibbles.  Was previously scheduled for f/u thyroid ultrasound.  Cancelled due to financial issues.  Will plan for f/u with endocrinology. See if agreeable.  ?

## 2021-12-24 NOTE — Assessment & Plan Note (Signed)
Last pap  - ASCUS.  Repeat pap at next appt.   ?

## 2021-12-24 NOTE — Telephone Encounter (Signed)
Please notify Kennyth Lose that I have placed an order for amb referral for screening chest CT.  Someone should be calling her about an appt.  Let us know if not scheduled.  Also, needs a f/u appt with me in 4 months (f/u and pap).  Needs fasting lab appt in 6 weeks.  Also, in reviewing chart, she previously saw Dr Eddie Dibbles for her thyroid.  Had recommended f/u and ultrasound. Had previously wanted to hold on f/u.   Is she agreeable for f/u now?   ?

## 2021-12-24 NOTE — Assessment & Plan Note (Signed)
Continues to smoke. Continue wellbutrin.  Discussed screening chest CT.  She is agreeable.   ?

## 2021-12-24 NOTE — Assessment & Plan Note (Signed)
On wellbutrin.  Overall appears to be handling things relatively well.  Has good support.  Follow.  

## 2021-12-24 NOTE — Assessment & Plan Note (Addendum)
On hctz and amlodipine. Follow metabolic panel.  ?

## 2021-12-28 NOTE — Telephone Encounter (Signed)
LMTCB

## 2022-01-03 ENCOUNTER — Other Ambulatory Visit: Payer: Self-pay | Admitting: Internal Medicine

## 2022-01-03 ENCOUNTER — Other Ambulatory Visit: Payer: Self-pay

## 2022-01-04 ENCOUNTER — Other Ambulatory Visit: Payer: Self-pay

## 2022-01-04 MED ORDER — PRAVASTATIN SODIUM 10 MG PO TABS
10.0000 mg | ORAL_TABLET | Freq: Every day | ORAL | 1 refills | Status: DC
  Filled 2022-01-04: qty 90, 90d supply, fill #0

## 2022-01-09 NOTE — Telephone Encounter (Signed)
Gave patient number to schedule chest CT. Has not heard. Scheduled fasting labs and 69mfollow up. Noted pap is needed. Would like to hold off on thyroid f/u at this time. ?

## 2022-01-23 ENCOUNTER — Other Ambulatory Visit: Payer: Self-pay

## 2022-01-23 DIAGNOSIS — Z122 Encounter for screening for malignant neoplasm of respiratory organs: Secondary | ICD-10-CM

## 2022-01-23 DIAGNOSIS — F17210 Nicotine dependence, cigarettes, uncomplicated: Secondary | ICD-10-CM

## 2022-01-23 DIAGNOSIS — Z87891 Personal history of nicotine dependence: Secondary | ICD-10-CM

## 2022-02-06 ENCOUNTER — Encounter: Payer: Self-pay | Admitting: Acute Care

## 2022-02-06 ENCOUNTER — Ambulatory Visit (INDEPENDENT_AMBULATORY_CARE_PROVIDER_SITE_OTHER): Payer: 59 | Admitting: Acute Care

## 2022-02-06 DIAGNOSIS — F17210 Nicotine dependence, cigarettes, uncomplicated: Secondary | ICD-10-CM | POA: Diagnosis not present

## 2022-02-06 NOTE — Patient Instructions (Signed)
Thank you for participating in the [facility] Scre[facility]. It was our pleasure to meet you today. We will call you with the results of your scan within the next few days. Your scan will be assigned a Lung RADS category score by the physicians reading the scans.  This Lung RADS score determines follow up scanning.  See below for description of categories, and follow up screening recommendations. We will be in touch to schedule your follow up screening annually or based on recommendations of our providers. We will fax a copy of your scan results to your Primary Care Physician, or the physician who referred you to the program, to ensure they have the results. Please call the office if you have any questions or concerns regarding your scanning experience or results.  Our office number is [phone]. Please speak with Denise Phelps, RN. , or  Denise Buckner RN, They are  our Lung Cancer Screening RN.'s If They are unavailable when you call, Please leave a message on the voice mail. We will return your call at our earliest convenience.This voice mail is monitored several times a day.  Remember, if your scan is normal, we will scan you annually as long as you continue to meet the criteria for the program. (Age 55-77, Current smoker or smoker who has quit within the last 15 years). If you are a smoker, remember, quitting is the single most powerful action that you can take to decrease your risk of lung cancer and other pulmonary, breathing related problems. We know quitting is hard, and we are here to help.  Please let us know if there is anything we can do to help you meet your goal of quitting. If you are a former smoker, congratulations. We are proud of you! Remain smoke free! Remember you can refer friends or family members through the number above.  We will screen them to make sure they meet criteria for the program. Thank you for helping us take better care of you by  participating in Lung Screening.  You can receive free nicotine replacement therapy ( patches, gum or mints) by calling 1-800-QUIT NOW. Please call so we can get you on the path to becoming  a non-smoker. I know it is hard, but you can do this!  Lung RADS Categories:  Lung RADS 1: no nodules or definitely non-concerning nodules.  Recommendation is for a repeat annual scan in 12 months.  Lung RADS 2:  nodules that are non-concerning in appearance and behavior with a very low likelihood of becoming an active cancer. Recommendation is for a repeat annual scan in 12 months.  Lung RADS 3: nodules that are probably non-concerning , includes nodules with a low likelihood of becoming an active cancer.  Recommendation is for a 6-month repeat screening scan. Often noted after an upper respiratory illness. We will be in touch to make sure you have no questions, and to schedule your 6-month scan.  Lung RADS 4 A: nodules with concerning findings, recommendation is most often for a follow up scan in 3 months or additional testing based on our provider's assessment of the scan. We will be in touch to make sure you have no questions and to schedule the recommended 3 month follow up scan.  Lung RADS 4 B:  indicates findings that are concerning. We will be in touch with you to schedule additional diagnostic testing based on our provider's  assessment of the scan.  Other options for assistance in smoking cessation (   As covered by your insurance benefits)  Hypnosis for smoking cessation  [employer] [phone]  Acupuncture for smoking cessation  [facility] [phone]   

## 2022-02-06 NOTE — Progress Notes (Signed)
Virtual Visit via Telephone Note ? ?I connected with Paula Wallace on 02/06/22 at 10:30 AM EDT by telephone and verified that I am speaking with the correct person using two identifiers. ? ?Location: ?Patient:  At home ?Provider:  [place], [place], [place], [address]  ?  ?I discussed the limitations, risks, security and privacy concerns of performing an evaluation and management service by telephone and the availability of in person appointments. I also discussed with the patient that there may be a patient responsible charge related to this service. The patient expressed understanding and agreed to proceed. ? ? ? ?Shared Decision Making Visit Lung Cancer Screening Program ?([phone]) ? ? ?Eligibility: ?Age 56 y.o. ?Pack Years Smoking History Calculation 28 pack year smoking history ?(# packs/per year x # years smoked) ?Recent History of coughing up blood  no ?Unexplained weight loss? no ?( >Than 15 pounds within the last 6 months ) ?Prior History Lung / other cancer no ?(Diagnosis within the last 5 years already requiring surveillance chest CT Scans). ?Smoking Status Current Smoker ?Former Smokers: Years since quit:  NA ? Quit Date:  NA ? ?Visit Components: ?Discussion included one or more decision making aids. yes ?Discussion included risk/benefits of screening. yes ?Discussion included potential follow up diagnostic testing for abnormal scans. yes ?Discussion included meaning and risk of over diagnosis. yes ?Discussion included meaning and risk of False Positives. yes ?Discussion included meaning of total radiation exposure. yes ? ?Counseling Included: ?Importance of adherence to annual lung cancer LDCT screening. yes ?Impact of comorbidities on ability to participate in the program. yes ?Ability and willingness to under diagnostic treatment. yes ? ?Smoking Cessation Counseling: ?Current Smokers:  ?Discussed importance of smoking cessation. yes ?Information about tobacco cessation classes and  interventions provided to patient. yes ?Patient provided with "ticket" for LDCT Scan. yes ?Symptomatic Patient. no ? Counseling NA ?Diagnosis Code: Tobacco Use Z72.0 ?Asymptomatic Patient yes ? Counseling (Intermediate counseling: > three minutes counseling) J2426 ?Former Smokers:  ?Discussed the importance of maintaining cigarette abstinence. yes ?Diagnosis Code: Personal History of Nicotine Dependence. S34.196 ?Information about tobacco cessation classes and interventions provided to patient. Yes ?Patient provided with "ticket" for LDCT Scan. yes ?Written Order for Lung Cancer Screening with LDCT placed in Epic. Yes ?(CT Chest Lung Cancer Screening Low Dose W/O CM) [id number] ?Z12.2-Screening of respiratory organs ?Z87.891-Personal history of nicotine dependence ? ?I have spent 25 minutes of face to face/ virtual visit   time with  Paula Wallace discussing the risks and benefits of lung cancer screening. We viewed / discussed a power point together that explained in detail the above noted topics. We paused at intervals to allow for questions to be asked and answered to ensure understanding.We discussed that the single most powerful action that she can take to decrease her risk of developing lung cancer is to quit smoking. We discussed whether or not she is ready to commit to setting a quit date. We discussed options for tools to aid in quitting smoking including nicotine replacement therapy, non-nicotine medications, support groups, Quit Smart classes, and behavior modification. We discussed that often times setting smaller, more achievable goals, such as eliminating 1 cigarette a day for a week and then 2 cigarettes a day for a week can be helpful in slowly decreasing the number of cigarettes smoked. This allows for a sense of accomplishment as well as providing a clinical benefit. I provided  her  with smoking cessation  information  with contact information for community resources, classes,  free nicotine  replacement therapy, and access to mobile apps, text messaging, and on-line smoking cessation help. I have also provided  her  the office contact information in the event she needs to contact me, or the screening staff. We discussed the time and location of the scan, and that either Doroteo Glassman RN, Joella Prince, RN  or I will call / send a letter with the results within 24-72 hours of receiving them. The patient verbalized understanding of all of  the above and had no further questions upon leaving the office. They have my contact information in the event they have any further questions. ? ?I spent 3 minutes counseling on smoking cessation and the health risks of continued tobacco abuse. ? ?I explained to the patient that there has been a high incidence of coronary artery disease noted on these exams. I explained that this is a non-gated exam therefore degree or severity cannot be determined. This patient is on statin therapy. I have asked the patient to follow-up with their PCP regarding any incidental finding of coronary artery disease and management with diet or medication as their PCP  feels is clinically indicated. The patient verbalized understanding of the above and had no further questions upon completion of the visit. ? ?  ? ? ?Magdalen Spatz, NP ?02/06/2022 ? ? ? ? ? ? ?

## 2022-02-07 ENCOUNTER — Ambulatory Visit
Admission: RE | Admit: 2022-02-07 | Discharge: 2022-02-07 | Disposition: A | Discharge status: Home / Self Care | Payer: 59 | Source: Ambulatory Visit | Attending: Acute Care | Admitting: Acute Care

## 2022-02-07 ENCOUNTER — Other Ambulatory Visit: Payer: Self-pay

## 2022-02-07 DIAGNOSIS — I700 Atherosclerosis of aorta: Secondary | ICD-10-CM | POA: Diagnosis not present

## 2022-02-07 DIAGNOSIS — F17210 Nicotine dependence, cigarettes, uncomplicated: Secondary | ICD-10-CM | POA: Diagnosis not present

## 2022-02-07 DIAGNOSIS — Z87891 Personal history of nicotine dependence: Secondary | ICD-10-CM

## 2022-02-07 DIAGNOSIS — Z122 Encounter for screening for malignant neoplasm of respiratory organs: Secondary | ICD-10-CM | POA: Insufficient documentation

## 2022-02-07 DIAGNOSIS — J432 Centrilobular emphysema: Secondary | ICD-10-CM | POA: Diagnosis not present

## 2022-02-11 ENCOUNTER — Other Ambulatory Visit: Payer: Self-pay

## 2022-02-11 DIAGNOSIS — F17210 Nicotine dependence, cigarettes, uncomplicated: Secondary | ICD-10-CM

## 2022-02-11 DIAGNOSIS — Z122 Encounter for screening for malignant neoplasm of respiratory organs: Secondary | ICD-10-CM

## 2022-02-11 DIAGNOSIS — Z87891 Personal history of nicotine dependence: Secondary | ICD-10-CM

## 2022-02-12 ENCOUNTER — Encounter: Payer: Self-pay | Admitting: Internal Medicine

## 2022-02-12 DIAGNOSIS — I700 Atherosclerosis of aorta: Secondary | ICD-10-CM | POA: Insufficient documentation

## 2022-02-12 DIAGNOSIS — E279 Disorder of adrenal gland, unspecified: Secondary | ICD-10-CM | POA: Insufficient documentation

## 2022-02-19 ENCOUNTER — Telehealth: Payer: Self-pay | Admitting: *Deleted

## 2022-02-19 DIAGNOSIS — E7800 Pure hypercholesterolemia, unspecified: Secondary | ICD-10-CM

## 2022-02-19 DIAGNOSIS — R739 Hyperglycemia, unspecified: Secondary | ICD-10-CM

## 2022-02-19 DIAGNOSIS — I10 Essential (primary) hypertension: Secondary | ICD-10-CM

## 2022-02-19 DIAGNOSIS — E0789 Other specified disorders of thyroid: Secondary | ICD-10-CM

## 2022-02-19 NOTE — Telephone Encounter (Signed)
Please place future orders for lab appt.  Pt has lab appt at 9:30 on Wed 02/20/22 ?

## 2022-02-20 ENCOUNTER — Other Ambulatory Visit (INDEPENDENT_AMBULATORY_CARE_PROVIDER_SITE_OTHER): Payer: 59

## 2022-02-20 DIAGNOSIS — E7800 Pure hypercholesterolemia, unspecified: Secondary | ICD-10-CM | POA: Diagnosis not present

## 2022-02-20 DIAGNOSIS — I10 Essential (primary) hypertension: Secondary | ICD-10-CM | POA: Diagnosis not present

## 2022-02-20 DIAGNOSIS — R739 Hyperglycemia, unspecified: Secondary | ICD-10-CM

## 2022-02-20 LAB — CBC WITH DIFFERENTIAL/PLATELET
Basophils Absolute: 0 10*3/uL (ref 0.0–0.1)
Basophils Relative: 0.3 % (ref 0.0–3.0)
Eosinophils Absolute: 0.2 10*3/uL (ref 0.0–0.7)
Eosinophils Relative: 3.1 % (ref 0.0–5.0)
HCT: 40.8 % (ref 36.0–46.0)
Hemoglobin: 13.8 g/dL (ref 12.0–15.0)
Lymphocytes Relative: 35 % (ref 12.0–46.0)
Lymphs Abs: 1.9 10*3/uL (ref 0.7–4.0)
MCHC: 33.7 g/dL (ref 30.0–36.0)
MCV: 91.5 fl (ref 78.0–100.0)
Monocytes Absolute: 0.4 10*3/uL (ref 0.1–1.0)
Monocytes Relative: 7.6 % (ref 3.0–12.0)
Neutro Abs: 2.9 10*3/uL (ref 1.4–7.7)
Neutrophils Relative %: 54 % (ref 43.0–77.0)
Platelets: 231 10*3/uL (ref 150.0–400.0)
RBC: 4.46 Mil/uL (ref 3.87–5.11)
RDW: 13.2 % (ref 11.5–15.5)
WBC: 5.4 10*3/uL (ref 4.0–10.5)

## 2022-02-20 LAB — BASIC METABOLIC PANEL
BUN: 10 mg/dL (ref 6–23)
CO2: 26 mEq/L (ref 19–32)
Calcium: 9.7 mg/dL (ref 8.4–10.5)
Chloride: 102 mEq/L (ref 96–112)
Creatinine, Ser: 0.69 mg/dL (ref 0.40–1.20)
GFR: 98.12 mL/min (ref >60.00)
Glucose, Bld: 116 mg/dL — ABNORMAL HIGH (ref 70–99)
Potassium: 4.1 mEq/L (ref 3.5–5.1)
Sodium: 138 mEq/L (ref 135–145)

## 2022-02-20 LAB — LIPID PANEL
Cholesterol: 212 mg/dL — ABNORMAL HIGH (ref 0–200)
HDL: 73 mg/dL (ref >39.00)
LDL Cholesterol: 125 mg/dL — ABNORMAL HIGH (ref 0–99)
NonHDL: 139.12
Total CHOL/HDL Ratio: 3
Triglycerides: 73 mg/dL (ref 0.0–149.0)
VLDL: 14.6 mg/dL (ref 0.0–40.0)

## 2022-02-20 LAB — HEPATIC FUNCTION PANEL
ALT: 24 U/L (ref 0–35)
AST: 20 U/L (ref 0–37)
Albumin: 4.6 g/dL (ref 3.5–5.2)
Alkaline Phosphatase: 77 U/L (ref 39–117)
Bilirubin, Direct: 0.1 mg/dL (ref 0.0–0.3)
Total Bilirubin: 0.5 mg/dL (ref 0.2–1.2)
Total Protein: 7 g/dL (ref 6.0–8.3)

## 2022-02-20 LAB — TSH: TSH: 0.44 u[IU]/mL (ref 0.35–5.50)

## 2022-02-20 LAB — HEMOGLOBIN A1C: Hgb A1c MFr Bld: 6 % (ref 4.6–6.5)

## 2022-02-20 NOTE — Telephone Encounter (Signed)
Orders placed for labs

## 2022-02-26 ENCOUNTER — Other Ambulatory Visit: Payer: Self-pay

## 2022-02-26 MED ORDER — PRAVASTATIN SODIUM 20 MG PO TABS
20.0000 mg | ORAL_TABLET | Freq: Every day | ORAL | 3 refills | Status: DC
  Filled 2022-02-26 – 2022-03-25 (×2): qty 90, 90d supply, fill #0
  Filled 2022-06-19: qty 90, 90d supply, fill #1
  Filled 2022-09-19: qty 90, 90d supply, fill #2

## 2022-03-14 ENCOUNTER — Other Ambulatory Visit: Payer: Self-pay

## 2022-03-25 ENCOUNTER — Other Ambulatory Visit: Payer: Self-pay | Admitting: Internal Medicine

## 2022-03-25 ENCOUNTER — Other Ambulatory Visit: Payer: Self-pay

## 2022-03-25 ENCOUNTER — Ambulatory Visit: Payer: 59 | Admitting: Family

## 2022-03-25 ENCOUNTER — Encounter: Payer: Self-pay | Admitting: Family

## 2022-03-25 VITALS — BP 138/70 | HR 94 | Temp 98.5°F | Ht 71.0 in | Wt 248.8 lb

## 2022-03-25 DIAGNOSIS — R300 Dysuria: Secondary | ICD-10-CM | POA: Diagnosis not present

## 2022-03-25 DIAGNOSIS — N950 Postmenopausal bleeding: Secondary | ICD-10-CM | POA: Diagnosis not present

## 2022-03-25 LAB — POCT URINALYSIS DIPSTICK
Bilirubin, UA: NEGATIVE
Glucose, UA: NEGATIVE
Ketones, UA: NEGATIVE
Nitrite, UA: NEGATIVE
Protein, UA: POSITIVE — AB
Spec Grav, UA: >=1.03 — AB (ref 1.010–1.025)
Urobilinogen, UA: 1 E.U./dL
pH, UA: 6.5 (ref 5.0–8.0)

## 2022-03-25 LAB — URINALYSIS, ROUTINE W REFLEX MICROSCOPIC
Bilirubin Urine: NEGATIVE
Ketones, ur: NEGATIVE
Nitrite: POSITIVE — AB
Specific Gravity, Urine: 1.025 (ref 1.000–1.030)
Total Protein, Urine: >=300 — AB
Urine Glucose: NEGATIVE
Urobilinogen, UA: 1 (ref 0.0–1.0)
pH: 7 (ref 5.0–8.0)

## 2022-03-25 MED ORDER — HYDROCHLOROTHIAZIDE 25 MG PO TABS
ORAL_TABLET | Freq: Every day | ORAL | 2 refills | Status: DC
  Filled 2022-03-25: qty 90, 90d supply, fill #0

## 2022-03-25 MED ORDER — NITROFURANTOIN MONOHYD MACRO 100 MG PO CAPS
100.0000 mg | ORAL_CAPSULE | Freq: Two times a day (BID) | ORAL | 0 refills | Status: DC
  Filled 2022-03-25: qty 10, 5d supply, fill #0

## 2022-03-25 MED ORDER — HYDROCHLOROTHIAZIDE 25 MG PO TABS
ORAL_TABLET | Freq: Every day | ORAL | 2 refills | Status: DC
  Filled 2022-03-25: qty 90, fill #0
  Filled 2022-06-19: qty 90, 90d supply, fill #0
  Filled 2022-09-19: qty 90, 90d supply, fill #1
  Filled 2023-01-03: qty 90, 90d supply, fill #2

## 2022-03-25 NOTE — Assessment & Plan Note (Signed)
UA POC is  positive for blood, small leukocytes.  Negative nitrites.  In the setting of chills, I recommended patient go ahead and start Macrobid head of urine culture due to concern for UTI.  Will await urine culture.  She will let me know how she is doing

## 2022-03-25 NOTE — Assessment & Plan Note (Signed)
Reassuring pelvic exam today with no evidence of cervical or vaginal bleeding.  Pap smear is up-to-date.  Paula Wallace 64.  Concern for postmenopausal bleeding. I question if related to previous birth control use including Depo-Provera and the Nexplanon which was removed 6 months ago.  I ordered ultrasound transvaginal and also placed referral to GYN further evaluation.

## 2022-03-25 NOTE — Patient Instructions (Signed)
Start Macrobid. Ensure to take probiotics while on antibiotics and also for 2 weeks after completion. This can either be by eating yogurt daily or taking a probiotic supplement over the counter such as Culturelle.It is important to re-colonize the gut with good bacteria and also to prevent any diarrheal infections associated with antibiotic use.   Referral for ultrasound of the pelvis and to GYN Let us know if you dont hear back within a week in regards to an appointment being scheduled.   Let me know how you are doing

## 2022-03-25 NOTE — Progress Notes (Signed)
Subjective:    [name], female    DOB: 1966-08-25, 56 y.o.   MRN: [id number]  CC: Paula Wallace is a 56 y.o. female who presents today for an acute visit.    HPI: Complains of urinary frequency, hematuria, and urinary pressure x 1 day , unchanged Endorses chills No fever, abdominal pain.   She has nexplanon removed [facility] 10/17/21. Prior to had been on Depo. She is not on birth control at this time.   She reports vaginal bleeding 'teeny tiny' from the vagina. Sexually active. No concern for STDs.   No dyspareunia.   No h/o fibroids .      Pap Obtained 07/30/2021 shows atypical squamous cells of undetermined significance, HPV is negative.    Current smoker No history of renal stone   HISTORY:  Past Medical History:  Diagnosis Date   Hyperlipidemia    Hypertension    Past Surgical History:  Procedure Laterality Date   VAGINAL DELIVERY  1989   episiotomy, Dr. Arsenio Loader   Family History  Problem Relation Age of Onset   Diabetes Mother    Dementia Mother    Stroke Father 1   Hypertension Sister    Thyroid disease Sister    Diabetes Sister    Hypertension Brother    Diabetes Brother    Hypertension Sister    Hypertension Brother    Hypertension Brother    Hypertension Brother    Hypertension Sister    Breast cancer Neg Hx    Lupus Neg Hx     Allergies: Keflex [cephalexin] and Mupirocin Current Outpatient Medications on File Prior to Visit  Medication Sig Dispense Refill   amLODipine (NORVASC) 2.5 MG tablet Take 1 tablet (2.5 mg total) by mouth daily. 90 tablet 1   buPROPion (WELLBUTRIN XL) 150 MG 24 hr tablet Take 1 tablet (150 mg total) by mouth 2 (two) times daily. 180 tablet 1   Multiple Vitamin (MULTIVITAMIN WITH MINERALS) TABS tablet Take 1 tablet by mouth daily.     Omega-3 1000 MG CAPS Take 1 capsule by mouth daily.     pravastatin (PRAVACHOL) 20 MG tablet Take 1 tablet (20 mg total) by mouth daily. 90 tablet  3   Ruxolitinib Phosphate (OPZELURA) 1.5 % CREA Apply 1 application topically daily. Apply to face daily 60 g 2   No current facility-administered medications on file prior to visit.    Social History   Tobacco Use   Smoking status: Some Days    Packs/day: 0.50    Years: 15.00    Pack years: 7.50    Types: Cigarettes   Smokeless tobacco: Never  Vaping Use   Vaping Use: Never used  Substance Use Topics   Alcohol use: Yes    Alcohol/week: 0.0 standard drinks    Comment: 2-3 times a week per patient   Drug use: No    Review of Systems  Constitutional:  Negative for chills and fever.  Respiratory:  Negative for cough.   Cardiovascular:  Negative for chest pain and palpitations.  Gastrointestinal:  Negative for nausea and vomiting.  Genitourinary:  Positive for dysuria and vaginal bleeding. Negative for difficulty urinating, dyspareunia and pelvic pain.     Objective:    BP 138/70   Pulse 94   Temp 98.5 F (36.9 C)   Ht '5\' 11"'$  (1.803 m)   Wt 248 lb 12.8 oz (112.9 kg)   SpO2 98%   BMI 34.70 kg/m    Physical Exam  Vitals reviewed.  Constitutional:      Appearance: She is well-developed.  Cardiovascular:     Rate and Rhythm: Normal rate and regular rhythm.     Pulses: Normal pulses.     Heart sounds: Normal heart sounds.  Pulmonary:     Effort: Pulmonary effort is normal.     Breath sounds: Normal breath sounds. No wheezing, rhonchi or rales.  Genitourinary:    Cervix: No cervical bleeding.     Comments: Internal exam performed.  No vaginal bleeding or blood seen from cervical os. Skin:    General: Skin is warm and dry.  Neurological:     Mental Status: She is alert.  Psychiatric:        Speech: Speech normal.        Behavior: Behavior normal.        Thought Content: Thought content normal.       Assessment & Plan:   Problem List Items Addressed This Visit       Other   Dysuria - Primary    UA POC is  positive for blood, small leukocytes.  Negative  nitrites.  In the setting of chills, I recommended patient go ahead and start Macrobid head of urine culture due to concern for UTI.  Will await urine culture.  She will let me know how she is doing       Relevant Medications   nitrofurantoin, macrocrystal-monohydrate, (MACROBID) 100 MG capsule   Other Relevant Orders   POCT Urinalysis Dipstick (Completed)   Urine Culture   Urinalysis, Routine w reflex microscopic (Completed)   Postmenopausal bleeding    Reassuring pelvic exam today with no evidence of cervical or vaginal bleeding.  Pap smear is up-to-date.  [place] 64.  Concern for postmenopausal bleeding. I question if related to previous birth control use including Depo-Provera and the Nexplanon which was removed 6 months ago.  I ordered ultrasound transvaginal and also placed referral to GYN further evaluation.       Relevant Orders   US PELVIC COMPLETE WITH TRANSVAGINAL   Ambulatory referral to Obstetrics / Gynecology      I am having [name] "Kennyth Lose" start on nitrofurantoin (macrocrystal-monohydrate). I am also having her maintain her multivitamin with minerals, Omega-3, amLODipine, Opzelura, buPROPion, and pravastatin.   Meds ordered this encounter  Medications   nitrofurantoin, macrocrystal-monohydrate, (MACROBID) 100 MG capsule    Sig: Take 1 capsule (100 mg total) by mouth 2 (two) times daily. Take with food.    Dispense:  10 capsule    Refill:  0    Order Specific Question:   Supervising Provider    Answer:   Crecencio Mc [[id number]]    Return precautions given.   Risks, benefits, and alternatives of the medications and treatment plan prescribed today were discussed, and patient expressed understanding.   Education regarding symptom management and diagnosis given to patient on AVS.  Continue to follow with Einar Pheasant, MD for routine health maintenance.   [name] and I agreed with plan.   Mable Paris, FNP

## 2022-03-27 LAB — URINE CULTURE
MICRO NUMBER:: 13483608
SPECIMEN QUALITY:: ADEQUATE

## 2022-03-28 ENCOUNTER — Other Ambulatory Visit: Payer: Self-pay

## 2022-03-28 ENCOUNTER — Telehealth: Payer: Self-pay

## 2022-03-28 DIAGNOSIS — N390 Urinary tract infection, site not specified: Secondary | ICD-10-CM

## 2022-03-28 NOTE — Telephone Encounter (Signed)
LMTCB to go over results and schedule lab appt. UA and Urine culture ordered

## 2022-03-31 ENCOUNTER — Other Ambulatory Visit: Payer: Self-pay | Admitting: Internal Medicine

## 2022-04-01 ENCOUNTER — Other Ambulatory Visit: Payer: Self-pay

## 2022-04-01 MED ORDER — AMLODIPINE BESYLATE 2.5 MG PO TABS
2.5000 mg | ORAL_TABLET | Freq: Every day | ORAL | 1 refills | Status: DC
  Filled 2022-04-01: qty 90, 90d supply, fill #0
  Filled 2022-07-03: qty 90, 90d supply, fill #1

## 2022-04-01 MED ORDER — BUPROPION HCL ER (XL) 150 MG PO TB24
150.0000 mg | ORAL_TABLET | Freq: Two times a day (BID) | ORAL | 1 refills | Status: DC
  Filled 2022-04-01: qty 180, 90d supply, fill #0
  Filled 2022-07-03: qty 180, 90d supply, fill #1

## 2022-04-02 ENCOUNTER — Ambulatory Visit
Admission: RE | Admit: 2022-04-02 | Discharge: 2022-04-02 | Disposition: A | Discharge status: Home / Self Care | Payer: 59 | Source: Ambulatory Visit | Attending: Family | Admitting: Family

## 2022-04-02 DIAGNOSIS — N950 Postmenopausal bleeding: Secondary | ICD-10-CM | POA: Insufficient documentation

## 2022-04-03 ENCOUNTER — Other Ambulatory Visit: Payer: Self-pay | Admitting: Family

## 2022-04-03 ENCOUNTER — Ambulatory Visit: Payer: 59

## 2022-04-03 DIAGNOSIS — N950 Postmenopausal bleeding: Secondary | ICD-10-CM

## 2022-04-04 ENCOUNTER — Telehealth: Payer: Self-pay

## 2022-04-04 NOTE — Telephone Encounter (Signed)
LMTCB to office to go over results. 

## 2022-04-08 ENCOUNTER — Telehealth: Payer: Self-pay

## 2022-04-08 NOTE — Telephone Encounter (Signed)
LMTCB for lab results.  

## 2022-05-09 ENCOUNTER — Other Ambulatory Visit (INDEPENDENT_AMBULATORY_CARE_PROVIDER_SITE_OTHER): Payer: 59

## 2022-05-09 ENCOUNTER — Other Ambulatory Visit: Payer: 59

## 2022-05-09 DIAGNOSIS — N390 Urinary tract infection, site not specified: Secondary | ICD-10-CM | POA: Diagnosis not present

## 2022-05-09 DIAGNOSIS — R319 Hematuria, unspecified: Secondary | ICD-10-CM

## 2022-05-09 LAB — URINALYSIS
Bilirubin Urine: NEGATIVE
Hgb urine dipstick: NEGATIVE
Ketones, ur: NEGATIVE
Leukocytes,Ua: NEGATIVE
Nitrite: NEGATIVE
Specific Gravity, Urine: 1.025 (ref 1.000–1.030)
Total Protein, Urine: NEGATIVE
Urine Glucose: NEGATIVE
Urobilinogen, UA: 0.2 (ref 0.0–1.0)
pH: 6 (ref 5.0–8.0)

## 2022-05-10 LAB — URINE CULTURE
MICRO NUMBER:: 13673156
Result:: NO GROWTH
SPECIMEN QUALITY:: ADEQUATE

## 2022-05-13 ENCOUNTER — Encounter: Payer: Self-pay | Admitting: *Deleted

## 2022-05-13 ENCOUNTER — Other Ambulatory Visit (HOSPITAL_COMMUNITY)
Admission: RE | Admit: 2022-05-13 | Discharge: 2022-05-13 | Disposition: A | Discharge status: Home / Self Care | Payer: 59 | Source: Ambulatory Visit | Attending: Internal Medicine | Admitting: Internal Medicine

## 2022-05-13 DIAGNOSIS — R87610 Atypical squamous cells of undetermined significance on cytologic smear of cervix (ASC-US): Secondary | ICD-10-CM | POA: Diagnosis not present

## 2022-05-14 ENCOUNTER — Ambulatory Visit: Payer: 59 | Admitting: Internal Medicine

## 2022-05-14 ENCOUNTER — Encounter: Payer: Self-pay | Admitting: Internal Medicine

## 2022-05-14 VITALS — BP 138/80 | HR 80 | Temp 98.3°F | Resp 21 | Ht 71.0 in | Wt 246.2 lb

## 2022-05-14 DIAGNOSIS — I700 Atherosclerosis of aorta: Secondary | ICD-10-CM | POA: Diagnosis not present

## 2022-05-14 DIAGNOSIS — Z114 Encounter for screening for human immunodeficiency virus [HIV]: Secondary | ICD-10-CM

## 2022-05-14 DIAGNOSIS — R739 Hyperglycemia, unspecified: Secondary | ICD-10-CM | POA: Diagnosis not present

## 2022-05-14 DIAGNOSIS — E0789 Other specified disorders of thyroid: Secondary | ICD-10-CM

## 2022-05-14 DIAGNOSIS — N950 Postmenopausal bleeding: Secondary | ICD-10-CM | POA: Diagnosis not present

## 2022-05-14 DIAGNOSIS — Z1231 Encounter for screening mammogram for malignant neoplasm of breast: Secondary | ICD-10-CM

## 2022-05-14 DIAGNOSIS — I10 Essential (primary) hypertension: Secondary | ICD-10-CM | POA: Diagnosis not present

## 2022-05-14 DIAGNOSIS — E279 Disorder of adrenal gland, unspecified: Secondary | ICD-10-CM

## 2022-05-14 DIAGNOSIS — E7800 Pure hypercholesterolemia, unspecified: Secondary | ICD-10-CM | POA: Diagnosis not present

## 2022-05-14 DIAGNOSIS — R87610 Atypical squamous cells of undetermined significance on cytologic smear of cervix (ASC-US): Secondary | ICD-10-CM | POA: Diagnosis not present

## 2022-05-14 DIAGNOSIS — F439 Reaction to severe stress, unspecified: Secondary | ICD-10-CM

## 2022-05-14 DIAGNOSIS — Z1159 Encounter for screening for other viral diseases: Secondary | ICD-10-CM | POA: Diagnosis not present

## 2022-05-14 NOTE — Progress Notes (Signed)
[name], female   DOB: 16-Apr-1966, 56 y.o.   MRN: [id number]   Subjective:    [name], female    DOB: 1966-05-07, 56 y.o.   MRN: [id number]   Patient here for a scheduled follow up.  Marland Kitchen   HPI Here to follow up regarding her blood pressure and cholesterol and for f/u pap. Also recently been evaluated for post menopausal bleeding.  Had pelvic ultrasound.  No thickening of lining.  Did report ovarian mass as outlined.  Scheduled to see gyn.  No further bleeding.  No abdominal pain.  No chest pain or sob.  No acid reflux.  Has noticed some issues with swallowing - dysphagia.  No odynophagia.  No nausea or vomiting.     Past Medical History:  Diagnosis Date   Hyperlipidemia    Hypertension    Past Surgical History:  Procedure Laterality Date   VAGINAL DELIVERY  1989   episiotomy, Dr. Arsenio Loader   Family History  Problem Relation Age of Onset   Diabetes Mother    Dementia Mother    Stroke Father 54   Hypertension Sister    Thyroid disease Sister    Diabetes Sister    Hypertension Brother    Diabetes Brother    Hypertension Sister    Hypertension Brother    Hypertension Brother    Hypertension Brother    Hypertension Sister    Breast cancer Neg Hx    Lupus Neg Hx    Social History   Socioeconomic History   Marital status: Single    Spouse name: Not on file   Number of children: Not on file   Years of education: Not on file   Highest education level: Not on file  Occupational History   Not on file  Tobacco Use   Smoking status: Some Days    Packs/day: 0.50    Years: 15.00    Total pack years: 7.50    Types: Cigarettes   Smokeless tobacco: Never  Vaping Use   Vaping Use: Never used  Substance and Sexual Activity   Alcohol use: Yes    Alcohol/week: 0.0 standard drinks of alcohol    Comment: 2-3 times a week per patient   Drug use: No   Sexual activity: Yes    Birth control/protection: Implant  Other Topics Concern    Not on file  Social History Narrative   Lives in [place]. 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      Diet: regular diet, limits fried food   Exercise: walks goal 24mn daily   Social Determinants of Health   Financial Resource Strain: Not on file  Food Insecurity: Not on file  Transportation Needs: Not on file  Physical Activity: Not on file  Stress: Not on file  Social Connections: Not on file     Review of Systems  Constitutional:  Negative for appetite change and unexpected weight change.  HENT:  Negative for congestion and sinus pressure.   Respiratory:  Negative for cough, chest tightness and shortness of breath.   Cardiovascular:  Negative for chest pain, palpitations and leg swelling.  Gastrointestinal:  Negative for abdominal pain, diarrhea, nausea and vomiting.  Genitourinary:  Negative for difficulty urinating and dysuria.  Musculoskeletal:  Negative for joint swelling and myalgias.  Skin:  Negative for color change and rash.  Neurological:  Negative for dizziness, light-headedness and headaches.  Psychiatric/Behavioral:  Negative for agitation and dysphoric mood.  Objective:     BP 138/80 (BP Location: Left Arm, Patient Position: Sitting, Cuff Size: Normal)   Pulse 80   Temp 98.3 F (36.8 C) (Oral)   Resp (!) 21   Ht 5' 11"  (1.803 m)   Wt 246 lb 3.2 oz (111.7 kg)   SpO2 100%   BMI 34.34 kg/m  Wt Readings from Last 3 Encounters:  05/14/22 246 lb 3.2 oz (111.7 kg)  03/25/22 248 lb 12.8 oz (112.9 kg)  02/07/22 235 lb (106.6 kg)    Physical Exam Vitals reviewed.  Constitutional:      General: She is not in acute distress.    Appearance: Normal appearance.  HENT:     Head: Normocephalic and atraumatic.     Right Ear: External ear normal.     Left Ear: External ear normal.  Eyes:     General: No scleral icterus.       Right eye: No discharge.        Left eye: No discharge.     Conjunctiva/sclera: Conjunctivae  normal.  Neck:     Thyroid: No thyromegaly.  Cardiovascular:     Rate and Rhythm: Normal rate and regular rhythm.  Pulmonary:     Effort: No respiratory distress.     Breath sounds: Normal breath sounds. No wheezing.  Abdominal:     General: Bowel sounds are normal.     Palpations: Abdomen is soft.     Tenderness: There is no abdominal tenderness.  Genitourinary:    Comments: Normal external genitalia.  Vaginal vault without lesions.  Cervix identified.  Pap smear performed.  Could not appreciate any adnexal masses or tenderness.   Musculoskeletal:        General: No swelling or tenderness.     Cervical back: Neck supple. No tenderness.  Lymphadenopathy:     Cervical: No cervical adenopathy.  Skin:    Findings: No erythema or rash.  Neurological:     Mental Status: She is alert.  Psychiatric:        Mood and Affect: Mood normal.        Behavior: Behavior normal.      Outpatient Encounter Medications as of 05/14/2022  Medication Sig   amLODipine (NORVASC) 2.5 MG tablet Take 1 tablet (2.5 mg total) by mouth daily.   buPROPion (WELLBUTRIN XL) 150 MG 24 hr tablet Take 1 tablet (150 mg total) by mouth 2 (two) times daily.   hydrochlorothiazide (HYDRODIURIL) 25 MG tablet TAKE 1 TABLET BY MOUTH DAILY   Multiple Vitamin (MULTIVITAMIN WITH MINERALS) TABS tablet Take 1 tablet by mouth daily.   Omega-3 1000 MG CAPS Take 1 capsule by mouth daily.   pravastatin (PRAVACHOL) 20 MG tablet Take 1 tablet (20 mg total) by mouth daily.   Ruxolitinib Phosphate (OPZELURA) 1.5 % CREA Apply 1 application topically daily. Apply to face daily   [DISCONTINUED] nitrofurantoin, macrocrystal-monohydrate, (MACROBID) 100 MG capsule Take 1 capsule (100 mg total) by mouth 2 (two) times daily. Take with food. (Patient not taking: Reported on 05/14/2022)   No facility-administered encounter medications on file as of 05/14/2022.     Lab Results  Component Value Date   WBC 5.4 02/20/2022   HGB 13.8 02/20/2022    HCT 40.8 02/20/2022   PLT 231.0 02/20/2022   GLUCOSE 116 (H) 02/20/2022   CHOL 212 (H) 02/20/2022   TRIG 73.0 02/20/2022   HDL 73.00 02/20/2022   LDLCALC 125 (H) 02/20/2022   ALT 24 02/20/2022   AST 20 02/20/2022  NA 138 02/20/2022   K 4.1 02/20/2022   CL 102 02/20/2022   CREATININE 0.69 02/20/2022   BUN 10 02/20/2022   CO2 26 02/20/2022   TSH 0.44 02/20/2022   HGBA1C 6.0 02/20/2022    US PELVIC COMPLETE WITH TRANSVAGINAL  Result Date: 04/02/2022 CLINICAL DATA:  Post menopausal bleeding EXAM: TRANSABDOMINAL AND TRANSVAGINAL ULTRASOUND OF PELVIS TECHNIQUE: Both transabdominal and transvaginal ultrasound examinations of the pelvis were performed. Transabdominal technique was performed for global imaging of the pelvis including uterus, ovaries, adnexal regions, and pelvic cul-de-sac. It was necessary to proceed with endovaginal exam following the transabdominal exam to visualize the uterus endometrium adnexa. COMPARISON:  None Available. FINDINGS: Uterus Measurements: 6.3 x 3 x 4 cm = volume: 39 mL. No fibroids or other mass visualized. Endometrium Thickness: 1.5 mm.  No focal abnormality visualized. Right ovary Measurements: 3.1 x 1.6 x 1.7 cm = volume: 4.5 mL. Heterogenous shadowing mass likely with calcifications, this measures 2 x 1.8 x 1.3 cm. Left ovary Measurements: 1.9 x 1.2 x 1.2 cm = volume: 1.5 mL. Normal appearance/no adnexal mass. Other findings No abnormal free fluid. IMPRESSION: 1. Endometrial thickness of 1.5 mm. In the setting of post-menopausal bleeding, this is consistent with a benign etiology such as endometrial atrophy. If bleeding remains unresponsive to hormonal or medical had therapy, sonohysterogram should be considered for focal lesion work-up. (Ref: Radiological Reasoning: Algorithmic Workup of Abnormal Vaginal Bleeding with Endovaginal Sonography and Sonohysterography. AJR 2008; 836:O29-47) 2. Heterogenous shadowing mass in the right ovary, likely a mass containing  calcifications, this measures up to 2 cm. Surgical consultation is recommended. Electronically Signed   By: Donavan Foil M.D.   On: 04/02/2022 17:24       Assessment & Plan:   Problem List Items Addressed This Visit     Abnormal Pap smear of cervix    Last pap  - ASCUS.  Repeat pap at next appt.  Repeat pap today.       Relevant Orders   Cytology - PAP( [place]) (Completed)   Aortic atherosclerosis ([facility])    Continue pravastatin.       Hypercholesterolemia    On pravastatin.  Low cholesterol diet and exercise.  Follow lipid panel.        Relevant Orders   Lipid Profile   Hepatic function panel   Hyperglycemia    Low carb diet and exercise.  Follow met b and a1c.       Relevant Orders   HgB A1c   Hypertension    On hctz and amlodipine. Blood pressure as outlined.  Follow pressures. Follow metabolic panel.       Relevant Orders   Basic Metabolic Panel (BMET)   Lesion of adrenal gland ([place])    Two nodules noted on chest CT 01/2022.  Will follow.        Postmenopausal bleeding    Had pelvic ultrasound with results as outlined:  Endometrial thickness of 1.5 mm. In the setting of post-menopausal bleeding, this is consistent with a benign etiology such as endometrial atrophy. If bleeding remains unresponsive to hormonal or medical therapy, sonohysterogram should be considered for focal lesion work-up. (Ref: Radiological Reasoning: Algorithmic Workup of Abnormal Vaginal Bleeding with Endovaginal Sonography and Sonohysterography. AJR 2008; 654:Y50-35) 2. Heterogenous shadowing mass in the right ovary, likely a mass containing calcifications, this measures up to 2 cm. Referred to gyn.  No further bleeding.       Screening for breast cancer - Primary   Relevant  Orders   MM 3D SCREEN BREAST BILATERAL   Stress    On wellbutrin.  Overall appears to be handling things relatively well.  Has good support.  Follow.       Thyroid fullness    Previously evaluated by Dr Eddie Dibbles.  Was  previously scheduled for f/u thyroid ultrasound.  Cancelled due to financial issues.  Will plan for f/u with endocrinology. See if agreeable once above issues sorted through       Other Visit Diagnoses     Screening for HIV (human immunodeficiency virus)       Relevant Orders   HIV antibody (with reflex)   Encounter for hepatitis C screening test for low risk patient       Relevant Orders   Hepatitis C Antibody        Einar Pheasant, MD

## 2022-05-16 ENCOUNTER — Telehealth: Payer: Self-pay | Admitting: Internal Medicine

## 2022-05-17 LAB — CYTOLOGY - PAP
Comment: NEGATIVE
Diagnosis: NEGATIVE
High risk HPV: NEGATIVE

## 2022-05-20 ENCOUNTER — Encounter: Payer: Self-pay | Admitting: Internal Medicine

## 2022-05-20 ENCOUNTER — Telehealth: Payer: Self-pay | Admitting: Family

## 2022-05-20 NOTE — Assessment & Plan Note (Signed)
Last pap  - ASCUS.  Repeat pap at next appt.  Repeat pap today.

## 2022-05-20 NOTE — Assessment & Plan Note (Signed)
On wellbutrin.  Overall appears to be handling things relatively well.  Has good support.  Follow.

## 2022-05-20 NOTE — Assessment & Plan Note (Signed)
Previously evaluated by Dr Eddie Dibbles.  Was previously scheduled for f/u thyroid ultrasound.  Cancelled due to financial issues.  Will plan for f/u with endocrinology. See if agreeable once above issues sorted through

## 2022-05-20 NOTE — Assessment & Plan Note (Addendum)
Had pelvic ultrasound with results as outlined:  Endometrial thickness of 1.5 mm. In the setting of post-menopausal bleeding, this is consistent with a benign etiology such as endometrial atrophy. If bleeding remains unresponsive to hormonal or medical therapy, sonohysterogram should be considered for focal lesion work-up. (Ref: Radiological Reasoning: Algorithmic Workup of Abnormal Vaginal Bleeding with Endovaginal Sonography and Sonohysterography. AJR 2008; 732:K02-54) 2. Heterogenous shadowing mass in the right ovary, likely a mass containing calcifications, this measures up to 2 cm. Referred to gyn.  No further bleeding.

## 2022-05-20 NOTE — Assessment & Plan Note (Signed)
Continue pravastatin 

## 2022-05-20 NOTE — Telephone Encounter (Signed)
Jenate call pt  I received a note from referral coordinator that Marice Potter has tried to reach her to schedule an appt   Please ask pt to call [facility] to schedule an appointment  [phone]  I placed referral due to Ultrasound of uterus shows right ovarian mass, 2cm and Postmenopausal vaginal bleeding  FYI Dr Nicki Reaper

## 2022-05-20 NOTE — Assessment & Plan Note (Signed)
Low carb diet and exercise.  Follow met b and a1c.  

## 2022-05-20 NOTE — Assessment & Plan Note (Signed)
Two nodules noted on chest CT 01/2022.  Will follow.

## 2022-05-20 NOTE — Telephone Encounter (Signed)
Agree - with ultrasound revealing ovarian mass (pt is aware) - needs f/u with gyn.  See note.

## 2022-05-20 NOTE — Assessment & Plan Note (Signed)
On pravastatin.  Low cholesterol diet and exercise.  Follow lipid panel.

## 2022-05-20 NOTE — Assessment & Plan Note (Signed)
On hctz and amlodipine. Blood pressure as outlined.  Follow pressures. Follow metabolic panel.

## 2022-05-21 NOTE — Telephone Encounter (Signed)
S/w pt - stated she has already scheduled an appointment w/ [facility] for October. Pt stated she will call over to [facility] today to see if they can get her in sooner than October. Phone # given to pt.   Anselmo Pickler

## 2022-05-21 NOTE — Telephone Encounter (Signed)
Noted.  Have her call and given [place] update.

## 2022-05-21 NOTE — Telephone Encounter (Signed)
noted 

## 2022-06-04 ENCOUNTER — Encounter: Payer: Self-pay | Admitting: Obstetrics and Gynecology

## 2022-06-19 ENCOUNTER — Other Ambulatory Visit: Payer: Self-pay

## 2022-06-19 DIAGNOSIS — N950 Postmenopausal bleeding: Secondary | ICD-10-CM | POA: Diagnosis not present

## 2022-06-19 DIAGNOSIS — N83201 Unspecified ovarian cyst, right side: Secondary | ICD-10-CM | POA: Diagnosis not present

## 2022-06-21 ENCOUNTER — Encounter: Payer: Self-pay | Admitting: Internal Medicine

## 2022-06-21 DIAGNOSIS — N83209 Unspecified ovarian cyst, unspecified side: Secondary | ICD-10-CM | POA: Insufficient documentation

## 2022-07-01 IMAGING — CT CT CHEST LUNG CANCER SCREENING LOW DOSE W/O CM
2 of 5 series · 15 of 40 positions shown, 18 images · non-contrast
Comparison: No priors.

CLINICAL DATA: 55-year-old female current smoker with 28 pack-year
history of smoking. Lung cancer screening examination.



[Series 3: lung 1.00 · axial · 0.70mm/px · z∈[-1204,-916]mm · 12 of 318 slices shown, 15 images]
[im 15/318  mediastinal]
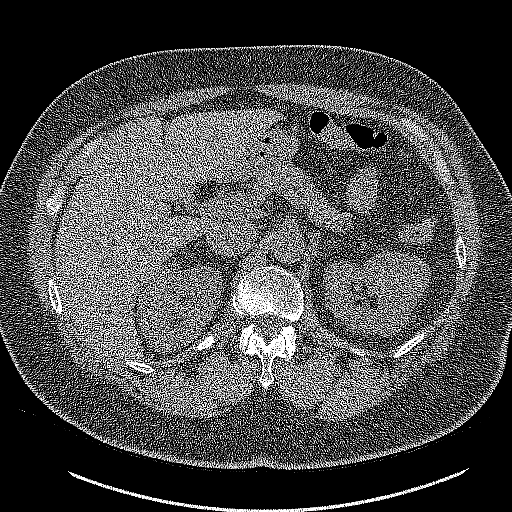
[im 15/318  lung]
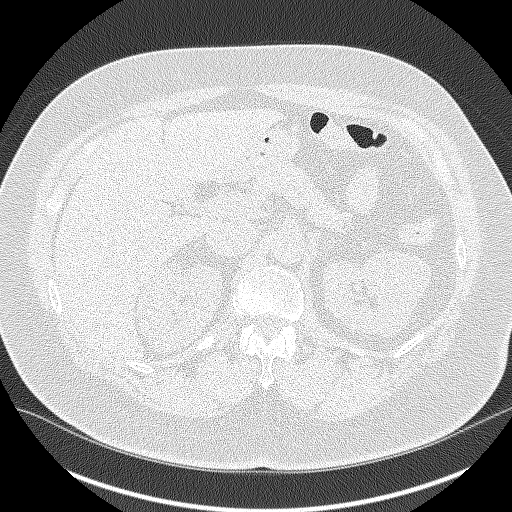
[im 44/318  lung]
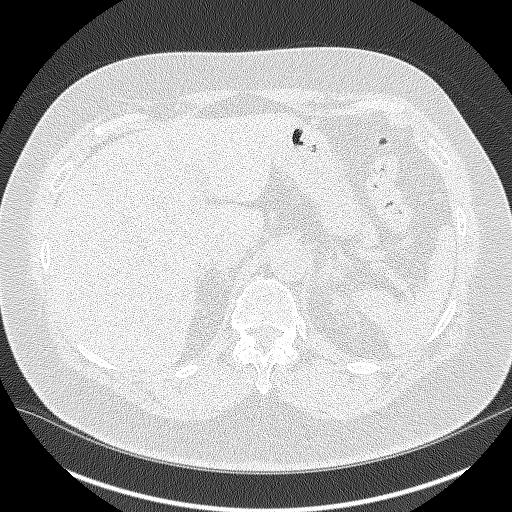
[im 73/318  lung]
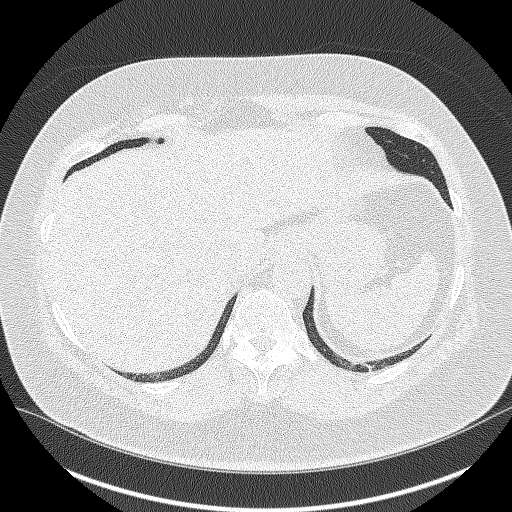
[im 101/318  lung]
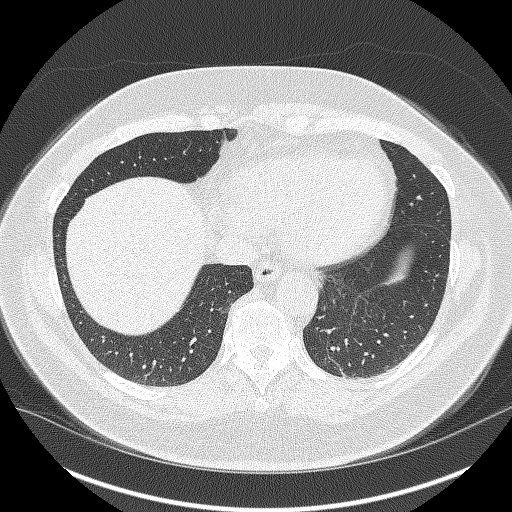
[im 116/318  mediastinal]
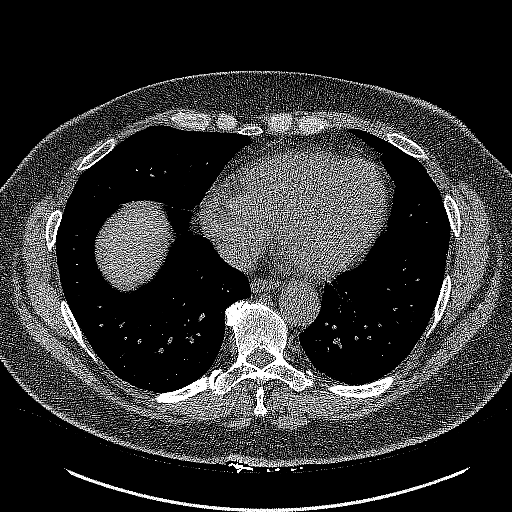
[im 116/318  lung]
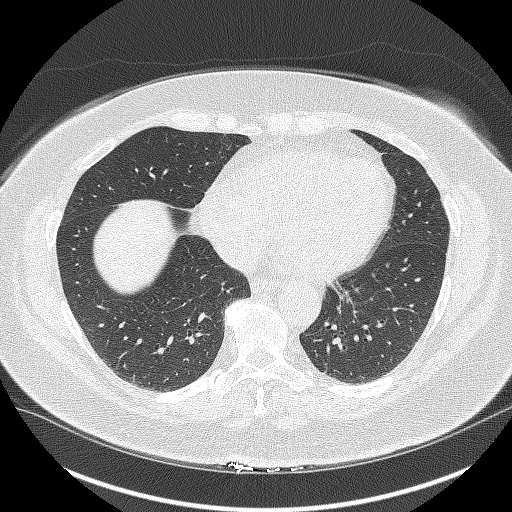
[im 145/318  lung]
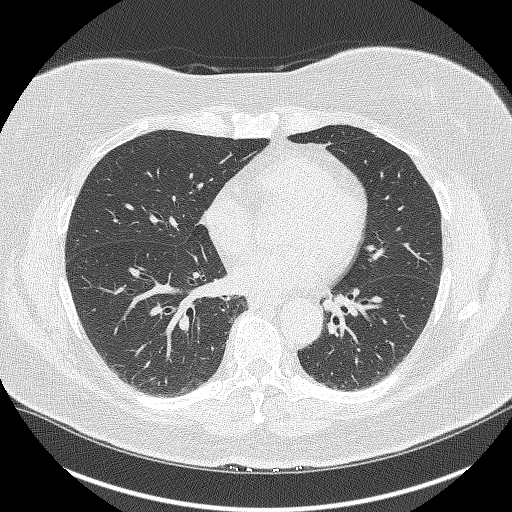
[im 173/318  lung]
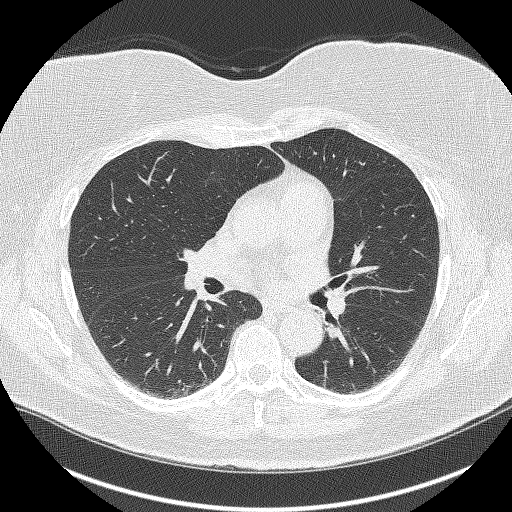
[im 202/318  lung]
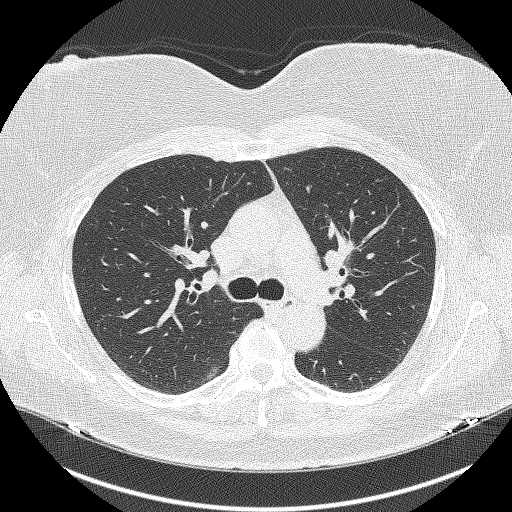
[im 217/318  mediastinal]
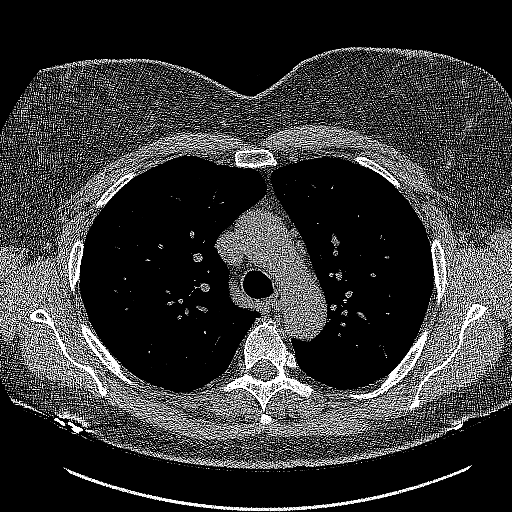
[im 217/318  lung]
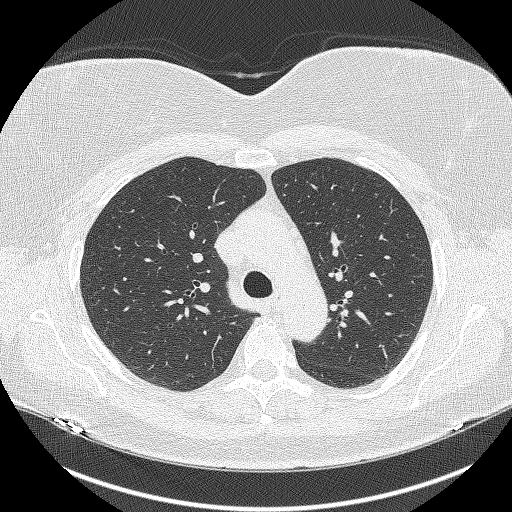
[im 245/318  lung]
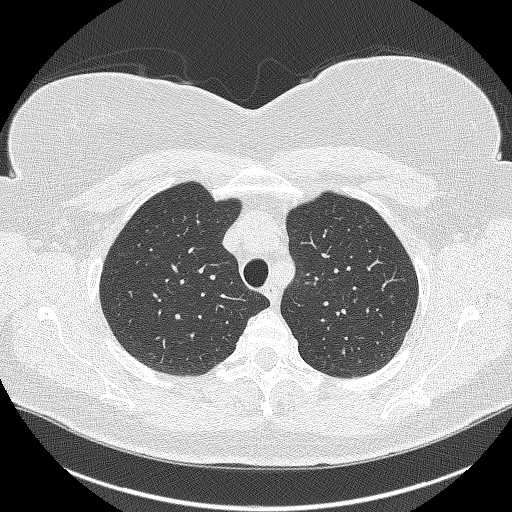
[im 274/318  lung]
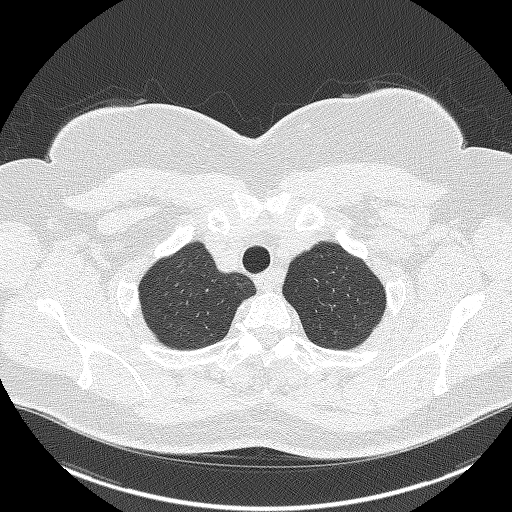
[im 303/318  lung]
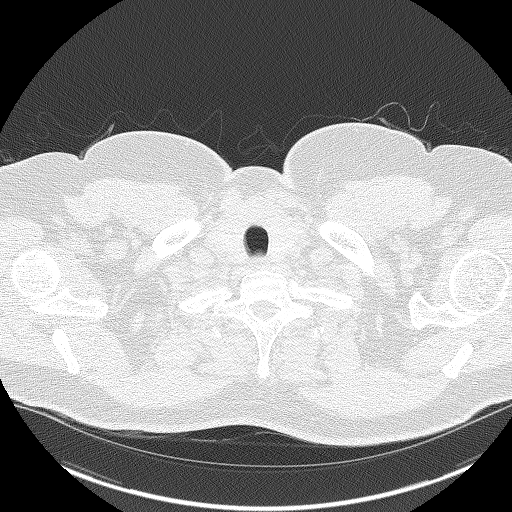

[Series 5: coronals lung 1.00 cor · coronal · 0.62mm/px · 3 of 352 slices shown]
[im 71/352  lung]
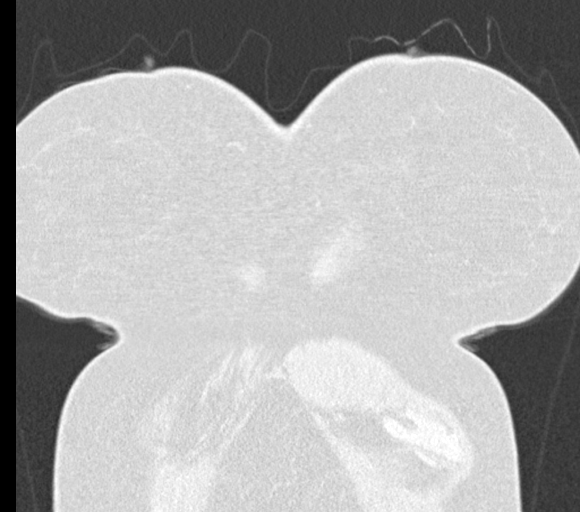
[im 141/352  lung]
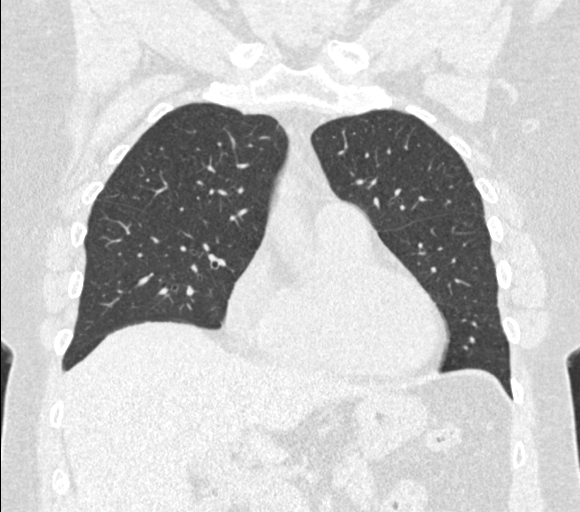
[im 211/352  lung]
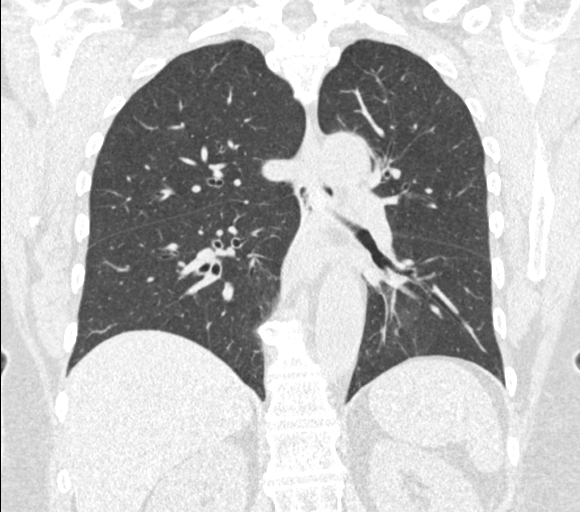

[15 of 40 positions shown; findings below may reference images not displayed]

FINDINGS: Cardiovascular: Heart size is normal. There is no significant
pericardial fluid, thickening or pericardial calcification.
Atherosclerotic calcifications are noted in the aorta. No definite
coronary artery calcifications.

Mediastinum/Nodes: No pathologically enlarged mediastinal or hilar
lymph nodes. Please note that accurate exclusion of hilar adenopathy
is limited on noncontrast CT scans. Esophagus is unremarkable in
appearance. No axillary lymphadenopathy.

Lungs/Pleura: Small pulmonary nodule in the right lower lobe (axial
image 163 of series 3), with a volume derived mean diameter of
mm. No larger more suspicious appearing pulmonary nodules or masses
are noted. No acute consolidative airspace disease. No pleural
effusions. Mild diffuse bronchial wall thickening with very mild
centrilobular and paraseptal emphysema.

Upper Abdomen: There are 2 adrenal nodules in the left adrenal
gland, 1 in the medial limb (axial image 55 of series 2) measuring
1.5 x 0.8 cm (-4 HU), and 1 in the genu of the adrenal gland (axial
image 58 of series 2) measuring 1.7 x 1.3 cm (16 HU).

Musculoskeletal: There are no aggressive appearing lytic or blastic
lesions noted in the visualized portions of the skeleton.
IMPRESSION: 1. Lung-RADS 2, benign appearance or behavior. Continue annual
screening with low-dose chest CT without contrast in 12 months.
2. Aortic atherosclerosis.
3. Mild diffuse bronchial wall thickening with very mild
centrilobular and paraseptal emphysema; imaging findings suggestive
of underlying COPD.
4. Small left adrenal nodules, one of which is compatible with a
benign adenoma. The other nodule is technically not characterized,
but statistically likely a benign lesions such as a lipid poor
adenoma. Attention at time of repeat low-dose lung cancer screening
chest CT is recommended to ensure continued stability.

Aortic Atherosclerosis (4AKP7-LQL.L) and Emphysema (4AKP7-Q11.2).

## 2022-07-03 ENCOUNTER — Other Ambulatory Visit: Payer: Self-pay

## 2022-07-16 ENCOUNTER — Encounter: Payer: Self-pay | Admitting: Obstetrics and Gynecology

## 2022-07-19 ENCOUNTER — Other Ambulatory Visit: Payer: Self-pay

## 2022-07-23 NOTE — Progress Notes (Unsigned)
Name: Paula Wallace  MRN/ DOB: [id number], 20-Aug-1966    Age/ Sex: 56 y.o., female    PCP: Einar Pheasant, MD   Reason for Endocrinology Evaluation: MNG     Date of Initial Endocrinology Evaluation: 07/24/2022     HPI: Ms. Paula Wallace is a 56 y.o. female with a past medical history of HTN, GERD. The patient presented for initial endocrinology clinic visit on 07/24/2022 for consultative assistance with her MNG.   Patient has been diagnosed with multinodular goiter based on ultrasound dated 05/2008.  She is s/p benign FNA of the left thyroid nodule on 05/10/2010 a follow up ultrasound was done in 2016 through [facility] showing MNG again     She has noted coughing with occasional strangulation sensation while eating certain things  She has noted increased fullness of the thyroid  She has occasional heartburn  Denies palpitations  Has occasional loose stools nor diarrhea  Denies tremors   Denies radiation exposure   Mother with Goiter requiring sx        HISTORY:  Past Medical History:  Past Medical History:  Diagnosis Date   Hyperlipidemia    Hypertension    Past Surgical History:  Past Surgical History:  Procedure Laterality Date   VAGINAL DELIVERY  1989   episiotomy, Dr. Arsenio Loader    Social History:  reports that she has been smoking cigarettes. She has a 7.50 pack-year smoking history. She has never used smokeless tobacco. She reports current alcohol use. She reports that she does not use drugs. Family History: family history includes Dementia in her mother; Diabetes in her brother, mother, and sister; Hypertension in her brother, brother, brother, brother, sister, sister, and sister; Stroke (age of onset: 21) in her father; Thyroid disease in her sister.   HOME MEDICATIONS: Allergies as of 07/24/2022       Reactions   Keflex [cephalexin] Rash   Mupirocin         Medication List        Accurate as of July 24, 2022 10:52 AM. If you have any  questions, ask your nurse or doctor.          STOP taking these medications    Omega-3 1000 MG Caps Stopped by: Dorita Sciara, MD       TAKE these medications    amLODipine 2.5 MG tablet Commonly known as: NORVASC Take 1 tablet (2.5 mg total) by mouth daily.   B COMPLEX 100 PO Take by mouth.   buPROPion 150 MG 24 hr tablet Commonly known as: WELLBUTRIN XL Take 1 tablet (150 mg total) by mouth 2 (two) times daily.   hydrochlorothiazide 25 MG tablet Commonly known as: HYDRODIURIL TAKE 1 TABLET BY MOUTH DAILY   multivitamin with minerals Tabs tablet Take 1 tablet by mouth daily.   Opzelura 1.5 % Crea Generic drug: Ruxolitinib Phosphate Apply 1 application topically daily. Apply to face daily   pravastatin 20 MG tablet Commonly known as: PRAVACHOL Take 1 tablet (20 mg total) by mouth daily.   QC TUMERIC COMPLEX PO Take by mouth.          REVIEW OF SYSTEMS: A comprehensive ROS was conducted with the patient and is negative except as per HPI     OBJECTIVE:  VS: BP 126/82 (BP Location: Left Arm, Patient Position: Sitting, Cuff Size: Large)   Pulse 69   Ht '5\' 11"'$  (1.803 m)   Wt 247 lb (112 kg)   SpO2 99%   BMI 34.45  kg/m    Wt Readings from Last 3 Encounters:  07/24/22 247 lb (112 kg)  05/14/22 246 lb 3.2 oz (111.7 kg)  03/25/22 248 lb 12.8 oz (112.9 kg)     EXAM: General: Pt appears well and is in NAD  Eyes: External eye exam normal without stare, lid lag or exophthalmos.  EOM intact.  PERRL.  Neck: General: Supple without adenopathy. Thyroid: Thyroid size normal.  No goiter or nodules appreciated. No thyroid bruit.  Lungs: Clear with good BS bilat with no rales, rhonchi, or wheezes  Heart: Auscultation: RRR.  Abdomen: Normoactive bowel sounds, soft, nontender, without masses or organomegaly palpable  Extremities:  BL LE: No pretibial edema normal ROM and strength.  Mental Status: Judgment, insight: Intact Orientation: Oriented to  time, place, and person Mood and affect: No depression, anxiety, or agitation     DATA REVIEWED: ***    Latest Reference Range & Units 02/20/22 09:23  TSH 0.35 - 5.50 uIU/mL 0.44   ASSESSMENT/PLAN/RECOMMENDATIONS:   MNG:  - Pt with local neck symptoms  - S/P benign FNA in 2011 - She is clinically euthyroid       2. Cough :  - Its unclear if this is related to MNG vs GERD. I have asked her to try OTC PPI for 6 weeks and monitor cough symptoms      Signed electronically by: Mack Guise, MD  [facility] End[facility]  [facility] [address] [place], [place] [address] Phone: [phone] FAX: [phone]   CC: Einar Pheasant, [address] [place] [address] Phone: [phone] Fax: [phone]   Return to Endocrinology clinic as below: Future Appointments  Date Time Provider [place]  08/22/2022  9:40 AM [facility]  11/07/2022  8:30 AM Alfonso Patten, MD [facility] None

## 2022-07-24 ENCOUNTER — Encounter: Payer: Self-pay | Admitting: Internal Medicine

## 2022-07-24 ENCOUNTER — Ambulatory Visit: Payer: 59 | Admitting: Internal Medicine

## 2022-07-24 VITALS — BP 126/82 | HR 69 | Ht 71.0 in | Wt 247.0 lb

## 2022-07-24 DIAGNOSIS — E042 Nontoxic multinodular goiter: Secondary | ICD-10-CM

## 2022-07-24 DIAGNOSIS — R052 Subacute cough: Secondary | ICD-10-CM | POA: Diagnosis not present

## 2022-07-24 LAB — TSH: TSH: 0.69 u[IU]/mL (ref 0.35–5.50)

## 2022-07-24 LAB — T4, FREE: Free T4: 0.86 ng/dL (ref 0.60–1.60)

## 2022-07-24 NOTE — Patient Instructions (Signed)
Please take Prilosec 20 mg daily  when you first get up from sleep for 6 weeks and note if the symptoms are improving, this will determine future thyroid nodule plan

## 2022-07-25 DIAGNOSIS — R052 Subacute cough: Secondary | ICD-10-CM | POA: Insufficient documentation

## 2022-07-25 DIAGNOSIS — E042 Nontoxic multinodular goiter: Secondary | ICD-10-CM | POA: Insufficient documentation

## 2022-08-22 ENCOUNTER — Ambulatory Visit
Admission: RE | Admit: 2022-08-22 | Discharge: 2022-08-22 | Disposition: A | Discharge status: Home / Self Care | Payer: 59 | Source: Ambulatory Visit | Attending: Internal Medicine | Admitting: Internal Medicine

## 2022-08-22 DIAGNOSIS — Z1231 Encounter for screening mammogram for malignant neoplasm of breast: Secondary | ICD-10-CM | POA: Insufficient documentation

## 2022-09-17 DIAGNOSIS — H5213 Myopia, bilateral: Secondary | ICD-10-CM | POA: Diagnosis not present

## 2022-09-19 ENCOUNTER — Other Ambulatory Visit: Payer: Self-pay

## 2022-10-05 ENCOUNTER — Telehealth: Payer: 59 | Admitting: Nurse Practitioner

## 2022-10-05 DIAGNOSIS — J3489 Other specified disorders of nose and nasal sinuses: Secondary | ICD-10-CM

## 2022-10-05 MED ORDER — MUPIROCIN 2 % EX OINT: 1.0000 | TOPICAL_OINTMENT | Freq: Two times a day (BID) | CUTANEOUS | 0 refills | Status: DC

## 2022-10-05 NOTE — Progress Notes (Signed)
Virtual Visit Consent   Paula Wallace, you are scheduled for a virtual visit with Mary-Margaret Hassell Done, [place], a [facility] provider, today.     Just as with appointments in the office, your consent must be obtained to participate.  Your consent will be active for this visit and any virtual visit you may have with one of our providers in the next 365 days.     If you have a MyChart account, a copy of this consent can be sent to you electronically.  All virtual visits are billed to your insurance company just like a traditional visit in the office.    As this is a virtual visit, video technology does not allow for your provider to perform a traditional examination.  This may limit your provider's ability to fully assess your condition.  If your provider identifies any concerns that need to be evaluated in person or the need to arrange testing (such as labs, EKG, etc.), we will make arrangements to do so.     Although advances in technology are sophisticated, we cannot ensure that it will always work on either your end or our end.  If the connection with a video visit is poor, the visit may have to be switched to a telephone visit.  With either a video or telephone visit, we are not always able to ensure that we have a secure connection.     I need to obtain your verbal consent now.   Are you willing to proceed with your visit today? YES   Paula Wallace has provided verbal consent on 10/05/2022 for a virtual visit (video or telephone).   Mary-Margaret Hassell Done, FNP   Date: 10/05/2022 3:50 PM   Virtual Visit via Video Note   I, Mary-Margaret Kaelyn Nauta, connected with Paula Wallace ([id number], 1965/11/28) on 10/05/22 at  4:15 PM EST by a video-enabled telemedicine application and verified that I am speaking with the correct person using two identifiers.  Location: Patient: Virtual Visit Location Patient: Home Provider: Virtual Visit Location Provider: Mobile   I discussed the  limitations of evaluation and management by telemedicine and the availability of in person appointments. The patient expressed understanding and agreed to proceed.    History of Present Illness: Paula Wallace is a 56 y.o. who identifies as a female who was assigned female at birth, and is being seen today for uri.  HPI: Inflammed sore inside left side of nose. Hard to breathe through. Has some bumps on th eoutside of her nose as well.     Review of Systems  Constitutional:  Negative for fever.  HENT:  Positive for congestion (slight). Negative for sinus pain.     Problems:  Patient Active Problem List   Diagnosis Date Noted   Multinodular goiter 07/25/2022   Subacute cough 07/25/2022   Ovarian cyst 06/21/2022   Dysuria 03/25/2022   Postmenopausal bleeding 03/25/2022   Lesion of adrenal gland ([place]) 02/12/2022   Aortic atherosclerosis ([place]) 02/12/2022   Abnormal Pap smear of cervix 12/24/2021   Allergic contact dermatitis 06/07/2021   Rash and other nonspecific skin eruption 06/07/2021   GERD (gastroesophageal reflux disease) 02/05/2020   Tobacco use 09/23/2018   Health care maintenance 02/06/2015   Obesity (BMI 30-39.9) 10/02/2014   Thyroid fullness 10/02/2014   Stress 05/01/2014   Rash 09/05/2013   Hyperglycemia 08/22/2013   Back pain 06/20/2013   Encounter for smoking cessation counseling 06/20/2013   Routine general medical examination at a health care facility 01/25/2013  Nonspecific abnormal electrocardiogram (ECG) (EKG) 01/25/2013   Screening for breast cancer 01/16/2012   Rosacea 01/16/2012   Hypertension 01/16/2012   Hypercholesterolemia 01/16/2012    Allergies:  Allergies  Allergen Reactions   Keflex [Cephalexin] Rash   Mupirocin    Medications:  Current Outpatient Medications:    amLODipine (NORVASC) 2.5 MG tablet, Take 1 tablet (2.5 mg total) by mouth daily., Disp: 90 tablet, Rfl: 1   B Complex Vitamins (B COMPLEX 100 PO), Take by mouth., Disp: ,  Rfl:    buPROPion (WELLBUTRIN XL) 150 MG 24 hr tablet, Take 1 tablet (150 mg total) by mouth 2 (two) times daily., Disp: 180 tablet, Rfl: 1   hydrochlorothiazide (HYDRODIURIL) 25 MG tablet, TAKE 1 TABLET BY MOUTH DAILY, Disp: 90 tablet, Rfl: 2   Multiple Vitamin (MULTIVITAMIN WITH MINERALS) TABS tablet, Take 1 tablet by mouth daily., Disp: , Rfl:    pravastatin (PRAVACHOL) 20 MG tablet, Take 1 tablet (20 mg total) by mouth daily., Disp: 90 tablet, Rfl: 3   Ruxolitinib Phosphate (OPZELURA) 1.5 % CREA, Apply 1 application topically daily. Apply to face daily, Disp: 60 g, Rfl: 2   Turmeric (QC TUMERIC COMPLEX PO), Take by mouth., Disp: , Rfl:   Observations/Objective: Patient is well-developed, well-nourished in no acute distress.  Resting comfortably  at home.  Head is normocephalic, atraumatic.  No labored breathing.  Speech is clear and coherent with logical content.  Patient is alert and oriented at baseline.    Assessment and Plan:  Paula Wallace in today with chief complaint of No chief complaint on file.   1. Nasal sore Do not pick or scratch' use ointment 2x a day  Meds ordered this encounter  Medications   mupirocin ointment (BACTROBAN) 2 %    Sig: Apply 1 Application topically 2 (two) times daily.    Dispense:  22 g    Refill:  0    Order Specific Question:   Supervising Provider    Answer:   Chase Picket [id number]     The above assessment and management plan was discussed with the patient. The patient verbalized understanding of and has agreed to the management plan. Patient is aware to call the clinic if symptoms persist or worsen. Patient is aware when to return to the clinic for a follow-up visit. Patient educated on when it is appropriate to go to the emergency department.   Mary-Margaret Hassell Done, FNP    Follow Up Instructions: I discussed the assessment and treatment plan with the patient. The patient was provided an opportunity to ask questions and  all were answered. The patient agreed with the plan and demonstrated an understanding of the instructions.  A copy of instructions were sent to the patient via MyChart.  The patient was advised to call back or seek an in-person evaluation if the symptoms worsen or if the condition fails to improve as anticipated.  Time:  I spent 6 minutes with the patient via telehealth technology discussing the above problems/concerns.    Mary-Margaret Hassell Done, FNP

## 2022-10-05 NOTE — Patient Instructions (Signed)
  [name], thank you for joining Chevis Pretty, FNP for today's virtual visit.  While this provider is not your primary care provider (PCP), if your PCP is located in our provider database this encounter information will be shared with them immediately following your visit.   [facility] account gives you access to today's visit and all your visits, tests, and labs performed at [facility] " click here if you don't have a [facility] account or go to [email or web address]  Consent: (Patient) Paula Wallace provided verbal consent for this virtual visit at the beginning of the encounter.  Current Medications:  Current Outpatient Medications:    mupirocin ointment (BACTROBAN) 2 %, Apply 1 Application topically 2 (two) times daily., Disp: 22 g, Rfl: 0   amLODipine (NORVASC) 2.5 MG tablet, Take 1 tablet (2.5 mg total) by mouth daily., Disp: 90 tablet, Rfl: 1   B Complex Vitamins (B COMPLEX 100 PO), Take by mouth., Disp: , Rfl:    buPROPion (WELLBUTRIN XL) 150 MG 24 hr tablet, Take 1 tablet (150 mg total) by mouth 2 (two) times daily., Disp: 180 tablet, Rfl: 1   hydrochlorothiazide (HYDRODIURIL) 25 MG tablet, TAKE 1 TABLET BY MOUTH DAILY, Disp: 90 tablet, Rfl: 2   Multiple Vitamin (MULTIVITAMIN WITH MINERALS) TABS tablet, Take 1 tablet by mouth daily., Disp: , Rfl:    pravastatin (PRAVACHOL) 20 MG tablet, Take 1 tablet (20 mg total) by mouth daily., Disp: 90 tablet, Rfl: 3   Ruxolitinib Phosphate (OPZELURA) 1.5 % CREA, Apply 1 application topically daily. Apply to face daily, Disp: 60 g, Rfl: 2   Turmeric (QC TUMERIC COMPLEX PO), Take by mouth., Disp: , Rfl:    Medications ordered in this encounter:  Meds ordered this encounter  Medications   mupirocin ointment (BACTROBAN) 2 %    Sig: Apply 1 Application topically 2 (two) times daily.    Dispense:  22 g    Refill:  0    Order Specific Question:   Supervising Provider    Answer:    Chase Picket [id number]     *If you need refills on other medications prior to your next appointment, please contact your pharmacy*  Follow-Up: Call back or seek an in-person evaluation if the symptoms worsen or if the condition fails to improve as anticipated.  [facility] [phone]  Other Instructions Do not pick at lesion'If not resolving needs to bee seen   If you have been instructed to have an in-person evaluation today at a local Urgent Care facility, please use the link below. It will take you to a list of all of our available [facility], including address, phone number and hours of operation. Please do not delay care.  [facility]  If you or a family member do not have a primary care provider, use the link below to schedule a visit and establish care. When you choose a [facility] primary care physician or advanced practice provider, you gain a long-term partner in health. Find a Primary Care Provider  Learn more about [facility] in-office and virtual care options: [facility]

## 2022-10-09 ENCOUNTER — Ambulatory Visit: Payer: 59 | Admitting: Family Medicine

## 2022-10-22 ENCOUNTER — Other Ambulatory Visit: Payer: Self-pay | Admitting: Internal Medicine

## 2022-10-22 ENCOUNTER — Other Ambulatory Visit: Payer: Self-pay

## 2022-10-22 NOTE — Telephone Encounter (Signed)
Prescription Request  10/22/2022  Is this a "Controlled Substance" medicine? No  LOV: 05/14/2022  What is the name of the medication or equipment? amLODipine (NORVASC) 2.5 MG tablet, and  buPROPion (WELLBUTRIN XL) 150 MG 24 hr tablet    Have you contacted your pharmacy to request a refill? Yes   Which pharmacy would you like this sent to?   [facility] [place] [address] Phone: [phone] Fax: [phone]      Patient notified that their request is being sent to the clinical staff for review and that they should receive a response within 2 business days.   Please advise at Mobile [phone] (mobile)

## 2022-10-24 ENCOUNTER — Other Ambulatory Visit: Payer: Self-pay

## 2022-10-24 MED ORDER — PRAVASTATIN SODIUM 20 MG PO TABS
20.0000 mg | ORAL_TABLET | Freq: Every day | ORAL | 3 refills | Status: DC
  Filled 2022-10-24 – 2023-01-03 (×2): qty 90, 90d supply, fill #0
  Filled 2023-04-10: qty 90, 90d supply, fill #1

## 2022-10-24 MED ORDER — AMLODIPINE BESYLATE 2.5 MG PO TABS
2.5000 mg | ORAL_TABLET | Freq: Every day | ORAL | 1 refills | Status: DC
  Filled 2022-10-24: qty 90, 90d supply, fill #0
  Filled 2023-01-30: qty 90, 90d supply, fill #1

## 2022-10-24 MED FILL — Bupropion HCl Tab ER 24HR 150 MG: ORAL | 90 days supply | Qty: 180 | Fill #0 | Status: AC

## 2022-11-07 ENCOUNTER — Encounter: Payer: Self-pay | Admitting: Dermatology

## 2022-11-07 ENCOUNTER — Ambulatory Visit (INDEPENDENT_AMBULATORY_CARE_PROVIDER_SITE_OTHER): Payer: Commercial Managed Care - PPO | Admitting: Dermatology

## 2022-11-07 DIAGNOSIS — L308 Other specified dermatitis: Secondary | ICD-10-CM

## 2022-11-07 MED ORDER — OPZELURA 1.5 % EX CREA
1.0000 "application " | TOPICAL_CREAM | Freq: Every day | CUTANEOUS | 2 refills | Status: DC
  Filled 2023-01-28: qty 60, 30d supply, fill #0

## 2022-11-07 NOTE — Progress Notes (Signed)
   Follow-Up Visit   Subjective  Paula Wallace is a 57 y.o. female who presents for the following: Follow-up (Patient here today for 1 year eczema follow up at face. Patient currently uses Opzelura as needed but has noticed some more discoloration at the face. ).  Patient advises eczema flares normally 1-2 times a month, usually with stress.   The following portions of the chart were reviewed this encounter and updated as appropriate:   Tobacco  Allergies  Meds  Problems  Med Hx  Surg Hx  Fam Hx      Review of Systems:  No other skin or systemic complaints except as noted in HPI or Assessment and Plan.  Objective  Well appearing patient in no apparent distress; mood and affect are within normal limits.  A focused examination was performed including face. Relevant physical exam findings are noted in the Assessment and Plan.  face Hypopigmented patches    Assessment & Plan  Other eczema face  With PIPA  Chronic and persistent condition with duration or expected duration over one year. Condition is symptomatic/ bothersome to patient. Not currently at goal.  Flaring a couple of times per month and getting more white spots.   Discussed need to keep inflammation shut down very effectively to prevent new white spots.   Continue Opzelura increasing frequency to nightly, twice daily with flares.   Related Medications Ruxolitinib Phosphate (OPZELURA) 1.5 % CREA Apply 1 application  topically daily. Apply to face daily   Return in about 1 year (around 11/08/2023) for Eczema.  Graciella Belton, RMA, am acting as scribe for Forest Gleason, MD .   Documentation: I have reviewed the above documentation for accuracy and completeness, and I agree with the above.  Forest Gleason, MD

## 2022-11-07 NOTE — Patient Instructions (Signed)
Due to recent changes in healthcare laws, you may see results of your pathology and/or laboratory studies on MyChart before the doctors have had a chance to review them. We understand that in some cases there may be results that are confusing or concerning to you. Please understand that not all results are received at the same time and often the doctors may need to interpret multiple results in order to provide you with the best plan of care or course of treatment. Therefore, we ask that you please give us 2 business days to thoroughly review all your results before contacting the office for clarification. Should we see a critical lab result, you will be contacted sooner.   If You Need Anything After Your Visit  If you have any questions or concerns for your doctor, please call our main line at [phone] and press option 4 to reach your doctor's medical assistant. If no one answers, please leave a voicemail as directed and we will return your call as soon as possible. Messages left after 4 pm will be answered the following business day.   You may also send us a message via MyChart. We typically respond to MyChart messages within 1-2 business days.  For prescription refills, please ask your pharmacy to contact our office. Our fax number is [phone].  If you have an urgent issue when the clinic is closed that cannot wait until the next business day, you can page your doctor at the number below.    Please note that while we do our best to be available for urgent issues outside of office hours, we are not available 24/7.   If you have an urgent issue and are unable to reach us, you may choose to seek medical care at your doctor's office, retail clinic, urgent care center, or emergency room.  If you have a medical emergency, please immediately call 911 or go to the emergency department.  Pager Numbers  - Dr. Kowalski: [phone]  - Dr. Moye: [phone]  - Dr. Stewart:  [phone]  In the event of inclement weather, please call our main line at [phone] for an update on the status of any delays or closures.  Dermatology Medication Tips: Please keep the boxes that topical medications come in in order to help keep track of the instructions about where and how to use these. Pharmacies typically print the medication instructions only on the boxes and not directly on the medication tubes.   If your medication is too expensive, please contact our office at [phone] option 4 or send us a message through MyChart.   We are unable to tell what your co-pay for medications will be in advance as this is different depending on your insurance coverage. However, we may be able to find a substitute medication at lower cost or fill out paperwork to get insurance to cover a needed medication.   If a prior authorization is required to get your medication covered by your insurance company, please allow us 1-2 business days to complete this process.  Drug prices often vary depending on where the prescription is filled and some pharmacies may offer cheaper prices.  The website www.goodrx.com contains coupons for medications through different pharmacies. The prices here do not account for what the cost may be with help from insurance (it may be cheaper with your insurance), but the website can give you the price if you did not use any insurance.  - You can print the associated coupon and take it with   your prescription to the pharmacy.  - You may also stop by our office during regular business hours and pick up a GoodRx coupon card.  - If you need your prescription sent electronically to a different pharmacy, notify our office through [facility] MyChart or by phone at [phone] option 4.     Si Usted Necesita Algo Despus de Su Visita  Tambin puede enviarnos un mensaje a travs de MyChart. Por lo general respondemos a los mensajes de MyChart en el transcurso de 1 a 2  das hbiles.  Para renovar recetas, por favor pida a su farmacia que se ponga en contacto con nuestra oficina. Nuestro nmero de fax es el [phone].  Si tiene un asunto urgente cuando la clnica est cerrada y que no puede esperar hasta el siguiente da hbil, puede llamar/localizar a su doctor(a) al nmero que aparece a continuacin.   Por favor, tenga en cuenta que aunque hacemos todo lo posible para estar disponibles para asuntos urgentes fuera del horario de oficina, no estamos disponibles las 24 horas del da, los 7 das de la semana.   Si tiene un problema urgente y no puede comunicarse con nosotros, puede optar por buscar atencin mdica  en el consultorio de su doctor(a), en una clnica privada, en un centro de atencin urgente o en una sala de emergencias.  Si tiene una emergencia mdica, por favor llame inmediatamente al 911 o vaya a la sala de emergencias.  Nmeros de bper  - Dr. Kowalski: [phone]  - Dra. Moye: [phone]  - Dra. Stewart: [phone]  En caso de inclemencias del tiempo, por favor llame a nuestra lnea principal al [phone] para una actualizacin sobre el estado de cualquier retraso o cierre.  Consejos para la medicacin en dermatologa: Por favor, guarde las cajas en las que vienen los medicamentos de uso tpico para ayudarle a seguir las instrucciones sobre dnde y cmo usarlos. Las farmacias generalmente imprimen las instrucciones del medicamento slo en las cajas y no directamente en los tubos del medicamento.   Si su medicamento es muy caro, por favor, pngase en contacto con nuestra oficina llamando al [phone] y presione la opcin 4 o envenos un mensaje a travs de MyChart.   No podemos decirle cul ser su copago por los medicamentos por adelantado ya que esto es diferente dependiendo de la cobertura de su seguro. Sin embargo, es posible que podamos encontrar un medicamento sustituto a menor costo o llenar un formulario para que el  seguro cubra el medicamento que se considera necesario.   Si se requiere una autorizacin previa para que su compaa de seguros cubra su medicamento, por favor permtanos de 1 a 2 das hbiles para completar este proceso.  Los precios de los medicamentos varan con frecuencia dependiendo del lugar de dnde se surte la receta y alguna farmacias pueden ofrecer precios ms baratos.  El sitio web www.goodrx.com tiene cupones para medicamentos de diferentes farmacias. Los precios aqu no tienen en cuenta lo que podra costar con la ayuda del seguro (puede ser ms barato con su seguro), pero el sitio web puede darle el precio si no utiliz ningn seguro.  - Puede imprimir el cupn correspondiente y llevarlo con su receta a la farmacia.  - Tambin puede pasar por nuestra oficina durante el horario de atencin regular y recoger una tarjeta de cupones de GoodRx.  - Si necesita que su receta se enve electrnicamente a una farmacia diferente, informe a nuestra oficina a travs de MyChart de [facility]   o por telfono llamando al [phone] y presione la opcin 4.  

## 2022-12-17 ENCOUNTER — Ambulatory Visit (LOCAL_COMMUNITY_HEALTH_CENTER): Payer: Commercial Managed Care - PPO

## 2022-12-17 DIAGNOSIS — Z111 Encounter for screening for respiratory tuberculosis: Secondary | ICD-10-CM

## 2022-12-20 ENCOUNTER — Ambulatory Visit (LOCAL_COMMUNITY_HEALTH_CENTER): Payer: Commercial Managed Care - PPO

## 2022-12-20 DIAGNOSIS — Z111 Encounter for screening for respiratory tuberculosis: Secondary | ICD-10-CM

## 2022-12-20 LAB — TB SKIN TEST
Induration: 0 mm
TB Skin Test: NEGATIVE

## 2023-01-03 ENCOUNTER — Other Ambulatory Visit: Payer: Self-pay

## 2023-01-28 ENCOUNTER — Other Ambulatory Visit: Payer: Self-pay

## 2023-02-03 ENCOUNTER — Other Ambulatory Visit: Payer: Self-pay

## 2023-02-03 MED FILL — Bupropion HCl Tab ER 24HR 150 MG: ORAL | 90 days supply | Qty: 180 | Fill #1 | Status: AC

## 2023-02-11 ENCOUNTER — Ambulatory Visit
Admission: RE | Admit: 2023-02-11 | Discharge: 2023-02-11 | Disposition: A | Discharge status: Home / Self Care | Payer: Commercial Managed Care - PPO | Source: Ambulatory Visit | Attending: Acute Care | Admitting: Acute Care

## 2023-02-11 DIAGNOSIS — J439 Emphysema, unspecified: Secondary | ICD-10-CM | POA: Insufficient documentation

## 2023-02-11 DIAGNOSIS — Z122 Encounter for screening for malignant neoplasm of respiratory organs: Secondary | ICD-10-CM | POA: Insufficient documentation

## 2023-02-11 DIAGNOSIS — Z87891 Personal history of nicotine dependence: Secondary | ICD-10-CM | POA: Diagnosis not present

## 2023-02-11 DIAGNOSIS — F17210 Nicotine dependence, cigarettes, uncomplicated: Secondary | ICD-10-CM | POA: Insufficient documentation

## 2023-02-13 ENCOUNTER — Other Ambulatory Visit: Payer: Self-pay

## 2023-02-13 DIAGNOSIS — Z122 Encounter for screening for malignant neoplasm of respiratory organs: Secondary | ICD-10-CM

## 2023-02-13 DIAGNOSIS — F17210 Nicotine dependence, cigarettes, uncomplicated: Secondary | ICD-10-CM

## 2023-02-13 DIAGNOSIS — Z87891 Personal history of nicotine dependence: Secondary | ICD-10-CM

## 2023-04-10 ENCOUNTER — Other Ambulatory Visit: Payer: Self-pay | Admitting: Internal Medicine

## 2023-04-10 ENCOUNTER — Other Ambulatory Visit: Payer: Self-pay

## 2023-04-11 ENCOUNTER — Other Ambulatory Visit: Payer: Self-pay

## 2023-04-11 MED ORDER — BUPROPION HCL ER (XL) 150 MG PO TB24
150.0000 mg | ORAL_TABLET | Freq: Two times a day (BID) | ORAL | 1 refills | Status: DC
  Filled 2023-04-11: qty 180, 90d supply, fill #0
  Filled 2023-08-05: qty 180, 90d supply, fill #1

## 2023-04-11 MED ORDER — HYDROCHLOROTHIAZIDE 25 MG PO TABS
25.0000 mg | ORAL_TABLET | Freq: Every day | ORAL | 2 refills | Status: DC
  Filled 2023-04-11: qty 90, 90d supply, fill #0
  Filled 2023-07-12: qty 90, 90d supply, fill #1
  Filled 2023-10-29: qty 90, 90d supply, fill #2

## 2023-04-16 ENCOUNTER — Other Ambulatory Visit: Payer: Self-pay

## 2023-05-23 ENCOUNTER — Other Ambulatory Visit: Payer: Self-pay | Admitting: Internal Medicine

## 2023-05-23 ENCOUNTER — Other Ambulatory Visit: Payer: Self-pay

## 2023-05-25 ENCOUNTER — Other Ambulatory Visit: Payer: Self-pay

## 2023-05-26 ENCOUNTER — Other Ambulatory Visit: Payer: Self-pay

## 2023-05-27 ENCOUNTER — Other Ambulatory Visit: Payer: Self-pay

## 2023-05-27 ENCOUNTER — Telehealth: Payer: Self-pay | Admitting: Internal Medicine

## 2023-05-27 MED FILL — Amlodipine Besylate Tab 2.5 MG (Base Equivalent): ORAL | 90 days supply | Qty: 90 | Fill #0 | Status: AC

## 2023-05-27 NOTE — Telephone Encounter (Signed)
Pt called in asking when was the last time she seen [place], I told her back in July of 2023. Pt booked a CPE with provider for 07/16/23, with a lab appt. It seen like the orders expired, just need to be back in. Pt also would like a refill on Amlodipine.     Prescription Request  05/27/2023  LOV: Visit date not found  What is the name of the medication or equipment? amLODipine (NORVASC) 2.5 MG tablet  Have you contacted your pharmacy to request a refill? Yes   Which pharmacy would you like this sent to?   [facility] [address] [place] [address] Phone: [phone] Fax: [phone]   Patient notified that their request is being sent to the clinical staff for review and that they should receive a response within 2 business days.   Please advise at Mobile [phone] (mobile)

## 2023-05-28 ENCOUNTER — Other Ambulatory Visit: Payer: Self-pay

## 2023-05-28 NOTE — Telephone Encounter (Signed)
Pt scheduled  

## 2023-05-28 NOTE — Telephone Encounter (Signed)
No fasting labs since 02/2022. OK to send in med until her lab appt in sept?

## 2023-05-28 NOTE — Telephone Encounter (Signed)
See if she can come in tomorrow for CPE since open.

## 2023-05-29 ENCOUNTER — Ambulatory Visit: Payer: Commercial Managed Care - PPO | Admitting: Internal Medicine

## 2023-05-29 ENCOUNTER — Other Ambulatory Visit (HOSPITAL_COMMUNITY): Admission: RE | Admit: 2023-05-29 | Payer: Commercial Managed Care - PPO | Source: Ambulatory Visit

## 2023-05-29 ENCOUNTER — Encounter: Payer: Self-pay | Admitting: Internal Medicine

## 2023-05-29 ENCOUNTER — Other Ambulatory Visit: Payer: Self-pay

## 2023-05-29 VITALS — BP 138/74 | HR 75 | Temp 97.9°F | Resp 16 | Ht 71.0 in | Wt 248.0 lb

## 2023-05-29 DIAGNOSIS — I700 Atherosclerosis of aorta: Secondary | ICD-10-CM | POA: Diagnosis not present

## 2023-05-29 DIAGNOSIS — E042 Nontoxic multinodular goiter: Secondary | ICD-10-CM

## 2023-05-29 DIAGNOSIS — N83209 Unspecified ovarian cyst, unspecified side: Secondary | ICD-10-CM

## 2023-05-29 DIAGNOSIS — I10 Essential (primary) hypertension: Secondary | ICD-10-CM | POA: Diagnosis not present

## 2023-05-29 DIAGNOSIS — Z1231 Encounter for screening mammogram for malignant neoplasm of breast: Secondary | ICD-10-CM

## 2023-05-29 DIAGNOSIS — Z124 Encounter for screening for malignant neoplasm of cervix: Secondary | ICD-10-CM

## 2023-05-29 DIAGNOSIS — Z1159 Encounter for screening for other viral diseases: Secondary | ICD-10-CM | POA: Diagnosis not present

## 2023-05-29 DIAGNOSIS — E279 Disorder of adrenal gland, unspecified: Secondary | ICD-10-CM

## 2023-05-29 DIAGNOSIS — Z0000 Encounter for general adult medical examination without abnormal findings: Secondary | ICD-10-CM

## 2023-05-29 DIAGNOSIS — E7800 Pure hypercholesterolemia, unspecified: Secondary | ICD-10-CM | POA: Diagnosis not present

## 2023-05-29 DIAGNOSIS — R059 Cough, unspecified: Secondary | ICD-10-CM | POA: Diagnosis not present

## 2023-05-29 DIAGNOSIS — R87610 Atypical squamous cells of undetermined significance on cytologic smear of cervix (ASC-US): Secondary | ICD-10-CM

## 2023-05-29 DIAGNOSIS — R0982 Postnasal drip: Secondary | ICD-10-CM

## 2023-05-29 DIAGNOSIS — N950 Postmenopausal bleeding: Secondary | ICD-10-CM

## 2023-05-29 DIAGNOSIS — F439 Reaction to severe stress, unspecified: Secondary | ICD-10-CM

## 2023-05-29 DIAGNOSIS — Z114 Encounter for screening for human immunodeficiency virus [HIV]: Secondary | ICD-10-CM

## 2023-05-29 DIAGNOSIS — Z720 Tobacco use: Secondary | ICD-10-CM

## 2023-05-29 DIAGNOSIS — R739 Hyperglycemia, unspecified: Secondary | ICD-10-CM

## 2023-05-29 LAB — HEPATIC FUNCTION PANEL
ALT: 29 U/L (ref 0–35)
AST: 27 U/L (ref 0–37)
Albumin: 4.3 g/dL (ref 3.5–5.2)
Alkaline Phosphatase: 88 U/L (ref 39–117)
Bilirubin, Direct: 0.1 mg/dL (ref 0.0–0.3)
Total Bilirubin: 0.4 mg/dL (ref 0.2–1.2)
Total Protein: 6.8 g/dL (ref 6.0–8.3)

## 2023-05-29 LAB — CBC WITH DIFFERENTIAL/PLATELET
Basophils Absolute: 0 10*3/uL (ref 0.0–0.1)
Basophils Relative: 0.3 % (ref 0.0–3.0)
Eosinophils Absolute: 0.2 10*3/uL (ref 0.0–0.7)
Eosinophils Relative: 3 % (ref 0.0–5.0)
HCT: 41.3 % (ref 36.0–46.0)
Hemoglobin: 13.5 g/dL (ref 12.0–15.0)
Lymphocytes Relative: 33.3 % (ref 12.0–46.0)
Lymphs Abs: 1.9 10*3/uL (ref 0.7–4.0)
MCHC: 32.7 g/dL (ref 30.0–36.0)
MCV: 92.5 fl (ref 78.0–100.0)
Monocytes Absolute: 0.5 10*3/uL (ref 0.1–1.0)
Monocytes Relative: 8.4 % (ref 3.0–12.0)
Neutro Abs: 3.1 10*3/uL (ref 1.4–7.7)
Neutrophils Relative %: 55 % (ref 43.0–77.0)
Platelets: 270 10*3/uL (ref 150.0–400.0)
RBC: 4.46 Mil/uL (ref 3.87–5.11)
RDW: 14.3 % (ref 11.5–15.5)
WBC: 5.6 10*3/uL (ref 4.0–10.5)

## 2023-05-29 LAB — BASIC METABOLIC PANEL
BUN: 12 mg/dL (ref 6–23)
CO2: 28 mEq/L (ref 19–32)
Calcium: 9.4 mg/dL (ref 8.4–10.5)
Chloride: 103 mEq/L (ref 96–112)
Creatinine, Ser: 0.71 mg/dL (ref 0.40–1.20)
GFR: 95.28 mL/min (ref >60.00)
Glucose, Bld: 90 mg/dL (ref 70–99)
Potassium: 4.9 mEq/L (ref 3.5–5.1)
Sodium: 140 mEq/L (ref 135–145)

## 2023-05-29 LAB — HEMOGLOBIN A1C: Hgb A1c MFr Bld: 6.1 % (ref 4.6–6.5)

## 2023-05-29 LAB — LIPID PANEL
Cholesterol: 211 mg/dL — ABNORMAL HIGH (ref 0–200)
HDL: 74.3 mg/dL (ref >39.00)
LDL Cholesterol: 124 mg/dL — ABNORMAL HIGH (ref 0–99)
NonHDL: 137.04
Total CHOL/HDL Ratio: 3
Triglycerides: 63 mg/dL (ref 0.0–149.0)
VLDL: 12.6 mg/dL (ref 0.0–40.0)

## 2023-05-29 LAB — T4, FREE: Free T4: 0.88 ng/dL (ref 0.60–1.60)

## 2023-05-29 LAB — TSH: TSH: 0.59 u[IU]/mL (ref 0.35–5.50)

## 2023-05-29 NOTE — Assessment & Plan Note (Signed)
Low carb diet and exercise.  Follow met b and a1c.  

## 2023-05-29 NOTE — Assessment & Plan Note (Signed)
Lung screening CT chest - 01/2023 - Lung-RADS 2, benign appearance or behavior. Continue annual screening with low-dose chest CT without contrast in 12 months. Emphysema.

## 2023-05-29 NOTE — Assessment & Plan Note (Signed)
On pravastatin.  Low cholesterol diet and exercise.  Follow lipid panel.   

## 2023-05-29 NOTE — Assessment & Plan Note (Signed)
Saw Dr Jean Rosenthal - 05/2022 - monitor.  F/u pelvic ultrasound 3-4 months. Did not f/u.  Refer back for f/u.

## 2023-05-29 NOTE — Progress Notes (Signed)
Subjective:    [name], female    DOB: 1966-03-22, 57 y.o.   MRN: [id number]  Patient here for  Chief Complaint  Patient presents with   Annual Exam    HPI Here for a physical exam.  Evaluated 06/19/22 - GYN - post menopausal bleeding.  Per note, no further w/up at that time.  Also f/u ovarian cyst. Had recommended f/u in 4 weeks. She did not keep appt.  Discussed today  regarding f/u.  Discussed checking CA 125.  Wants to hold on CA 125, but is agreeable for referral back.  No chest pain reported. No sob.  Still smoking.  Discussed.  She does report that over the last week she has noticed increased drainage and some congestion. Some cough related to the drainage.  No chest congestion.  No sob.  Taking antihistamine daily.  Also started mucinex DM. No acid reflux.  Follow up thyroid ultrasound. Saw endocrinology 07/2022 - multinodular goiter. S/p benign FNA 2011. Recommended thyroid ultrasound.  Did not f/u.  Wants to f/u in [place] - instead of having to travel.    Past Medical History:  Diagnosis Date   Hyperlipidemia    Hypertension    Past Surgical History:  Procedure Laterality Date   VAGINAL DELIVERY  1989   episiotomy, Dr. Willeen Cass   Family History  Problem Relation Age of Onset   Diabetes Mother    Dementia Mother    Stroke Father 68   Hypertension Sister    Thyroid disease Sister    Diabetes Sister    Hypertension Brother    Diabetes Brother    Hypertension Sister    Hypertension Brother    Hypertension Brother    Hypertension Brother    Hypertension Sister    Breast cancer Neg Hx    Lupus Neg Hx    Social History   Socioeconomic History   Marital status: Single    Spouse name: Not on file   Number of children: Not on file   Years of education: Not on file   Highest education level: Not on file  Occupational History   Not on file  Tobacco Use   Smoking status: Some Days    Current packs/day: 0.50    Average packs/day: 0.5  packs/day for 15.0 years (7.5 ttl pk-yrs)    Types: Cigarettes   Smokeless tobacco: Never  Vaping Use   Vaping status: Never Used  Substance and Sexual Activity   Alcohol use: Yes    Alcohol/week: 0.0 standard drinks of alcohol    Comment: 2-3 times a week per patient   Drug use: No   Sexual activity: Yes    Birth control/protection: Implant  Other Topics Concern   Not on file  Social History Narrative   Lives in [place]. 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 2 grandchildren, 1 son.      Diet: regular diet, limits fried food   Exercise: walks goal daily   Social Determinants of Health   Financial Resource Strain: Not on file  Food Insecurity: Not on file  Transportation Needs: Not on file  Physical Activity: Not on file  Stress: Not on file  Social Connections: Not on file     Review of Systems  Constitutional:  Negative for appetite change and fever.  HENT:  Positive for congestion and postnasal drip. Negative for sinus pressure and sore throat.   Eyes:  Negative for pain and visual disturbance.  Respiratory:  Negative  for cough, chest tightness and shortness of breath.   Cardiovascular:  Negative for chest pain, palpitations and leg swelling.  Gastrointestinal:  Negative for abdominal pain, diarrhea, nausea and vomiting.  Genitourinary:  Negative for difficulty urinating and dysuria.  Musculoskeletal:  Negative for joint swelling and myalgias.  Skin:  Negative for color change and rash.  Neurological:  Negative for dizziness and headaches.  Hematological:  Negative for adenopathy. Does not bruise/bleed easily.  Psychiatric/Behavioral:  Negative for agitation and dysphoric mood.        Objective:     BP 138/74   Pulse 75   Temp 97.9 F (36.6 C)   Resp 16   Ht 5\' 11"  (1.803 m)   Wt 248 lb (112.5 kg)   SpO2 97%   BMI 34.59 kg/m  Wt Readings from Last 3 Encounters:  05/29/23 248 lb (112.5 kg)  07/24/22 247 lb (112 kg)  05/14/22 246 lb 3.2 oz  (111.7 kg)    Physical Exam Vitals reviewed.  Constitutional:      General: She is not in acute distress.    Appearance: Normal appearance. She is well-developed.  HENT:     Head: Normocephalic and atraumatic.     Right Ear: External ear normal.     Left Ear: External ear normal.  Eyes:     General: No scleral icterus.       Right eye: No discharge.        Left eye: No discharge.     Conjunctiva/sclera: Conjunctivae normal.  Neck:     Thyroid: No thyromegaly.  Cardiovascular:     Rate and Rhythm: Normal rate and regular rhythm.  Pulmonary:     Effort: No tachypnea, accessory muscle usage or respiratory distress.     Breath sounds: Normal breath sounds. No decreased breath sounds or wheezing.  Chest:  Breasts:    Right: No inverted nipple, mass, nipple discharge or tenderness (no axillary adenopathy).     Left: No inverted nipple, mass, nipple discharge or tenderness (no axilarry adenopathy).  Abdominal:     General: Bowel sounds are normal.     Palpations: Abdomen is soft.     Tenderness: There is no abdominal tenderness.  Genitourinary:    Comments: Normal external genitalia.  Vaginal vault without lesions.  Cervix identified.  Pap smear performed.  Could not appreciate any adnexal masses or tenderness.   Musculoskeletal:        General: No swelling or tenderness.     Cervical back: Neck supple.  Lymphadenopathy:     Cervical: No cervical adenopathy.  Skin:    Findings: No erythema or rash.  Neurological:     Mental Status: She is alert and oriented to person, place, and time.  Psychiatric:        Mood and Affect: Mood normal.        Behavior: Behavior normal.      Outpatient Encounter Medications as of 05/29/2023  Medication Sig   amLODipine (NORVASC) 2.5 MG tablet Take 1 tablet (2.5 mg total) by mouth daily.   B Complex Vitamins (B COMPLEX 100 PO) Take by mouth.   buPROPion (WELLBUTRIN XL) 150 MG 24 hr tablet Take 1 tablet (150 mg total) by mouth 2 (two) times  daily.   hydrochlorothiazide (HYDRODIURIL) 25 MG tablet Take 1 tablet (25 mg total) by mouth daily.   Multiple Vitamin (MULTIVITAMIN WITH MINERALS) TABS tablet Take 1 tablet by mouth daily.   Probiotic Product (PROBIOTIC DAILY PO) Take by mouth.  Ruxolitinib Phosphate (OPZELURA) 1.5 % CREA Apply 1 application  topically daily. Apply to face daily   Turmeric (QC TUMERIC COMPLEX PO) Take by mouth.   [DISCONTINUED] mupirocin ointment (BACTROBAN) 2 % Apply 1 Application topically 2 (two) times daily. (Patient not taking: Reported on 12/17/2022)   [DISCONTINUED] pravastatin (PRAVACHOL) 20 MG tablet Take 1 tablet (20 mg total) by mouth daily.   No facility-administered encounter medications on file as of 05/29/2023.     Lab Results  Component Value Date   WBC 5.6 05/29/2023   HGB 13.5 05/29/2023   HCT 41.3 05/29/2023   PLT 270.0 05/29/2023   GLUCOSE 90 05/29/2023   CHOL 211 (H) 05/29/2023   TRIG 63.0 05/29/2023   HDL 74.30 05/29/2023   LDLCALC 124 (H) 05/29/2023   ALT 29 05/29/2023   AST 27 05/29/2023   NA 140 05/29/2023   K 4.9 05/29/2023   CL 103 05/29/2023   CREATININE 0.71 05/29/2023   BUN 12 05/29/2023   CO2 28 05/29/2023   TSH 0.59 05/29/2023   HGBA1C 6.1 05/29/2023    CT CHEST LUNG CA SCREEN LOW DOSE W/O CM  Result Date: 02/13/2023 CLINICAL DATA:  57 year old female with 29 pack-year history of smoking. Lung cancer screening. EXAM: CT CHEST WITHOUT CONTRAST LOW-DOSE FOR LUNG CANCER SCREENING TECHNIQUE: Multidetector CT imaging of the chest was performed following the standard protocol without IV contrast. RADIATION DOSE REDUCTION: This exam was performed according to the departmental dose-optimization program which includes automated exposure control, adjustment of the mA and/or kV according to patient size and/or use of iterative reconstruction technique. COMPARISON:  02/07/2022 FINDINGS: Cardiovascular: The heart size is normal. No substantial pericardial effusion. No thoracic  aortic aneurysm. No substantial atherosclerosis of the thoracic aorta. Mediastinum/Nodes: No mediastinal lymphadenopathy. Similar appearance bilateral thyroid nodules. This has been evaluated on previous imaging. (ref: J Am Coll Radiol. 2015 Feb;12(2): 143-50).No evidence for gross hilar lymphadenopathy although assessment is limited by the lack of intravenous contrast on the current study. The esophagus has normal imaging features. There is no axillary lymphadenopathy. Lungs/Pleura: Centrilobular emphsyema noted. Stable right lower lobe pulmonary nodule. There is no new suspicious pulmonary nodule or mass. Subsegmental atelectasis noted in the lower lungs bilaterally. No pleural effusion. Upper Abdomen: Tiny left adrenal nodules described previously are stable, consistent with benign etiology such as adenomas. No followup imaging is recommended. Visualized portion of the upper abdomen is otherwise unremarkable. Musculoskeletal: No worrisome lytic or sclerotic osseous abnormality. IMPRESSION: 1. Lung-RADS 2, benign appearance or behavior. Continue annual screening with low-dose chest CT without contrast in 12 months. 2.  Emphysema. ([id number]) Electronically Signed   By: Kennith Center M.D.   On: 02/13/2023 09:15      Assessment & Plan:  Routine general medical examination at a health care facility  Health care maintenance Assessment & Plan: Physical today 05/29/23.  PAP 04/2022 - negative with negative HPV. Repeat pap today.  colonoscopy 11/2017 - recommended f/u in 10 years.  Mammogram 08/22/22 - Birads I.    Primary hypertension Assessment & Plan: On hctz and amlodipine. Blood pressure as outlined.  Follow pressures. Follow metabolic panel.   Orders: -     Basic metabolic panel  Hyperglycemia Assessment & Plan: Low carb diet and exercise.  Follow met b and a1c.   Orders: -     Hemoglobin A1c  Hypercholesterolemia Assessment & Plan: On pravastatin.  Low cholesterol diet and exercise.   Follow lipid panel.    Orders: -  CBC with Differential/Platelet -     Hepatic function panel -     Lipid panel  Multinodular goiter -     TSH -     T4, free -     Ambulatory referral to Endocrinology  Screening for cervical cancer -     Cytology - PAP  Screening for HIV without presence of risk factors -     HIV Antibody (routine testing w rflx)  Encounter for hepatitis C screening test for low risk patient -     Hepatitis C antibody  Visit for screening mammogram -     3D Screening Mammogram, Left and Right; Future  Cough, unspecified type -     POC COVID-19 BinaxNow  Aortic atherosclerosis (HCC) Assessment & Plan: Continue pravastatin.    Atypical squamous cells of undetermined significance on cytologic smear of cervix (ASC-US) Assessment & Plan: PAP 04/2022 - negative.  Repeat pap today.    Lesion of adrenal gland [facility]) Assessment & Plan: Two nodules noted on chest CT 01/2022.  Will have endocrinology f/u as well.   Orders: -     Ambulatory referral to Endocrinology  Cyst of ovary, unspecified laterality Assessment & Plan: Saw Dr Jean Rosenthal 05/2022.  Recommended f/u ultrasound in 3-4 months. Did not keep appt.  Refer back for f/u.   Orders: -     Ambulatory referral to Gynecology  Postmenopausal bleeding Assessment & Plan: Saw Dr Jean Rosenthal - 05/2022 - monitor.  F/u pelvic ultrasound 3-4 months. Did not f/u.  Refer back for f/u.   Orders: -     Ambulatory referral to Gynecology  Stress Assessment & Plan: On wellbutrin.  Overall appears to be handling things relatively well.  Has good support.  Follow.    Tobacco use Assessment & Plan: Lung screening CT chest - 01/2023 - Lung-RADS 2, benign appearance or behavior. Continue annual screening with low-dose chest CT without contrast in 12 months. Emphysema.    Post-nasal drainage Assessment & Plan: POC covid test - negative.  Start antihistamine daily.  Steroid nasal spray.  Follow.        Dale Wapello, MD

## 2023-05-29 NOTE — Assessment & Plan Note (Signed)
On wellbutrin.  Overall appears to be handling things relatively well.  Has good support.  Follow.  

## 2023-05-29 NOTE — Assessment & Plan Note (Signed)
PAP 04/2022 - negative.  Repeat pap today.

## 2023-05-29 NOTE — Assessment & Plan Note (Signed)
Saw Dr Jean Rosenthal 05/2022.  Recommended f/u ultrasound in 3-4 months. Did not keep appt.  Refer back for f/u.

## 2023-05-29 NOTE — Assessment & Plan Note (Signed)
Two nodules noted on chest CT 01/2022.  Will have endocrinology f/u as well.

## 2023-05-29 NOTE — Assessment & Plan Note (Signed)
Physical today 05/29/23.  PAP 04/2022 - negative with negative HPV. Repeat pap today.  colonoscopy 11/2017 - recommended f/u in 10 years.  Mammogram 08/22/22 - Birads I.

## 2023-05-29 NOTE — Assessment & Plan Note (Signed)
On hctz and amlodipine. Blood pressure as outlined.  Follow pressures. Follow metabolic panel.

## 2023-05-29 NOTE — Assessment & Plan Note (Signed)
Continue pravastatin 

## 2023-05-30 ENCOUNTER — Other Ambulatory Visit: Payer: Self-pay

## 2023-05-30 MED ORDER — PRAVASTATIN SODIUM 40 MG PO TABS
40.0000 mg | ORAL_TABLET | Freq: Every day | ORAL | 3 refills | Status: DC
  Filled 2023-05-30 – 2023-06-27 (×2): qty 90, 90d supply, fill #0
  Filled 2023-10-01: qty 90, 90d supply, fill #1

## 2023-06-01 ENCOUNTER — Encounter: Payer: Self-pay | Admitting: Internal Medicine

## 2023-06-01 DIAGNOSIS — R0982 Postnasal drip: Secondary | ICD-10-CM | POA: Insufficient documentation

## 2023-06-01 NOTE — Assessment & Plan Note (Signed)
POC covid test - negative.  Start antihistamine daily.  Steroid nasal spray.  Follow.

## 2023-06-02 ENCOUNTER — Telehealth: Payer: Self-pay | Admitting: Internal Medicine

## 2023-06-02 LAB — POC COVID19 BINAXNOW: SARS Coronavirus 2 Ag: NEGATIVE

## 2023-06-02 NOTE — Addendum Note (Signed)
Addended by: Rita Ohara D on: 06/02/2023 07:20 AM   Modules accepted: Orders

## 2023-06-02 NOTE — Telephone Encounter (Signed)
Lft pt vm to call ofc regarding prior endo referral.

## 2023-06-18 ENCOUNTER — Other Ambulatory Visit: Payer: Self-pay

## 2023-06-27 ENCOUNTER — Other Ambulatory Visit: Payer: Self-pay

## 2023-07-11 ENCOUNTER — Other Ambulatory Visit: Payer: Commercial Managed Care - PPO

## 2023-07-15 ENCOUNTER — Other Ambulatory Visit: Payer: Self-pay

## 2023-07-15 MED ORDER — COMIRNATY 30 MCG/0.3ML IM SUSY
0.3000 mL | PREFILLED_SYRINGE | INTRAMUSCULAR | 0 refills | Status: DC
  Filled 2023-07-15: qty 0.3, 1d supply, fill #0

## 2023-07-15 MED ORDER — FLULAVAL 0.5 ML IM SUSY
0.5000 mL | PREFILLED_SYRINGE | INTRAMUSCULAR | 0 refills | Status: DC
  Filled 2023-07-15: qty 0.5, 1d supply, fill #0

## 2023-07-16 ENCOUNTER — Encounter: Payer: Commercial Managed Care - PPO | Admitting: Internal Medicine

## 2023-07-29 ENCOUNTER — Ambulatory Visit: Payer: 59 | Admitting: Internal Medicine

## 2023-08-13 ENCOUNTER — Other Ambulatory Visit: Payer: Self-pay

## 2023-08-13 DIAGNOSIS — D3502 Benign neoplasm of left adrenal gland: Secondary | ICD-10-CM | POA: Diagnosis not present

## 2023-08-13 DIAGNOSIS — E042 Nontoxic multinodular goiter: Secondary | ICD-10-CM | POA: Diagnosis not present

## 2023-08-13 MED ORDER — DEXAMETHASONE 1 MG PO TABS
1.0000 mg | ORAL_TABLET | Freq: Once | ORAL | 0 refills | Status: AC
  Filled 2023-08-13: qty 1, 1d supply, fill #0

## 2023-08-22 MED FILL — Amlodipine Besylate Tab 2.5 MG (Base Equivalent): ORAL | 90 days supply | Qty: 90 | Fill #1 | Status: AC

## 2023-08-26 ENCOUNTER — Ambulatory Visit
Admission: RE | Admit: 2023-08-26 | Discharge: 2023-08-26 | Disposition: A | Discharge status: Home / Self Care | Payer: Commercial Managed Care - PPO | Source: Ambulatory Visit | Attending: Internal Medicine | Admitting: Internal Medicine

## 2023-08-26 DIAGNOSIS — Z1231 Encounter for screening mammogram for malignant neoplasm of breast: Secondary | ICD-10-CM | POA: Insufficient documentation

## 2023-09-03 ENCOUNTER — Other Ambulatory Visit: Payer: Self-pay

## 2023-09-03 ENCOUNTER — Telehealth: Payer: Commercial Managed Care - PPO | Admitting: Internal Medicine

## 2023-09-03 ENCOUNTER — Encounter: Payer: Self-pay | Admitting: Internal Medicine

## 2023-09-03 VITALS — Ht 71.0 in | Wt 248.0 lb

## 2023-09-03 DIAGNOSIS — E0789 Other specified disorders of thyroid: Secondary | ICD-10-CM

## 2023-09-03 DIAGNOSIS — N83209 Unspecified ovarian cyst, unspecified side: Secondary | ICD-10-CM | POA: Diagnosis not present

## 2023-09-03 DIAGNOSIS — I700 Atherosclerosis of aorta: Secondary | ICD-10-CM

## 2023-09-03 DIAGNOSIS — F439 Reaction to severe stress, unspecified: Secondary | ICD-10-CM

## 2023-09-03 DIAGNOSIS — R739 Hyperglycemia, unspecified: Secondary | ICD-10-CM | POA: Diagnosis not present

## 2023-09-03 DIAGNOSIS — E7800 Pure hypercholesterolemia, unspecified: Secondary | ICD-10-CM

## 2023-09-03 DIAGNOSIS — E279 Disorder of adrenal gland, unspecified: Secondary | ICD-10-CM

## 2023-09-03 DIAGNOSIS — I10 Essential (primary) hypertension: Secondary | ICD-10-CM | POA: Diagnosis not present

## 2023-09-03 NOTE — Progress Notes (Signed)
[name], female   DOB: 04-12-1966, 57 y.o.   MRN: [id number]   Virtual Visit via video Note  I connected with Paula Wallace by a video enabled telemedicine application and verified that I am speaking with the correct person using two identifiers. Location patient: home Location provider: work Persons participating in the virtual visit: patient, provider  The limitations, risks, security and privacy concerns of performing an evaluation and management service by video and the availability of in person appointments have been discussed.  It has also been discussed with the patient that there may be a patient responsible charge related to this service. The patient expressed understanding and agreed to proceed.  Reason for visit: follow up appt  HPI: Follow up appt - f/u regarding hypercholesterolemia, hyperglycemia and hypertension. Seeing endocrinology - 08/13/23 - f/u multinodular goiter and left adrenal adenoma. Recommended f/u thyroid ultrasound and dexamethasone suppression test. Ultrasound scheduled for next week.  Overall she feels she is doing relatively well.  Blowing nose. Post nasal drainage - better. No increased chest congestion or cough. No sob reported.    ROS: See pertinent positives and negatives per HPI.  Past Medical History:  Diagnosis Date   Hyperlipidemia    Hypertension     Past Surgical History:  Procedure Laterality Date   VAGINAL DELIVERY  1989   episiotomy, Dr. Willeen Cass    Family History  Problem Relation Age of Onset   Diabetes Mother    Dementia Mother    Stroke Father 24   Hypertension Sister    Thyroid disease Sister    Diabetes Sister    Hypertension Brother    Diabetes Brother    Hypertension Sister    Hypertension Brother    Hypertension Brother    Hypertension Brother    Hypertension Sister    Breast cancer Neg Hx    Lupus Neg Hx     SOCIAL HX: reviewed.    Current Outpatient Medications:    amLODipine  (NORVASC) 2.5 MG tablet, Take 1 tablet (2.5 mg total) by mouth daily., Disp: 90 tablet, Rfl: 1   B Complex Vitamins (B COMPLEX 100 PO), Take by mouth., Disp: , Rfl:    buPROPion (WELLBUTRIN XL) 150 MG 24 hr tablet, Take 1 tablet (150 mg total) by mouth 2 (two) times daily., Disp: 180 tablet, Rfl: 1   hydrochlorothiazide (HYDRODIURIL) 25 MG tablet, Take 1 tablet (25 mg total) by mouth daily., Disp: 90 tablet, Rfl: 2   Multiple Vitamin (MULTIVITAMIN WITH MINERALS) TABS tablet, Take 1 tablet by mouth daily., Disp: , Rfl:    pravastatin (PRAVACHOL) 40 MG tablet, Take 1 tablet (40 mg total) by mouth daily., Disp: 90 tablet, Rfl: 3   Probiotic Product (PROBIOTIC DAILY PO), Take by mouth., Disp: , Rfl:    Ruxolitinib Phosphate (OPZELURA) 1.5 % CREA, Apply 1 application  topically daily. Apply to face daily, Disp: 60 g, Rfl: 2  EXAM:  GENERAL: alert, oriented, appears well and in no acute distress  HEENT: atraumatic, conjunttiva clear, no obvious abnormalities on inspection of external nose and ears  NECK: normal movements of the head and neck  LUNGS: on inspection no signs of respiratory distress, breathing rate appears normal, no obvious gross SOB, gasping or wheezing  CV: no obvious cyanosis  PSYCH/NEURO: pleasant and cooperative, no obvious depression or anxiety, speech and thought processing grossly intact  ASSESSMENT AND PLAN:  Discussed the following assessment and plan:  Problem List Items Addressed This Visit  Aortic atherosclerosis (HCC)    Continue pravastatin.       Hypercholesterolemia    On pravastatin.  Low cholesterol diet and exercise.  Follow lipid panel.        Hyperglycemia    Low carb diet and exercise.  Follow met b and a1c.       Hypertension    On hctz and amlodipine. Follow pressures. Follow metabolic panel.       Lesion of adrenal gland [facility])    Endocrinology - 07/2023 - plan dexamethasone suppression test. Scheduled for this week.       Ovarian  cyst    Saw Dr Jean Rosenthal 05/2022.  Recommended f/u ultrasound in 3-4 months. See if agreeable for referral back to gyn for f/u.       Stress    On wellbutrin.  Overall appears to be handling things relatively well.  Has good support.  Follow.       Thyroid fullness - Primary    Had f/u with endocrinology 07/2023 - planning for f/u thyroid ultrasound next week.        Return in about 3 months (around 12/04/2023) for follow-up with fasting labs 2 days prior.   I discussed the assessment and treatment plan with the patient. The patient was provided an opportunity to ask questions and all were answered. The patient agreed with the plan and demonstrated an understanding of the instructions.   The patient was advised to call back or seek an in-person evaluation if the symptoms worsen or if the condition fails to improve as anticipated.    Dale Fisher, MD

## 2023-09-05 DIAGNOSIS — D3502 Benign neoplasm of left adrenal gland: Secondary | ICD-10-CM | POA: Diagnosis not present

## 2023-09-07 ENCOUNTER — Encounter: Payer: Self-pay | Admitting: Internal Medicine

## 2023-09-07 ENCOUNTER — Telehealth: Payer: Self-pay | Admitting: Internal Medicine

## 2023-09-07 NOTE — Assessment & Plan Note (Signed)
Endocrinology - 07/2023 - plan dexamethasone suppression test. Scheduled for this week.

## 2023-09-07 NOTE — Assessment & Plan Note (Signed)
Low carb diet and exercise.  Follow met b and a1c.   

## 2023-09-07 NOTE — Assessment & Plan Note (Signed)
On pravastatin.  Low cholesterol diet and exercise.  Follow lipid panel.   

## 2023-09-07 NOTE — Telephone Encounter (Signed)
Please notify Annice Pih that in reviewing her chart, she previously saw Dr Jean Rosenthal (gyn) - f/u ovarian cyst. He had recommended a f/u pelvic ultrasound.  It appears this has not been done.  See if she is agreeable for f/u with him to f/u ovarian cyst.  (Dr Jean Rosenthal [facility])

## 2023-09-07 NOTE — Assessment & Plan Note (Signed)
Saw Dr Jean Rosenthal 05/2022.  Recommended f/u ultrasound in 3-4 months. See if agreeable for referral back to gyn for f/u.

## 2023-09-07 NOTE — Assessment & Plan Note (Signed)
Continue pravastatin 

## 2023-09-07 NOTE — Assessment & Plan Note (Signed)
On hctz and amlodipine. Follow pressures. Follow metabolic panel.

## 2023-09-07 NOTE — Assessment & Plan Note (Signed)
Had f/u with endocrinology 07/2023 - planning for f/u thyroid ultrasound next week.

## 2023-09-07 NOTE — Assessment & Plan Note (Signed)
On wellbutrin.  Overall appears to be handling things relatively well.  Has good support.  Follow.  

## 2023-09-08 NOTE — Telephone Encounter (Signed)
Left message for patient to call office and schedule a 56m follow up with Dr Lorin Picket and fasting labs 2 days prior to appt.

## 2023-09-08 NOTE — Telephone Encounter (Signed)
Lvm for pt to give office a call back to schedule an appt as well Dr. Roby Lofts recommendations below.

## 2023-09-09 DIAGNOSIS — E042 Nontoxic multinodular goiter: Secondary | ICD-10-CM | POA: Diagnosis not present

## 2023-09-11 ENCOUNTER — Encounter: Payer: Self-pay | Admitting: *Deleted

## 2023-09-11 NOTE — Telephone Encounter (Signed)
Lvm for pt to give office a call back

## 2023-09-11 NOTE — Telephone Encounter (Signed)
Pt would like to be called regarding an GYN appointment

## 2023-09-16 NOTE — Telephone Encounter (Signed)
Noted  

## 2023-09-16 NOTE — Telephone Encounter (Signed)
Patient states she is returning a call from Rita Ohara, LPN.  I was unable to reach [place] at the time of the call.  Patient states right now she has a thyroid biopsy in January, so she would like to wait on pelvic ultrasound.

## 2023-09-16 NOTE — Telephone Encounter (Signed)
LM to let patient know to return our call or check my chart

## 2023-10-01 ENCOUNTER — Other Ambulatory Visit: Payer: Self-pay

## 2023-10-06 ENCOUNTER — Telehealth: Payer: Self-pay

## 2023-10-06 NOTE — Telephone Encounter (Signed)
Patient states she is on pravastatin (PRAVACHOL) 40 MG tablet and she has noticed that her legs feel weighted and heavy and she has a pain that is in the front of her thigh on her right leg.  Patient states this started in October, but seems like it is getting worse.  Patient states leg is not red or hot to the touch.  Patient states the pain is tolerable but it just slows her down.  Patient states she would like to stop taking her pravastatin for a week to see if her symptoms will go away.  Patient states she was taking 20 MG and then we increased it to 40 MG and she is thinking this may be the problem.  Patient states we may leave her a voicemail if we don't get her in person.  Patient states she took this medication yesterday morning, but she has not had it today.

## 2023-10-06 NOTE — Telephone Encounter (Signed)
Ok for her to stop cholesterol med and see if symptoms with her legs resolve?

## 2023-10-06 NOTE — Telephone Encounter (Signed)
Pt returning call

## 2023-10-06 NOTE — Telephone Encounter (Signed)
Ok to hold pravastatin and call us with update in 2 weeks. Confirm no swelling of legs.  Any persistent problems or acute problems, will need to be seen.

## 2023-10-06 NOTE — Telephone Encounter (Signed)
LMTCB

## 2023-10-07 NOTE — Telephone Encounter (Signed)
Noted,

## 2023-10-07 NOTE — Telephone Encounter (Signed)
LMTCB. When patient returns call please give her message from Dr Lorin Picket below.

## 2023-10-07 NOTE — Telephone Encounter (Signed)
Patient states she is returning a call from Rita Ohara, LPN.  I read Dr. Westley Hummer Scott's message to patient.  Patient states she has heaviness in her legs, but they are not swollen.  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  Patient states she stands off and on for her job.  Patient states her legs are not swollen more than normal.  Patient states she will stop taking the pravastatin for 2 weeks and call us with an update.

## 2023-10-29 ENCOUNTER — Other Ambulatory Visit: Payer: Self-pay

## 2023-10-29 DIAGNOSIS — E042 Nontoxic multinodular goiter: Secondary | ICD-10-CM | POA: Diagnosis not present

## 2023-11-03 DIAGNOSIS — E042 Nontoxic multinodular goiter: Secondary | ICD-10-CM | POA: Diagnosis not present

## 2023-11-13 ENCOUNTER — Ambulatory Visit: Payer: Commercial Managed Care - PPO | Admitting: Dermatology

## 2023-11-23 ENCOUNTER — Other Ambulatory Visit: Payer: Self-pay | Admitting: Internal Medicine

## 2023-11-24 ENCOUNTER — Other Ambulatory Visit: Payer: Self-pay

## 2023-11-24 ENCOUNTER — Other Ambulatory Visit: Payer: Self-pay | Admitting: Internal Medicine

## 2023-11-24 MED FILL — Amlodipine Besylate Tab 2.5 MG (Base Equivalent): ORAL | 90 days supply | Qty: 90 | Fill #0 | Status: AC

## 2023-11-24 MED FILL — Bupropion HCl Tab ER 24HR 150 MG: ORAL | 90 days supply | Qty: 180 | Fill #0 | Status: AC

## 2023-11-24 MED FILL — Hydrochlorothiazide Tab 25 MG: ORAL | 90 days supply | Qty: 90 | Fill #0 | Status: CN

## 2023-11-25 ENCOUNTER — Other Ambulatory Visit (HOSPITAL_COMMUNITY): Payer: Self-pay

## 2023-11-25 ENCOUNTER — Other Ambulatory Visit: Payer: Self-pay

## 2023-12-09 DIAGNOSIS — H524 Presbyopia: Secondary | ICD-10-CM | POA: Diagnosis not present

## 2023-12-17 ENCOUNTER — Ambulatory Visit: Payer: Commercial Managed Care - PPO | Admitting: Internal Medicine

## 2023-12-17 ENCOUNTER — Encounter: Payer: Self-pay | Admitting: Internal Medicine

## 2023-12-17 VITALS — BP 128/72 | HR 75 | Temp 98.0°F | Ht 71.0 in | Wt 250.6 lb

## 2023-12-17 DIAGNOSIS — F439 Reaction to severe stress, unspecified: Secondary | ICD-10-CM | POA: Diagnosis not present

## 2023-12-17 DIAGNOSIS — I700 Atherosclerosis of aorta: Secondary | ICD-10-CM

## 2023-12-17 DIAGNOSIS — R232 Flushing: Secondary | ICD-10-CM | POA: Insufficient documentation

## 2023-12-17 DIAGNOSIS — Z720 Tobacco use: Secondary | ICD-10-CM

## 2023-12-17 DIAGNOSIS — R87610 Atypical squamous cells of undetermined significance on cytologic smear of cervix (ASC-US): Secondary | ICD-10-CM | POA: Diagnosis not present

## 2023-12-17 DIAGNOSIS — E7800 Pure hypercholesterolemia, unspecified: Secondary | ICD-10-CM | POA: Diagnosis not present

## 2023-12-17 DIAGNOSIS — E279 Disorder of adrenal gland, unspecified: Secondary | ICD-10-CM | POA: Diagnosis not present

## 2023-12-17 DIAGNOSIS — E0789 Other specified disorders of thyroid: Secondary | ICD-10-CM | POA: Diagnosis not present

## 2023-12-17 DIAGNOSIS — N83209 Unspecified ovarian cyst, unspecified side: Secondary | ICD-10-CM | POA: Diagnosis not present

## 2023-12-17 DIAGNOSIS — R739 Hyperglycemia, unspecified: Secondary | ICD-10-CM | POA: Diagnosis not present

## 2023-12-17 DIAGNOSIS — I10 Essential (primary) hypertension: Secondary | ICD-10-CM | POA: Diagnosis not present

## 2023-12-17 NOTE — Assessment & Plan Note (Signed)
Evaluated by endocrinology

## 2023-12-17 NOTE — Assessment & Plan Note (Signed)
 Saw endocrinology. S/p biopsy - per report - ok.

## 2023-12-17 NOTE — Assessment & Plan Note (Signed)
 Low-carb diet and exercise.  Follow met b and A1c.

## 2023-12-17 NOTE — Assessment & Plan Note (Signed)
 PAP 04/2022 - negative.  F/u PAP 05/29/23 - negative with negative HPV.

## 2023-12-17 NOTE — Progress Notes (Signed)
 Subjective:    [name], female    DOB: Apr 11, 1966, 58 y.o.   MRN: [id number]  Patient here for  Chief Complaint  Patient presents with   Medical Management of Chronic Issues    HPI Here for a scheduled follow up - follow up regarding hypercholesterolemia, hyperglycemia and hypertension. Recently evaluated by Dr Gershon Crane for thyroid nodule. Biopsy 10/29/23 - per her report - ok. She has started recently exercising. Going to the gym. Getting 10,000 steps per day. No chest pain or sob reported. She has started omega red. Off pravastatin due to intolerance. Legs feel better. Discussed f/u regarding ovarian cyst and pelvic ultrasound. She wants to hold on further testing at this time. Does report increased hot flashes - issue day and night. No period/vaginal bleeding for years.    Past Medical History:  Diagnosis Date   Hyperlipidemia    Hypertension    Past Surgical History:  Procedure Laterality Date   VAGINAL DELIVERY  1989   episiotomy, Dr. Willeen Cass   Family History  Problem Relation Age of Onset   Diabetes Mother    Dementia Mother    Stroke Father 72   Hypertension Sister    Thyroid disease Sister    Diabetes Sister    Hypertension Brother    Diabetes Brother    Hypertension Sister    Hypertension Brother    Hypertension Brother    Hypertension Brother    Hypertension Sister    Breast cancer Neg Hx    Lupus Neg Hx    Social History   Socioeconomic History   Marital status: Single    Spouse name: Not on file   Number of children: Not on file   Years of education: Not on file   Highest education level: Not on file  Occupational History   Not on file  Tobacco Use   Smoking status: Some Days    Current packs/day: 0.50    Average packs/day: 0.5 packs/day for 15.0 years (7.5 ttl pk-yrs)    Types: Cigarettes   Smokeless tobacco: Never  Vaping Use   Vaping status: Never Used  Substance and Sexual Activity   Alcohol use: Yes     Alcohol/week: 0.0 standard drinks of alcohol    Comment: 2-3 times a week per patient   Drug use: No   Sexual activity: Yes    Birth control/protection: Implant  Other Topics Concern   Not on file  Social History Narrative   Lives in [place]. 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 2 grandchildren, 1 son.      Diet: regular diet, limits fried food   Exercise: walks goal daily   Social Drivers of Health   Financial Resource Strain: Low Risk  (08/13/2023)   Received from [facility]   Overall Financial Resource Strain (CARDIA)    Difficulty of Paying Living Expenses: Not hard at all  Food Insecurity: No Food Insecurity (08/13/2023)   Received from [facility]   Hunger Vital Sign    Worried About Running Out of Food in the Last Year: Never true    Ran Out of Food in the Last Year: Never true  Transportation Needs: No Transportation Needs (08/13/2023)   Received from [facility]    In the past 12 months, has lack of transportation kept you from medical appointments or from getting medications?: No    Lack of Transportation (Non-Medical): No  Physical  Activity: Not on file  Stress: Not on file  Social Connections: Not on file     Review of Systems  Constitutional:  Negative for appetite change and unexpected weight change.  HENT:  Negative for congestion and sinus pressure.   Respiratory:  Negative for cough, chest tightness and shortness of breath.   Cardiovascular:  Negative for chest pain, palpitations and leg swelling.  Gastrointestinal:  Negative for abdominal pain, diarrhea, nausea and vomiting.  Endocrine:       Hot flashes.   Genitourinary:  Negative for difficulty urinating and dysuria.  Musculoskeletal:        Legs improved off pravastatin.   Skin:  Negative for color change and rash.  Neurological:  Negative for dizziness and headaches.  Psychiatric/Behavioral:  Negative for  agitation and dysphoric mood.        Increased stress.        Objective:     BP 128/72   Pulse 75   Temp 98 F (36.7 C) (Oral)   Ht 5\' 11"  (1.803 m)   Wt 250 lb 9.6 oz (113.7 kg)   SpO2 98%   BMI 34.95 kg/m  Wt Readings from Last 3 Encounters:  12/17/23 250 lb 9.6 oz (113.7 kg)  09/03/23 248 lb (112.5 kg)  05/29/23 248 lb (112.5 kg)    Physical Exam Vitals reviewed.  Constitutional:      General: She is not in acute distress.    Appearance: Normal appearance.  HENT:     Head: Normocephalic and atraumatic.     Right Ear: External ear normal.     Left Ear: External ear normal.     Mouth/Throat:     Pharynx: No oropharyngeal exudate or posterior oropharyngeal erythema.  Eyes:     General: No scleral icterus.       Right eye: No discharge.        Left eye: No discharge.     Conjunctiva/sclera: Conjunctivae normal.  Neck:     Thyroid: No thyromegaly.  Cardiovascular:     Rate and Rhythm: Normal rate and regular rhythm.  Pulmonary:     Effort: No respiratory distress.     Breath sounds: Normal breath sounds. No wheezing.  Abdominal:     General: Bowel sounds are normal.     Palpations: Abdomen is soft.     Tenderness: There is no abdominal tenderness.  Musculoskeletal:        General: No swelling or tenderness.     Cervical back: Neck supple. No tenderness.  Lymphadenopathy:     Cervical: No cervical adenopathy.  Skin:    Findings: No erythema or rash.  Neurological:     Mental Status: She is alert.  Psychiatric:        Mood and Affect: Mood normal.        Behavior: Behavior normal.         Outpatient Encounter Medications as of 12/17/2023  Medication Sig   amLODipine (NORVASC) 2.5 MG tablet Take 1 tablet (2.5 mg total) by mouth daily.   B Complex Vitamins (B COMPLEX 100 PO) Take by mouth.   buPROPion (WELLBUTRIN XL) 150 MG 24 hr tablet Take 1 tablet (150 mg total) by mouth 2 (two) times daily.   hydrochlorothiazide (HYDRODIURIL) 25 MG tablet Take 1  tablet (25 mg total) by mouth daily.   MegaRed Omega-3 Krill Oil 500 MG CAPS Take 1 tablet by mouth daily.   Multiple Vitamin (MULTIVITAMIN WITH MINERALS) TABS tablet Take 1 tablet by  mouth daily.   Probiotic Product (PROBIOTIC DAILY PO) Take by mouth.   Ruxolitinib Phosphate (OPZELURA) 1.5 % CREA Apply 1 application  topically daily. Apply to face daily   [DISCONTINUED] pravastatin (PRAVACHOL) 40 MG tablet Take 1 tablet (40 mg total) by mouth daily.   No facility-administered encounter medications on file as of 12/17/2023.     Lab Results  Component Value Date   WBC 5.6 05/29/2023   HGB 13.5 05/29/2023   HCT 41.3 05/29/2023   PLT 270.0 05/29/2023   GLUCOSE 90 05/29/2023   CHOL 211 (H) 05/29/2023   TRIG 63.0 05/29/2023   HDL 74.30 05/29/2023   LDLCALC 124 (H) 05/29/2023   ALT 29 05/29/2023   AST 27 05/29/2023   NA 140 05/29/2023   K 4.9 05/29/2023   CL 103 05/29/2023   CREATININE 0.71 05/29/2023   BUN 12 05/29/2023   CO2 28 05/29/2023   TSH 0.59 05/29/2023   HGBA1C 6.1 05/29/2023    MM 3D SCREENING MAMMOGRAM BILATERAL BREAST Result Date: 08/28/2023 CLINICAL DATA:  Screening. EXAM: DIGITAL SCREENING BILATERAL MAMMOGRAM WITH TOMOSYNTHESIS AND CAD TECHNIQUE: Bilateral screening digital craniocaudal and mediolateral oblique mammograms were obtained. Bilateral screening digital breast tomosynthesis was performed. The images were evaluated with computer-aided detection. COMPARISON:  Previous exam(s). ACR Breast Density Category b: There are scattered areas of fibroglandular density. FINDINGS: There are no findings suspicious for malignancy. IMPRESSION: No mammographic evidence of malignancy. A result letter of this screening mammogram will be mailed directly to the patient. RECOMMENDATION: Screening mammogram in one year. (Code:SM-B-01Y) BI-RADS CATEGORY  1: Negative. Electronically Signed   By: Sherian Rein M.D.   On: 08/28/2023 13:07       Assessment & Plan:  Primary  hypertension Assessment & Plan: Continues on hydrochlorothiazide and amlodipine. No changes in medication. Check metabolic panel.   Orders: -     Basic metabolic panel; Future  Hyperglycemia Assessment & Plan: Low carb diet and exercise. Follow met b and A1c.   Orders: -     Hemoglobin A1c; Future  Hypercholesterolemia Assessment & Plan: Intolerant to statin medication. Discussed other treatment options. She has started exercising. Hold on starting new medication. Follow lipid panel.   Orders: -     Hepatic function panel; Future -     Lipid panel; Future  Atypical squamous cells of undetermined significance on cytologic smear of cervix (ASC-US) Assessment & Plan: PAP 04/2022 - negative.  F/u PAP 05/29/23 - negative with negative HPV.    Aortic atherosclerosis (HCC) Assessment & Plan: Intolerant to pravastatin. Feels better off. Discussed other treatment options. She is going to the gym. Exercise.  Follow lipid panel. Hold on further medication at this time.    Lesion of adrenal gland [facility]) Assessment & Plan: Evaluated by endocrinology.    Cyst of ovary, unspecified laterality Assessment & Plan: Saw Dr Jean Rosenthal 05/2022.  Per note review, had recommended f/u. Discussed today. She wants to hold on referral. Will notify me when agreeable.    Stress Assessment & Plan: Continues on wellbutrin. Overall appears to be stable.    Thyroid fullness Assessment & Plan: Saw endocrinology. S/p biopsy - per report - ok.    Tobacco use Assessment & Plan: Lung screening CT chest - 01/2023 - Lung-RADS 2, benign appearance or behavior. Continue annual screening with low-dose chest CT without contrast in 12 months. Continue f/u annual screening.    Hot flashes Assessment & Plan: Hot flashes as outlined. Discussed treatment options.  Dale , MD

## 2023-12-17 NOTE — Assessment & Plan Note (Signed)
 Hot flashes as outlined. Discussed treatment options.

## 2023-12-17 NOTE — Assessment & Plan Note (Addendum)
 Continues on hydrochlorothiazide and amlodipine. No changes in medication. Check metabolic panel.

## 2023-12-17 NOTE — Assessment & Plan Note (Signed)
 Intolerant to pravastatin. Feels better off. Discussed other treatment options. She is going to the gym. Exercise.  Follow lipid panel. Hold on further medication at this time.

## 2023-12-17 NOTE — Assessment & Plan Note (Signed)
 Lung screening CT chest - 01/2023 - Lung-RADS 2, benign appearance or behavior. Continue annual screening with low-dose chest CT without contrast in 12 months. Continue f/u annual screening.

## 2023-12-17 NOTE — Assessment & Plan Note (Signed)
 Continues on wellbutrin. Overall appears to be stable.

## 2023-12-17 NOTE — Assessment & Plan Note (Signed)
 Intolerant to statin medication. Discussed other treatment options. She has started exercising. Hold on starting new medication. Follow lipid panel.

## 2023-12-17 NOTE — Assessment & Plan Note (Signed)
 Saw Dr Jean Rosenthal 05/2022.  Per note review, had recommended f/u. Discussed today. She wants to hold on referral. Will notify me when agreeable.

## 2023-12-30 ENCOUNTER — Ambulatory Visit: Payer: Commercial Managed Care - PPO | Admitting: Dermatology

## 2024-01-20 ENCOUNTER — Encounter: Payer: Self-pay | Admitting: Dermatology

## 2024-01-20 ENCOUNTER — Ambulatory Visit: Payer: Commercial Managed Care - PPO | Admitting: Dermatology

## 2024-01-20 ENCOUNTER — Other Ambulatory Visit: Payer: Self-pay

## 2024-01-20 DIAGNOSIS — L810 Postinflammatory hyperpigmentation: Secondary | ICD-10-CM

## 2024-01-20 DIAGNOSIS — L309 Dermatitis, unspecified: Secondary | ICD-10-CM | POA: Diagnosis not present

## 2024-01-20 DIAGNOSIS — L308 Other specified dermatitis: Secondary | ICD-10-CM

## 2024-01-20 MED ORDER — OPZELURA 1.5 % EX CREA
1.0000 "application " | TOPICAL_CREAM | Freq: Every day | CUTANEOUS | 2 refills | Status: AC
  Filled 2024-01-20: qty 60, 30d supply, fill #0
  Filled 2024-11-11: qty 60, 30d supply, fill #1
  Filled 2024-11-16: qty 60, 60d supply, fill #1

## 2024-01-20 NOTE — Patient Instructions (Signed)

## 2024-01-20 NOTE — Progress Notes (Signed)
   Follow-Up Visit   Subjective  Paula Wallace is a 58 y.o. female who presents for the following: eczema at face, flares with stress. Patient uses Opzelura as needed. Patient advises she has only had 2-3 flares in the last year.  The patient has spots, moles and lesions to be evaluated, some may be new or changing and the patient may have concern these could be cancer.   The following portions of the chart were reviewed this encounter and updated as appropriate: medications, allergies, medical history  Review of Systems:  No other skin or systemic complaints except as noted in HPI or Assessment and Plan.  Objective  Well appearing patient in no apparent distress; mood and affect are within normal limits.   A focused examination was performed of the following areas: face  Relevant exam findings are noted in the Assessment and Plan.    Assessment & Plan   ECZEMA, chronic well controlled at goal Exam: PIH on perioral and right forehead  Atopic dermatitis (eczema) is a chronic, relapsing, pruritic condition that can significantly affect quality of life. It is often associated with allergic rhinitis and/or asthma and can require treatment with topical medications, phototherapy, or in severe cases biologic injectable medication (Dupixent; Adbry) or Oral JAK inhibitors.  Treatment Plan: Continue Opzelura daily to face as needed for flares.    OTHER ECZEMA   Related Medications Ruxolitinib Phosphate (OPZELURA) 1.5 % CREA Apply 1 application  topically daily. Apply to face daily  Return in about 1 year (around 01/19/2025) for Eczema, with Dr. Katrinka Blazing.  Anise Salvo, RMA, am acting as scribe for Elie Goody, MD .   Documentation: I have reviewed the above documentation for accuracy and completeness, and I agree with the above.  Elie Goody, MD

## 2024-01-24 MED FILL — Hydrochlorothiazide Tab 25 MG: ORAL | 90 days supply | Qty: 90 | Fill #0 | Status: AC

## 2024-01-25 ENCOUNTER — Other Ambulatory Visit: Payer: Self-pay

## 2024-02-16 ENCOUNTER — Other Ambulatory Visit: Payer: Self-pay | Admitting: Acute Care

## 2024-02-16 DIAGNOSIS — Z122 Encounter for screening for malignant neoplasm of respiratory organs: Secondary | ICD-10-CM

## 2024-02-16 DIAGNOSIS — F17210 Nicotine dependence, cigarettes, uncomplicated: Secondary | ICD-10-CM

## 2024-02-16 DIAGNOSIS — Z87891 Personal history of nicotine dependence: Secondary | ICD-10-CM

## 2024-02-18 ENCOUNTER — Ambulatory Visit
Admission: RE | Admit: 2024-02-18 | Discharge: 2024-02-18 | Disposition: A | Discharge status: Home / Self Care | Source: Ambulatory Visit | Attending: Acute Care | Admitting: Acute Care

## 2024-02-18 DIAGNOSIS — Z122 Encounter for screening for malignant neoplasm of respiratory organs: Secondary | ICD-10-CM | POA: Diagnosis not present

## 2024-02-18 DIAGNOSIS — Z87891 Personal history of nicotine dependence: Secondary | ICD-10-CM | POA: Insufficient documentation

## 2024-02-18 DIAGNOSIS — F17210 Nicotine dependence, cigarettes, uncomplicated: Secondary | ICD-10-CM | POA: Diagnosis not present

## 2024-02-24 ENCOUNTER — Other Ambulatory Visit: Payer: Self-pay

## 2024-02-24 MED FILL — Bupropion HCl Tab ER 24HR 150 MG: ORAL | 90 days supply | Qty: 180 | Fill #1 | Status: AC

## 2024-03-08 MED FILL — Amlodipine Besylate Tab 2.5 MG (Base Equivalent): ORAL | 90 days supply | Qty: 90 | Fill #1 | Status: AC

## 2024-03-09 ENCOUNTER — Other Ambulatory Visit: Payer: Self-pay

## 2024-03-16 ENCOUNTER — Other Ambulatory Visit: Payer: Self-pay | Admitting: Acute Care

## 2024-03-16 ENCOUNTER — Telehealth: Payer: Self-pay | Admitting: Internal Medicine

## 2024-03-16 DIAGNOSIS — F17210 Nicotine dependence, cigarettes, uncomplicated: Secondary | ICD-10-CM

## 2024-03-16 DIAGNOSIS — Z122 Encounter for screening for malignant neoplasm of respiratory organs: Secondary | ICD-10-CM

## 2024-03-16 DIAGNOSIS — Z87891 Personal history of nicotine dependence: Secondary | ICD-10-CM

## 2024-03-16 NOTE — Telephone Encounter (Signed)
 Left a message and sent a MyChart message that appointment on 03/18/2024 needs to be rescheduled. Dr Geralyn Knee has a meeting to attend.  E2C2 schedule next available.

## 2024-03-18 ENCOUNTER — Ambulatory Visit: Payer: Commercial Managed Care - PPO | Admitting: Internal Medicine

## 2024-04-22 ENCOUNTER — Other Ambulatory Visit: Payer: Self-pay

## 2024-04-22 ENCOUNTER — Ambulatory Visit: Admitting: Internal Medicine

## 2024-04-22 VITALS — BP 130/76 | HR 73 | Resp 16 | Ht 71.0 in | Wt 232.4 lb

## 2024-04-22 DIAGNOSIS — Z720 Tobacco use: Secondary | ICD-10-CM | POA: Diagnosis not present

## 2024-04-22 DIAGNOSIS — I700 Atherosclerosis of aorta: Secondary | ICD-10-CM | POA: Diagnosis not present

## 2024-04-22 DIAGNOSIS — R739 Hyperglycemia, unspecified: Secondary | ICD-10-CM

## 2024-04-22 DIAGNOSIS — E279 Disorder of adrenal gland, unspecified: Secondary | ICD-10-CM

## 2024-04-22 DIAGNOSIS — E0789 Other specified disorders of thyroid: Secondary | ICD-10-CM

## 2024-04-22 DIAGNOSIS — N83209 Unspecified ovarian cyst, unspecified side: Secondary | ICD-10-CM

## 2024-04-22 DIAGNOSIS — F439 Reaction to severe stress, unspecified: Secondary | ICD-10-CM

## 2024-04-22 DIAGNOSIS — I10 Essential (primary) hypertension: Secondary | ICD-10-CM | POA: Diagnosis not present

## 2024-04-22 DIAGNOSIS — E7800 Pure hypercholesterolemia, unspecified: Secondary | ICD-10-CM | POA: Diagnosis not present

## 2024-04-22 MED ORDER — BUPROPION HCL ER (XL) 150 MG PO TB24
150.0000 mg | ORAL_TABLET | Freq: Two times a day (BID) | ORAL | 1 refills | Status: DC
  Filled 2024-04-22 – 2024-05-25 (×2): qty 180, 90d supply, fill #0

## 2024-04-22 MED ORDER — AMLODIPINE BESYLATE 2.5 MG PO TABS
2.5000 mg | ORAL_TABLET | Freq: Every day | ORAL | 1 refills | Status: DC
  Filled 2024-04-22 – 2024-06-15 (×2): qty 90, 90d supply, fill #0

## 2024-04-22 NOTE — Progress Notes (Signed)
 Subjective:    [name], female    DOB: [birth year], 58 y.o.   MRN: [id number]  Patient here for  Chief Complaint  Patient presents with   Medical Management of Chronic Issues    HPI Here for a scheduled follow up - follow up regarding hypercholesterolemia, hyperglycemia and hypertension. Recently evaluated by Dr Cherilyn for thyroid  nodule. Biopsy 10/29/23 - per her report - ok. Discussed f/u regarding ovarian cyst and pelvic ultrasound. She wants to hold on further testing at this time. Overall she feels she is doing well. No chest pain. Breathing stable. No abdominal pain or bowel change. Had f/u lung screening 01/2024 - ok.  Recommended f/u one year. Continues to smoke. Discussed.    Past Medical History:  Diagnosis Date   Hyperlipidemia    Hypertension    Past Surgical History:  Procedure Laterality Date   VAGINAL DELIVERY  1989   episiotomy, Dr. Jonni   Family History  Problem Relation Age of Onset   Diabetes Mother    Dementia Mother    Stroke Father 63   Hypertension Sister    Thyroid  disease Sister    Diabetes Sister    Hypertension Brother    Diabetes Brother    Hypertension Sister    Hypertension Brother    Hypertension Brother    Hypertension Brother    Hypertension Sister    Breast cancer Neg Hx    Lupus Neg Hx    Social History   Socioeconomic History   Marital status: Single    Spouse name: Not on file   Number of children: Not on file   Years of education: Not on file   Highest education level: Not on file  Occupational History   Not on file  Tobacco Use   Smoking status: Some Days    Current packs/day: 0.50    Average packs/day: 0.5 packs/day for 15.0 years (7.5 ttl pk-yrs)    Types: Cigarettes   Smokeless tobacco: Never  Vaping Use   Vaping status: Never Used  Substance and Sexual Activity   Alcohol use: Yes    Alcohol/week: 0.0 standard drinks of alcohol    Comment: 2-3 times a week per patient   Drug use: No    Sexual activity: Yes    Birth control/protection: Implant  Other Topics Concern   Not on file  Social History Narrative   Lives in [place]. 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 2 grandchildren, 1 son.      Diet: regular diet, limits fried food   Exercise: walks goal daily   Social Drivers of Health   Financial Resource Strain: Low Risk  (08/13/2023)   Received from [facility]   Overall Financial Resource Strain (CARDIA)    Difficulty of Paying Living Expenses: Not hard at all  Food Insecurity: No Food Insecurity (08/13/2023)   Received from [facility]   Hunger Vital Sign    Within the past 12 months, you worried that your food would run out before you got the money to buy more.: Never true    Within the past 12 months, the food you bought just didn't last and you didn't have money to get more.: Never true  Transportation Needs: No Transportation Needs (08/13/2023)   Received from [facility] - Transportation    In the past 12 months, has lack of transportation kept you from medical appointments or from getting medications?: No  Lack of Transportation (Non-Medical): No  Physical Activity: Not on file  Stress: Not on file  Social Connections: Not on file     Review of Systems  Constitutional:  Negative for appetite change and unexpected weight change.  HENT:  Negative for congestion and sinus pressure.   Respiratory:  Negative for cough, chest tightness and shortness of breath.   Cardiovascular:  Negative for chest pain and palpitations.       No increased swelling.   Gastrointestinal:  Negative for abdominal pain, diarrhea, nausea and vomiting.  Genitourinary:  Negative for difficulty urinating and dysuria.  Musculoskeletal:  Negative for joint swelling and myalgias.  Skin:  Negative for color change and rash.  Neurological:  Negative for dizziness and headaches.  Psychiatric/Behavioral:   Negative for agitation and dysphoric mood.        Objective:     BP 130/76   Pulse 73   Resp 16   Ht 5' 11 (1.803 m)   Wt 232 lb 6.4 oz (105.4 kg)   SpO2 99%   BMI 32.41 kg/m  Wt Readings from Last 3 Encounters:  04/22/24 232 lb 6.4 oz (105.4 kg)  12/17/23 250 lb 9.6 oz (113.7 kg)  09/03/23 248 lb (112.5 kg)    Physical Exam Vitals reviewed.  Constitutional:      General: She is not in acute distress.    Appearance: Normal appearance.  HENT:     Head: Normocephalic and atraumatic.     Right Ear: External ear normal.     Left Ear: External ear normal.     Mouth/Throat:     Pharynx: No oropharyngeal exudate or posterior oropharyngeal erythema.  Eyes:     General: No scleral icterus.       Right eye: No discharge.        Left eye: No discharge.     Conjunctiva/sclera: Conjunctivae normal.  Neck:     Thyroid : No thyromegaly.  Cardiovascular:     Rate and Rhythm: Normal rate and regular rhythm.  Pulmonary:     Effort: No respiratory distress.     Breath sounds: Normal breath sounds. No wheezing.  Abdominal:     General: Bowel sounds are normal.     Palpations: Abdomen is soft.     Tenderness: There is no abdominal tenderness.  Musculoskeletal:        General: No swelling or tenderness.     Cervical back: Neck supple. No tenderness.  Lymphadenopathy:     Cervical: No cervical adenopathy.  Skin:    Findings: No erythema or rash.  Neurological:     Mental Status: She is alert.  Psychiatric:        Mood and Affect: Mood normal.        Behavior: Behavior normal.         Outpatient Encounter Medications as of 04/22/2024  Medication Sig   amLODipine  (NORVASC ) 2.5 MG tablet Take 1 tablet (2.5 mg total) by mouth daily.   B Complex Vitamins (B COMPLEX 100 PO) Take by mouth.   buPROPion  (WELLBUTRIN  XL) 150 MG 24 hr tablet Take 1 tablet (150 mg total) by mouth 2 (two) times daily.   hydrochlorothiazide  (HYDRODIURIL ) 25 MG tablet Take 1 tablet (25 mg total) by  mouth daily.   MegaRed Omega-3 Krill Oil 500 MG CAPS Take 1 tablet by mouth daily.   Multiple Vitamin (MULTIVITAMIN WITH MINERALS) TABS tablet Take 1 tablet by mouth daily.   Probiotic Product (PROBIOTIC DAILY PO) Take by mouth.  Ruxolitinib  Phosphate (OPZELURA ) 1.5 % CREA Apply 1 application  topically daily. Apply to face daily   [DISCONTINUED] amLODipine  (NORVASC ) 2.5 MG tablet Take 1 tablet (2.5 mg total) by mouth daily.   [DISCONTINUED] buPROPion  (WELLBUTRIN  XL) 150 MG 24 hr tablet Take 1 tablet (150 mg total) by mouth 2 (two) times daily.   No facility-administered encounter medications on file as of 04/22/2024.     Lab Results  Component Value Date   WBC 5.8 04/22/2024   HGB 14.5 04/22/2024   HCT 43.7 04/22/2024   PLT 236 04/22/2024   GLUCOSE 95 04/22/2024   CHOL 237 (H) 04/22/2024   TRIG 116 04/22/2024   HDL 74 04/22/2024   LDLCALC 143 (H) 04/22/2024   ALT 22 04/22/2024   AST 27 04/22/2024   NA 140 04/22/2024   K 4.2 04/22/2024   CL 100 04/22/2024   CREATININE 0.77 04/22/2024   BUN 12 04/22/2024   CO2 20 04/22/2024   TSH 0.693 04/22/2024   HGBA1C 6.0 (H) 04/22/2024    CT CHEST LUNG CA SCREEN LOW DOSE W/O CM Result Date: 03/15/2024 CLINICAL DATA:  Current 30 pack-year smoker. EXAM: CT CHEST WITHOUT CONTRAST LOW-DOSE FOR LUNG CANCER SCREENING TECHNIQUE: Multidetector CT imaging of the chest was performed following the standard protocol without IV contrast. RADIATION DOSE REDUCTION: This exam was performed according to the departmental dose-optimization program which includes automated exposure control, adjustment of the mA and/or kV according to patient size and/or use of iterative reconstruction technique. COMPARISON:  02/11/2023. FINDINGS: Cardiovascular: Heart is at the upper limits of normal in size. No pericardial effusion. Mediastinum/Nodes: Partially imaged low-attenuation lesions in the thyroid  measure up to 2.0 cm on the left. No pathologically enlarged mediastinal or  axillary lymph nodes. Hilar regions are difficult to definitively evaluate without IV contrast. Esophagus is grossly unremarkable. Lungs/Pleura: Centrilobular and paraseptal emphysema. Smoking related respiratory bronchiolitis. 4.0 mm right lower lobe nodule, unchanged. No new pulmonary nodules. Minimal scarring in the left lower lobe. No pleural fluid. Airway is unremarkable. Upper Abdomen: Visualized portions of the liver, adrenal glands, kidneys, spleen, pancreas, stomach and bowel are grossly unremarkable. No upper abdominal adenopathy. Musculoskeletal: Degenerative changes in the spine.  Osteopenia. IMPRESSION: 1. Lung-RADS 2, benign appearance or behavior. Continue annual screening with low-dose chest CT without contrast in 12 months. 2. Low-attenuation thyroid  nodules. Recommend thyroid  ultrasound. (Ref: J Am Coll Radiol. 2015 Feb;12(2): 143-50). 3.  Emphysema (ICD10-J43.9). Electronically Signed   By: Newell Eke M.D.   On: 03/15/2024 12:53       Assessment & Plan:  Hyperglycemia -     Hemoglobin A1c  Primary hypertension Assessment & Plan: Continues on hydrochlorothiazide  and amlodipine . No changes in medication. Check metabolic panel.   Orders: -     Basic metabolic panel with GFR  Hypercholesterolemia Assessment & Plan: Intolerant to statin medication. Have discussed other treatment options. Continue low choelsterol diet and exercise.check lipid panel.   Orders: -     TSH -     CBC with Differential/Platelet -     Hepatic function panel -     Lipid panel  Aortic atherosclerosis (HCC) Assessment & Plan: Intolerant to pravastatin . Felt better off. Have discussed other treatment options. Continue low cholesterol diet and exercise. Check lipid panel.    Lesion of adrenal gland [facility]) Assessment & Plan: Evaluated by endocrinology.    Tobacco use Assessment & Plan: Just had lung screening 01/2024. Recommended continuing annual screening. Discussed smoking. Continues to  smoke follow.  Thyroid  fullness Assessment & Plan: Recently evaluated by Dr Cherilyn for thyroid  nodule. Biopsy 10/29/23 - per her report - ok.    Stress Assessment & Plan: Continues on wellbutrin . Overall appears to be doing well. Has good family support. Follow.    Cyst of ovary, unspecified laterality Assessment & Plan: Saw Dr Leonce 05/2022.  Per note review, had recommended f/u. Discussed today. Wants to hold on follow. Will notify me when agreeable.    Other orders -     amLODIPine  Besylate; Take 1 tablet (2.5 mg total) by mouth daily.  Dispense: 90 tablet; Refill: 1 -     buPROPion  HCl ER (XL); Take 1 tablet (150 mg total) by mouth 2 (two) times daily.  Dispense: 180 tablet; Refill: 1     Allena Hamilton, MD

## 2024-04-23 LAB — CBC WITH DIFFERENTIAL/PLATELET
Basophils Absolute: 0 x10E3/uL (ref 0.0–0.2)
Basos: 0 %
EOS (ABSOLUTE): 0.1 x10E3/uL (ref 0.0–0.4)
Eos: 2 %
Hematocrit: 43.7 % (ref 34.0–46.6)
Hemoglobin: 14.5 g/dL (ref 11.1–15.9)
Immature Grans (Abs): 0 x10E3/uL (ref 0.0–0.1)
Immature Granulocytes: 0 %
Lymphocytes Absolute: 2.4 x10E3/uL (ref 0.7–3.1)
Lymphs: 42 %
MCH: 31.1 pg (ref 26.6–33.0)
MCHC: 33.2 g/dL (ref 31.5–35.7)
MCV: 94 fL (ref 79–97)
Monocytes Absolute: 0.5 x10E3/uL (ref 0.1–0.9)
Monocytes: 8 %
Neutrophils Absolute: 2.7 x10E3/uL (ref 1.4–7.0)
Neutrophils: 48 %
Platelets: 236 x10E3/uL (ref 150–450)
RBC: 4.66 x10E6/uL (ref 3.77–5.28)
RDW: 13.1 % (ref 11.7–15.4)
WBC: 5.8 x10E3/uL (ref 3.4–10.8)

## 2024-04-23 LAB — BASIC METABOLIC PANEL WITH GFR
BUN/Creatinine Ratio: 16 (ref 9–23)
BUN: 12 mg/dL (ref 6–24)
CO2: 20 mmol/L (ref 20–29)
Calcium: 10 mg/dL (ref 8.7–10.2)
Chloride: 100 mmol/L (ref 96–106)
Creatinine, Ser: 0.77 mg/dL (ref 0.57–1.00)
Glucose: 95 mg/dL (ref 70–99)
Potassium: 4.2 mmol/L (ref 3.5–5.2)
Sodium: 140 mmol/L (ref 134–144)
eGFR: 90 mL/min/1.73 (ref >59)

## 2024-04-23 LAB — HEPATIC FUNCTION PANEL
ALT: 22 IU/L (ref 0–32)
AST: 27 IU/L (ref 0–40)
Albumin: 4.6 g/dL (ref 3.8–4.9)
Alkaline Phosphatase: 92 IU/L (ref 44–121)
Bilirubin Total: 0.6 mg/dL (ref 0.0–1.2)
Bilirubin, Direct: 0.19 mg/dL (ref 0.00–0.40)
Total Protein: 7 g/dL (ref 6.0–8.5)

## 2024-04-23 LAB — LIPID PANEL
Chol/HDL Ratio: 3.2 ratio (ref 0.0–4.4)
Cholesterol, Total: 237 mg/dL — ABNORMAL HIGH (ref 100–199)
HDL: 74 mg/dL (ref >39)
LDL Chol Calc (NIH): 143 mg/dL — ABNORMAL HIGH (ref 0–99)
Triglycerides: 116 mg/dL (ref 0–149)
VLDL Cholesterol Cal: 20 mg/dL (ref 5–40)

## 2024-04-23 LAB — TSH: TSH: 0.693 u[IU]/mL (ref 0.450–4.500)

## 2024-04-23 LAB — HEMOGLOBIN A1C
Est. average glucose Bld gHb Est-mCnc: 126 mg/dL
Hgb A1c MFr Bld: 6 % — ABNORMAL HIGH (ref 4.8–5.6)

## 2024-04-24 ENCOUNTER — Ambulatory Visit: Payer: Self-pay | Admitting: Internal Medicine

## 2024-04-24 ENCOUNTER — Encounter: Payer: Self-pay | Admitting: Internal Medicine

## 2024-04-24 NOTE — Assessment & Plan Note (Signed)
Evaluated by endocrinology

## 2024-04-24 NOTE — Assessment & Plan Note (Signed)
 Just had lung screening 01/2024. Recommended continuing annual screening. Discussed smoking. Continues to smoke follow.

## 2024-04-24 NOTE — Assessment & Plan Note (Signed)
 Saw Dr Leonce 05/2022.  Per note review, had recommended f/u. Discussed today. Wants to hold on follow. Will notify me when agreeable.

## 2024-04-24 NOTE — Assessment & Plan Note (Signed)
 Recently evaluated by Dr Cherilyn for thyroid  nodule. Biopsy 10/29/23 - per her report - ok.

## 2024-04-24 NOTE — Assessment & Plan Note (Signed)
 Intolerant to statin medication. Have discussed other treatment options. Continue low choelsterol diet and exercise.check lipid panel.

## 2024-04-24 NOTE — Assessment & Plan Note (Signed)
 Continues on wellbutrin . Overall appears to be doing well. Has good family support. Follow.

## 2024-04-24 NOTE — Assessment & Plan Note (Signed)
 Continues on hydrochlorothiazide and amlodipine. No changes in medication. Check metabolic panel.

## 2024-04-24 NOTE — Assessment & Plan Note (Signed)
 Intolerant to pravastatin . Felt better off. Have discussed other treatment options. Continue low cholesterol diet and exercise. Check lipid panel.

## 2024-04-26 MED ORDER — EZETIMIBE 10 MG PO TABS
10.0000 mg | ORAL_TABLET | Freq: Every day | ORAL | 3 refills | Status: DC
  Filled 2024-04-26: qty 30, 30d supply, fill #0
  Filled 2024-05-25: qty 30, 30d supply, fill #1
  Filled 2024-07-06: qty 30, 30d supply, fill #2
  Filled 2024-08-06: qty 30, 30d supply, fill #3

## 2024-04-27 ENCOUNTER — Other Ambulatory Visit: Payer: Self-pay

## 2024-05-06 ENCOUNTER — Other Ambulatory Visit: Payer: Self-pay

## 2024-05-06 MED FILL — Hydrochlorothiazide Tab 25 MG: ORAL | 90 days supply | Qty: 90 | Fill #1 | Status: AC

## 2024-05-24 ENCOUNTER — Encounter: Payer: Self-pay | Admitting: Internal Medicine

## 2024-05-25 ENCOUNTER — Other Ambulatory Visit: Payer: Self-pay

## 2024-05-26 ENCOUNTER — Encounter: Payer: Self-pay | Admitting: Family

## 2024-05-26 ENCOUNTER — Ambulatory Visit: Admitting: Family

## 2024-05-26 VITALS — BP 136/78 | HR 98 | Temp 98.7°F | Ht 71.0 in | Wt 234.2 lb

## 2024-05-26 DIAGNOSIS — R300 Dysuria: Secondary | ICD-10-CM

## 2024-05-26 LAB — POCT URINALYSIS DIPSTICK
Bilirubin, UA: NEGATIVE
Blood, UA: NEGATIVE
Glucose, UA: NEGATIVE
Ketones, UA: NEGATIVE
Nitrite, UA: NEGATIVE
Protein, UA: NEGATIVE
Spec Grav, UA: 1.02 (ref 1.010–1.025)
Urobilinogen, UA: 1 U/dL
pH, UA: 7 (ref 5.0–8.0)

## 2024-05-26 LAB — URINALYSIS, ROUTINE W REFLEX MICROSCOPIC
Bilirubin Urine: NEGATIVE
Hgb urine dipstick: NEGATIVE
Ketones, ur: NEGATIVE
Nitrite: NEGATIVE
Specific Gravity, Urine: 1.01 (ref 1.000–1.030)
Total Protein, Urine: NEGATIVE
Urine Glucose: NEGATIVE
Urobilinogen, UA: 0.2 (ref 0.0–1.0)
pH: 7 (ref 5.0–8.0)

## 2024-05-26 NOTE — Progress Notes (Signed)
 Assessment & Plan:  Dysuria Assessment & Plan: POC urine with negative blood, nitrites. Trace leukocytes. She politely declines antibiotic ahead of UA, urine culture. She will let me know if symptoms worsen Discussed stress UI and OAB symptoms. She will consider pelvic floor PT, potential pharmacotherapy and let me know.   Orders: -     Urine Culture -     Urinalysis, Routine w reflex microscopic -     POCT urinalysis dipstick     Return precautions given.   Risks, benefits, and alternatives of the medications and treatment plan prescribed today were discussed, and patient expressed understanding.   Education regarding symptom management and diagnosis given to patient on AVS either electronically or printed.  No follow-ups on file.  Rollene Northern, FNP  Subjective:    [name], female    DOB: 12/22/1965, 58 y.o.   MRN: [id number]  CC: Paula Wallace is a 58 y.o. female who presents today for an acute visit.    HPI: Complains of urinary pressure, dysuria, and urinary frequency x 6 days, waxes and wanes  Denies constipation, chills, fever, prolapse, abdominal pain, hematuria, flank pain.   She endorses urinary incontinence with laughing and urinary urgency. She is wearing a Poise pad.       H/o HTN, aortic atherosclerosis, prediabetes ( 6.0)  No ckd  Allergies: Keflex  [cephalexin ] and Mupirocin  Current Outpatient Medications on File Prior to Visit  Medication Sig Dispense Refill   amLODipine  (NORVASC ) 2.5 MG tablet Take 1 tablet (2.5 mg total) by mouth daily. 90 tablet 1   B Complex Vitamins (B COMPLEX 100 PO) Take by mouth.     buPROPion  (WELLBUTRIN  XL) 150 MG 24 hr tablet Take 1 tablet (150 mg total) by mouth 2 (two) times daily. 180 tablet 1   ezetimibe  (ZETIA ) 10 MG tablet Take 1 tablet (10 mg total) by mouth daily. 30 tablet 3   hydrochlorothiazide  (HYDRODIURIL ) 25 MG tablet Take 1 tablet (25 mg total) by mouth daily. 90 tablet 2    MegaRed Omega-3 Krill Oil 500 MG CAPS Take 1 tablet by mouth daily.     Multiple Vitamin (MULTIVITAMIN WITH MINERALS) TABS tablet Take 1 tablet by mouth daily.     Probiotic Product (PROBIOTIC DAILY PO) Take by mouth.     Ruxolitinib  Phosphate (OPZELURA ) 1.5 % CREA Apply 1 application  topically daily. Apply to face daily 60 g 2   No current facility-administered medications on file prior to visit.    Review of Systems  Constitutional:  Negative for chills and fever.  Respiratory:  Negative for cough.   Cardiovascular:  Negative for chest pain and palpitations.  Gastrointestinal:  Negative for nausea and vomiting.  Genitourinary:  Positive for dysuria, frequency and urgency. Negative for difficulty urinating, flank pain and pelvic pain.      Objective:    BP 136/78 (BP Location: Left Arm, Patient Position: Sitting, Cuff Size: Normal)   Pulse 98   Temp 98.7 F (37.1 C) (Oral)   Ht 5' 11 (1.803 m)   Wt 234 lb 3.2 oz (106.2 kg)   SpO2 98%   BMI 32.66 kg/m   BP Readings from Last 3 Encounters:  05/26/24 136/78  04/22/24 130/76  12/17/23 128/72   Wt Readings from Last 3 Encounters:  05/26/24 234 lb 3.2 oz (106.2 kg)  04/22/24 232 lb 6.4 oz (105.4 kg)  12/17/23 250 lb 9.6 oz (113.7 kg)    Physical Exam Vitals reviewed.  Constitutional:  Appearance: Normal appearance. She is well-developed.  Eyes:     Conjunctiva/sclera: Conjunctivae normal.  Cardiovascular:     Rate and Rhythm: Normal rate and regular rhythm.     Pulses: Normal pulses.     Heart sounds: Normal heart sounds.  Pulmonary:     Effort: Pulmonary effort is normal.     Breath sounds: Normal breath sounds. No wheezing, rhonchi or rales.  Abdominal:     General: Bowel sounds are normal. There is no distension.     Palpations: Abdomen is soft. Abdomen is not rigid. There is no fluid wave or mass.     Tenderness: There is no abdominal tenderness. There is no right CVA tenderness, left CVA tenderness,  guarding or rebound.  Skin:    General: Skin is warm and dry.  Neurological:     Mental Status: She is alert.  Psychiatric:        Speech: Speech normal.        Behavior: Behavior normal.        Thought Content: Thought content normal.

## 2024-05-26 NOTE — Assessment & Plan Note (Signed)
 POC urine with negative blood, nitrites. Trace leukocytes. She politely declines antibiotic ahead of UA, urine culture. She will let me know if symptoms worsen Discussed stress UI and OAB symptoms. She will consider pelvic floor PT, potential pharmacotherapy and let me know.

## 2024-05-26 NOTE — Patient Instructions (Signed)
 Let me know if symptoms worsen  Plenty of water.

## 2024-05-27 ENCOUNTER — Telehealth: Payer: Self-pay

## 2024-05-27 ENCOUNTER — Encounter: Payer: Self-pay | Admitting: Family

## 2024-05-27 LAB — URINE CULTURE
MICRO NUMBER:: 16795010
SPECIMEN QUALITY:: ADEQUATE

## 2024-05-27 NOTE — Telephone Encounter (Signed)
 Copied from CRM [id number]. Topic: Clinical - Lab/Test Results >> May 27, 2024  1:07 PM Paula Wallace wrote: Reason for CRM: Patient is calling in due to seeing her results on mychart, patient is asking if the provider is going to send over medications for the results, of the testing she had done yesterday.

## 2024-05-28 ENCOUNTER — Ambulatory Visit: Payer: Self-pay | Admitting: Family

## 2024-05-28 ENCOUNTER — Telehealth: Payer: Self-pay | Admitting: Family

## 2024-05-28 DIAGNOSIS — R300 Dysuria: Secondary | ICD-10-CM

## 2024-05-28 NOTE — Telephone Encounter (Signed)
 LVM to call back to office to discuss message below my chart message sent as well   I explained in the office the culture may take 3 days, pending She declined abx during visit   Does she feel she needs to start abx now?

## 2024-05-28 NOTE — Telephone Encounter (Signed)
 Left message on vm Sent mychart

## 2024-05-30 NOTE — Telephone Encounter (Signed)
 Pt responded to mychart message 05/28/24

## 2024-05-31 ENCOUNTER — Other Ambulatory Visit: Payer: Self-pay

## 2024-06-15 ENCOUNTER — Other Ambulatory Visit: Payer: Self-pay

## 2024-07-06 ENCOUNTER — Other Ambulatory Visit: Payer: Self-pay

## 2024-07-12 ENCOUNTER — Other Ambulatory Visit: Payer: Self-pay

## 2024-07-12 ENCOUNTER — Encounter (HOSPITAL_COMMUNITY): Payer: Self-pay

## 2024-07-12 ENCOUNTER — Emergency Department (HOSPITAL_COMMUNITY)
Admission: EM | Admit: 2024-07-12 | Discharge: 2024-07-12 | Disposition: A | Discharge status: Home / Self Care | Attending: Emergency Medicine | Admitting: Emergency Medicine

## 2024-07-12 DIAGNOSIS — K0889 Other specified disorders of teeth and supporting structures: Secondary | ICD-10-CM | POA: Diagnosis not present

## 2024-07-12 DIAGNOSIS — K029 Dental caries, unspecified: Secondary | ICD-10-CM | POA: Insufficient documentation

## 2024-07-12 DIAGNOSIS — Z79899 Other long term (current) drug therapy: Secondary | ICD-10-CM | POA: Diagnosis not present

## 2024-07-12 DIAGNOSIS — I10 Essential (primary) hypertension: Secondary | ICD-10-CM | POA: Insufficient documentation

## 2024-07-12 MED ORDER — AMOXICILLIN-POT CLAVULANATE 875-125 MG PO TABS
1.0000 | ORAL_TABLET | Freq: Two times a day (BID) | ORAL | 0 refills | Status: DC
  Filled 2024-07-12: qty 14, 7d supply, fill #0

## 2024-07-12 MED ORDER — AMOXICILLIN-POT CLAVULANATE 875-125 MG PO TABS
1.0000 | ORAL_TABLET | Freq: Once | ORAL | Status: AC
Start: 2024-07-12 — End: 2024-07-12
  Administered 2024-07-12: 1 via ORAL
  Filled 2024-07-12: qty 1

## 2024-07-12 MED ORDER — NAPROXEN 500 MG PO TABS
500.0000 mg | ORAL_TABLET | Freq: Two times a day (BID) | ORAL | 0 refills | Status: DC
  Filled 2024-07-12: qty 30, 15d supply, fill #0

## 2024-07-12 MED ORDER — LIDOCAINE VISCOUS HCL 2 % MT SOLN
5.0000 mL | Freq: Three times a day (TID) | OROMUCOSAL | 0 refills | Status: DC
  Filled 2024-07-12: qty 210, 14d supply, fill #0

## 2024-07-12 MED ORDER — NAPROXEN 500 MG PO TABS
500.0000 mg | ORAL_TABLET | Freq: Once | ORAL | Status: AC
  Administered 2024-07-12: 500 mg via ORAL
  Filled 2024-07-12: qty 1

## 2024-07-12 NOTE — Discharge Instructions (Addendum)
 It was a pleasure taking part in your care.  As discussed, please follow-up with dentistry in 1 week.  Please begin taking Augmentin  twice a day for 7 days.  Please take naproxen  twice a day for pain.  Utilize Magic mouthwash by swishing and spitting 3 times a day.  Return to ED with new symptoms.  If your dentist is unable to see you, I have attached a dental resource guide with other dentistry practices in the area.

## 2024-07-12 NOTE — ED Triage Notes (Signed)
 Pt presents via POV c/o left sided dental pain. Reports started yesterday at work.

## 2024-07-12 NOTE — ED Provider Notes (Signed)
 [place] EMERGENCY DEPARTMENT AT [facility] Provider Note   CSN: [id number] Arrival date & time: 07/12/24  0038     Patient presents with: Dental Pain   Paula Wallace is a 58 y.o. female with medical history of hypertension, hyperlipidemia.  Patient presents to the ED for evaluation of left lower dental pain.  States that for the last 1 days she has had pain to her left lower portion of teeth around her molar.  She states that she is set to see her dentist at the end of this month for 3-month cleaning.  She denies fevers, nausea or vomiting.  She denies trouble swallowing.  She reports she last saw her dentist 6 months ago.   Dental Pain      Prior to Admission medications   Medication Sig Start Date End Date Taking? Authorizing Provider  amoxicillin -clavulanate (AUGMENTIN ) 875-125 MG tablet Take 1 tablet by mouth every 12 (twelve) hours. 07/12/24  Yes Ruthell Lonni FALCON, PA-C  magic mouthwash (lidocaine , diphenhydrAMINE, alum & mag hydroxide) suspension Swish and spit 5 mLs 3 (three) times daily. 07/12/24  Yes Ruthell Lonni FALCON, PA-C  naproxen  (NAPROSYN ) 500 MG tablet Take 1 tablet (500 mg total) by mouth 2 (two) times daily. 07/12/24  Yes Ruthell Lonni FALCON, PA-C  amLODipine  (NORVASC ) 2.5 MG tablet Take 1 tablet (2.5 mg total) by mouth daily. 04/22/24   Glendia Shad, MD  B Complex Vitamins (B COMPLEX 100 PO) Take by mouth.    [provider]  buPROPion  (WELLBUTRIN  XL) 150 MG 24 hr tablet Take 1 tablet (150 mg total) by mouth 2 (two) times daily. 04/22/24   Glendia Shad, MD  ezetimibe  (ZETIA ) 10 MG tablet Take 1 tablet (10 mg total) by mouth daily. 04/26/24   Glendia Shad, MD  hydrochlorothiazide  (HYDRODIURIL ) 25 MG tablet Take 1 tablet (25 mg total) by mouth daily. 11/24/23 11/23/24  Glendia Shad, MD  MegaRed Omega-3 Krill Oil 500 MG CAPS Take 1 tablet by mouth daily.    [provider]  Multiple Vitamin (MULTIVITAMIN WITH MINERALS) TABS tablet  Take 1 tablet by mouth daily.    [provider]  Probiotic Product (PROBIOTIC DAILY PO) Take by mouth.    [provider]  Ruxolitinib  Phosphate (OPZELURA ) 1.5 % CREA Apply 1 application  topically daily. Apply to face daily 01/20/24   Claudene Lehmann, MD    Allergies: Keflex  Mozella ] and Mupirocin     Review of Systems  HENT:  Positive for dental problem.   All other systems reviewed and are negative.   Updated Vital Signs BP (!) 166/95   Pulse 85   Temp 98 F (36.7 C) (Oral)   Resp 18   Ht 5' 11 (1.803 m)   Wt 104.3 kg   SpO2 100%   BMI 32.08 kg/m   Physical Exam Vitals and nursing note reviewed.  Constitutional:      General: She is not in acute distress.    Appearance: She is well-developed.  HENT:     Head: Normocephalic and atraumatic.     Mouth/Throat:     Comments: Dental carry to left second molar.  Surrounding erythema.  No abscess.  Uvula midline.  Handling secretions appropriately.  No drooling.  No change phonation. Eyes:     Conjunctiva/sclera: Conjunctivae normal.  Cardiovascular:     Rate and Rhythm: Normal rate and regular rhythm.     Heart sounds: No murmur heard. Pulmonary:     Effort: Pulmonary effort is normal. No respiratory  distress.     Breath sounds: Normal breath sounds.  Abdominal:     Palpations: Abdomen is soft.     Tenderness: There is no abdominal tenderness.  Musculoskeletal:        General: No swelling.     Cervical back: Neck supple.  Skin:    General: Skin is warm and dry.     Capillary Refill: Capillary refill takes less than 2 seconds.  Neurological:     Mental Status: She is alert and oriented to person, place, and time. Mental status is at baseline.  Psychiatric:        Mood and Affect: Mood normal.     (all labs ordered are listed, but only abnormal results are displayed) Labs Reviewed - No data to display  EKG: None  Radiology: No results found.  Procedures   Medications Ordered in  the ED  amoxicillin -clavulanate (AUGMENTIN ) 875-125 MG per tablet 1 tablet (has no administration in time range)  naproxen  (NAPROSYN ) tablet 500 mg (has no administration in time range)     Medical Decision Making  58 year old female presents for evaluation of pain and irritation of left lower portion of teeth.  On exam the patient does have erythema, swelling to her left second molar with erythema.  There is no obvious abscess.  There is no signs of RPA, PTA.  Uvula is midline.  Handling secretions appropriately.  There is no drooling, there is no change in phonation.  Patient started on Augmentin , naproxen , given Magic mouthwash.  Patient was advised to follow-up with dentistry in 1 week and she voiced understanding.  She is stable to discharge.    Final diagnoses:  Pain, dental    ED Discharge Orders          Ordered    naproxen  (NAPROSYN ) 500 MG tablet  2 times daily        07/12/24 0101    amoxicillin -clavulanate (AUGMENTIN ) 875-125 MG tablet  Every 12 hours        07/12/24 0101    magic mouthwash (lidocaine , diphenhydrAMINE, alum & mag hydroxide) suspension  3 times daily        07/12/24 0101               Ruthell Lonni FALCON, PA-C 07/12/24 0102    Bari Charmaine FALCON, MD 07/14/24 [phone]

## 2024-07-13 ENCOUNTER — Other Ambulatory Visit: Payer: Self-pay

## 2024-08-06 ENCOUNTER — Other Ambulatory Visit: Payer: Self-pay

## 2024-08-06 MED FILL — Hydrochlorothiazide Tab 25 MG: ORAL | 90 days supply | Qty: 90 | Fill #2 | Status: AC

## 2024-08-09 ENCOUNTER — Telehealth: Payer: Self-pay

## 2024-08-09 NOTE — Telephone Encounter (Signed)
 Not in office today. Reviewed. Has appt with me 08/10/24. D/w her more at appt.

## 2024-08-09 NOTE — Telephone Encounter (Signed)
 Copied from CRM #[id number]. Topic: General - Other >> Aug 09, 2024 12:12 PM Sasha M wrote: Reason for CRM: Pt has an emergency situation with taking temporary custody of grandchildren and needs help submitting intermittent FMLA paperwork. Childrens mother was taken off life support yesterday and pt isn't sure if Dr Glendia can assist or if she needs to go to the children's provider. She has had guardianship for over 10 years of the children already but now legal custody matters are in play. She has missed several days of work already and may have to miss more and she is requesting assistance with protecting her job status. Note that she has an appt tomorrow afternoon but isn't sure if there is paperwork she could bring or what to expect. Please call pt to advise at [phone]

## 2024-08-10 ENCOUNTER — Other Ambulatory Visit: Payer: Self-pay

## 2024-08-10 ENCOUNTER — Ambulatory Visit (INDEPENDENT_AMBULATORY_CARE_PROVIDER_SITE_OTHER): Admitting: Internal Medicine

## 2024-08-10 VITALS — BP 138/84 | HR 72 | Temp 98.6°F | Ht 70.0 in | Wt 229.0 lb

## 2024-08-10 DIAGNOSIS — E7800 Pure hypercholesterolemia, unspecified: Secondary | ICD-10-CM | POA: Diagnosis not present

## 2024-08-10 DIAGNOSIS — Z0000 Encounter for general adult medical examination without abnormal findings: Secondary | ICD-10-CM | POA: Diagnosis not present

## 2024-08-10 DIAGNOSIS — F439 Reaction to severe stress, unspecified: Secondary | ICD-10-CM

## 2024-08-10 DIAGNOSIS — Z720 Tobacco use: Secondary | ICD-10-CM | POA: Diagnosis not present

## 2024-08-10 DIAGNOSIS — R739 Hyperglycemia, unspecified: Secondary | ICD-10-CM | POA: Diagnosis not present

## 2024-08-10 DIAGNOSIS — E0789 Other specified disorders of thyroid: Secondary | ICD-10-CM | POA: Diagnosis not present

## 2024-08-10 DIAGNOSIS — N83209 Unspecified ovarian cyst, unspecified side: Secondary | ICD-10-CM

## 2024-08-10 DIAGNOSIS — I10 Essential (primary) hypertension: Secondary | ICD-10-CM | POA: Diagnosis not present

## 2024-08-10 DIAGNOSIS — Z1231 Encounter for screening mammogram for malignant neoplasm of breast: Secondary | ICD-10-CM | POA: Diagnosis not present

## 2024-08-10 DIAGNOSIS — I700 Atherosclerosis of aorta: Secondary | ICD-10-CM

## 2024-08-10 MED ORDER — AMLODIPINE BESYLATE 2.5 MG PO TABS
2.5000 mg | ORAL_TABLET | Freq: Every day | ORAL | 1 refills | Status: AC
  Filled 2024-08-10 – 2024-09-01 (×2): qty 90, 90d supply, fill #0

## 2024-08-10 MED ORDER — HYDROCHLOROTHIAZIDE 25 MG PO TABS
25.0000 mg | ORAL_TABLET | Freq: Every day | ORAL | 2 refills | Status: AC
  Filled 2024-08-10 – 2024-11-11 (×2): qty 90, 90d supply, fill #0

## 2024-08-10 MED ORDER — EZETIMIBE 10 MG PO TABS
10.0000 mg | ORAL_TABLET | Freq: Every day | ORAL | 3 refills | Status: AC
  Filled 2024-08-10 – 2024-09-09 (×2): qty 30, 30d supply, fill #0
  Filled 2024-10-08: qty 30, 30d supply, fill #1
  Filled 2024-10-08: qty 90, 90d supply, fill #1

## 2024-08-10 MED ORDER — BUPROPION HCL ER (XL) 150 MG PO TB24
150.0000 mg | ORAL_TABLET | Freq: Two times a day (BID) | ORAL | 1 refills | Status: AC
  Filled 2024-08-10 – 2024-09-03 (×2): qty 180, 90d supply, fill #0

## 2024-08-10 NOTE — Assessment & Plan Note (Signed)
 Physical today 08/10/24.  PAP 05/29/23 - negative with negative HPV. colonoscopy 11/2017 - recommended f/u in 10 years.  Mammogram 08/26/23 - Birads I. Schedule f/u mammogram.

## 2024-08-10 NOTE — Progress Notes (Signed)
 Subjective:    [name], female    DOB: 1966-04-27, 58 y.o.   MRN: [id number]  Patient here for  Chief Complaint  Patient presents with   Annual Exam    HPI Here for a physical exam. Recently evaluated by Dr Cherilyn for thyroid  nodule. Biopsy 10/29/23 - per her report - ok. Have discussed f/u regarding ovarian cyst and pelvic ultrasound. She previously wanted to hold on further testing. Comes in today for a physical. Increased stress. Her grandchildren's mother - taken off life support this week. She has been keeping her grandchildren for the last 10 years. Has good support and help from her family. Discussed with the current situation, may need some time off work. Discussed FMLA. She will provide information or forms if need completed. Overall appears to be handling things relatively well. Breathing stable.    Past Medical History:  Diagnosis Date   Hyperlipidemia    Hypertension    Past Surgical History:  Procedure Laterality Date   VAGINAL DELIVERY  1989   episiotomy, Dr. Jonni   Family History  Problem Relation Age of Onset   Diabetes Mother    Dementia Mother    Stroke Father 41   Hypertension Sister    Thyroid  disease Sister    Diabetes Sister    Hypertension Brother    Diabetes Brother    Hypertension Sister    Hypertension Brother    Hypertension Brother    Hypertension Brother    Hypertension Sister    Breast cancer Neg Hx    Lupus Neg Hx    Social History   Socioeconomic History   Marital status: Single    Spouse name: Not on file   Number of children: Not on file   Years of education: Not on file   Highest education level: Associate degree: occupational, scientist, product/process development, or vocational program  Occupational History   Not on file  Tobacco Use   Smoking status: Some Days    Current packs/day: 0.50    Average packs/day: 0.5 packs/day for 15.0 years (7.5 ttl pk-yrs)    Types: Cigarettes   Smokeless tobacco: Never  Vaping Use    Vaping status: Never Used  Substance and Sexual Activity   Alcohol use: Yes    Alcohol/week: 0.0 standard drinks of alcohol    Comment: 2-3 times a week per patient   Drug use: No   Sexual activity: Yes    Birth control/protection: Implant  Other Topics Concern   Not on file  Social History Narrative   Lives in [place]. 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 2 grandchildren, 1 son.      Diet: regular diet, limits fried food   Exercise: walks goal daily   Social Drivers of Health   Financial Resource Strain: Low Risk  (08/10/2024)   Overall Financial Resource Strain (CARDIA)    Difficulty of Paying Living Expenses: Not hard at all  Food Insecurity: No Food Insecurity (08/10/2024)   Hunger Vital Sign    Worried About Running Out of Food in the Last Year: Never true    Ran Out of Food in the Last Year: Never true  Transportation Needs: No Transportation Needs (08/10/2024)   PRAPARE - Administrator, Civil Service (Medical): No    Lack of Transportation (Non-Medical): No  Physical Activity: Insufficiently Active (08/10/2024)   Exercise Vital Sign    Days of Exercise per Week: 2 days    Minutes of Exercise per  Session: 60 min  Stress: Patient Declined (08/10/2024)   [employer] of Occupational Health - Occupational Stress Questionnaire    Feeling of Stress: Patient declined  Social Connections: Socially Isolated (08/10/2024)   Social Connection and Isolation Panel    Frequency of Communication with Friends and Family: More than three times a week    Frequency of Social Gatherings with Friends and Family: More than three times a week    Attends Religious Services: Patient declined    Database Administrator or Organizations: No    Attends Engineer, Structural: Not on file    Marital Status: Never married     Review of Systems  Constitutional:  Negative for appetite change and unexpected weight change.  HENT:  Negative for congestion, sinus  pressure and sore throat.   Eyes:  Negative for pain and visual disturbance.  Respiratory:  Negative for cough, chest tightness and shortness of breath.   Cardiovascular:  Negative for chest pain and palpitations.       No increased swelling   Gastrointestinal:  Negative for abdominal pain, diarrhea, nausea and vomiting.  Genitourinary:  Negative for difficulty urinating and dysuria.  Musculoskeletal:  Negative for joint swelling and myalgias.  Skin:  Negative for color change and rash.  Neurological:  Negative for dizziness and headaches.  Hematological:  Negative for adenopathy. Does not bruise/bleed easily.  Psychiatric/Behavioral:  Negative for agitation and dysphoric mood.        Objective:     BP 138/84   Pulse 72   Temp 98.6 F (37 C) (Oral)   Ht 5' 10 (1.778 m)   Wt 229 lb (103.9 kg)   SpO2 98%   BMI 32.86 kg/m  Wt Readings from Last 3 Encounters:  08/10/24 229 lb (103.9 kg)  05/26/24 234 lb 3.2 oz (106.2 kg)  04/22/24 232 lb 6.4 oz (105.4 kg)    Physical Exam Vitals reviewed.  Constitutional:      General: She is not in acute distress.    Appearance: Normal appearance. She is well-developed.  HENT:     Head: Normocephalic and atraumatic.     Right Ear: External ear normal.     Left Ear: External ear normal.     Mouth/Throat:     Pharynx: No oropharyngeal exudate or posterior oropharyngeal erythema.  Eyes:     General: No scleral icterus.       Right eye: No discharge.        Left eye: No discharge.     Conjunctiva/sclera: Conjunctivae normal.  Neck:     Thyroid : No thyromegaly.  Cardiovascular:     Rate and Rhythm: Normal rate and regular rhythm.  Pulmonary:     Effort: No tachypnea, accessory muscle usage or respiratory distress.     Breath sounds: Normal breath sounds. No decreased breath sounds or wheezing.  Chest:  Breasts:    Right: No inverted nipple, mass, nipple discharge or tenderness (no axillary adenopathy).     Left: No inverted nipple,  mass, nipple discharge or tenderness (no axilarry adenopathy).  Abdominal:     General: Bowel sounds are normal.     Palpations: Abdomen is soft.     Tenderness: There is no abdominal tenderness.  Musculoskeletal:        General: No swelling or tenderness.     Cervical back: Neck supple.  Lymphadenopathy:     Cervical: No cervical adenopathy.  Skin:    Findings: No erythema or rash.  Neurological:  Mental Status: She is alert and oriented to person, place, and time.  Psychiatric:        Mood and Affect: Mood normal.        Behavior: Behavior normal.         Outpatient Encounter Medications as of 08/10/2024  Medication Sig   B Complex Vitamins (B COMPLEX 100 PO) Take by mouth.   Multiple Vitamin (MULTIVITAMIN WITH MINERALS) TABS tablet Take 1 tablet by mouth daily.   Probiotic Product (PROBIOTIC DAILY PO) Take by mouth.   Ruxolitinib  Phosphate (OPZELURA ) 1.5 % CREA Apply 1 application  topically daily. Apply to face daily   amLODipine  (NORVASC ) 2.5 MG tablet Take 1 tablet (2.5 mg total) by mouth daily.   buPROPion  (WELLBUTRIN  XL) 150 MG 24 hr tablet Take 1 tablet (150 mg total) by mouth 2 (two) times daily.   ezetimibe  (ZETIA ) 10 MG tablet Take 1 tablet (10 mg total) by mouth daily.   hydrochlorothiazide  (HYDRODIURIL ) 25 MG tablet Take 1 tablet (25 mg total) by mouth daily.   [DISCONTINUED] amLODipine  (NORVASC ) 2.5 MG tablet Take 1 tablet (2.5 mg total) by mouth daily.   [DISCONTINUED] amoxicillin -clavulanate (AUGMENTIN ) 875-125 MG tablet Take 1 tablet by mouth every 12 (twelve) hours.   [DISCONTINUED] buPROPion  (WELLBUTRIN  XL) 150 MG 24 hr tablet Take 1 tablet (150 mg total) by mouth 2 (two) times daily.   [DISCONTINUED] ezetimibe  (ZETIA ) 10 MG tablet Take 1 tablet (10 mg total) by mouth daily.   [DISCONTINUED] hydrochlorothiazide  (HYDRODIURIL ) 25 MG tablet Take 1 tablet (25 mg total) by mouth daily.   [DISCONTINUED] magic mouthwash (lidocaine , diphenhydrAMINE, alum & mag  hydroxide) suspension Swish and spit 5 mLs 3 (three) times daily.   [DISCONTINUED] MegaRed Omega-3 Krill Oil 500 MG CAPS Take 1 tablet by mouth daily.   [DISCONTINUED] naproxen  (NAPROSYN ) 500 MG tablet Take 1 tablet (500 mg total) by mouth 2 (two) times daily.   No facility-administered encounter medications on file as of 08/10/2024.     Lab Results  Component Value Date   WBC 5.8 04/22/2024   HGB 14.5 04/22/2024   HCT 43.7 04/22/2024   PLT 236 04/22/2024   GLUCOSE 95 04/22/2024   CHOL 237 (H) 04/22/2024   TRIG 116 04/22/2024   HDL 74 04/22/2024   LDLCALC 143 (H) 04/22/2024   ALT 22 04/22/2024   AST 27 04/22/2024   NA 140 04/22/2024   K 4.2 04/22/2024   CL 100 04/22/2024   CREATININE 0.77 04/22/2024   BUN 12 04/22/2024   CO2 20 04/22/2024   TSH 0.693 04/22/2024   HGBA1C 6.0 (H) 04/22/2024       Assessment & Plan:  Routine general medical examination at a health care facility  Hypercholesterolemia Assessment & Plan: Intolerant to statin medication. Have discussed other treatment options. Continue low choelsterol diet and exercise.check lipid panel.   Orders: -     Lipid panel; Future -     Hepatic function panel; Future  Primary hypertension Assessment & Plan: Continues on hydrochlorothiazide  and amlodipine . No changes in medication. Follow metabolic panel.    Orders: -     Basic metabolic panel with GFR; Future  Hyperglycemia Assessment & Plan: Low carb diet and exercise. Follow met b and A1c.   Orders: -     Hemoglobin A1c; Future  Health care maintenance Assessment & Plan: Physical today 08/10/24.  PAP 05/29/23 - negative with negative HPV. colonoscopy 11/2017 - recommended f/u in 10 years.  Mammogram 08/26/23 - Birads I. Schedule f/u  mammogram.    Encounter for screening mammogram for malignant neoplasm of breast -     3D Screening Mammogram, Left and Right; Future  Tobacco use Assessment & Plan: Just had lung screening 01/2024. Recommended continuing  annual screening. Discussed smoking. Continues to smoke follow.    Thyroid  fullness Assessment & Plan: Recently evaluated by Dr Cherilyn for thyroid  nodule. Biopsy 10/29/23 - per her report - ok.    Stress Assessment & Plan: Continues on wellbutrin . Overall appears to be handling things relatively well. Has good family support. Follow.    Cyst of ovary, unspecified laterality Assessment & Plan: Saw Dr Leonce 05/2022.  Per note review, had recommended f/u. Has previously wanted to hold on further f/u. Will need f/u when able.    Aortic atherosclerosis Assessment & Plan: Intolerant to pravastatin .    Other orders -     amLODIPine  Besylate; Take 1 tablet (2.5 mg total) by mouth daily.  Dispense: 90 tablet; Refill: 1 -     buPROPion  HCl ER (XL); Take 1 tablet (150 mg total) by mouth 2 (two) times daily.  Dispense: 180 tablet; Refill: 1 -     Ezetimibe ; Take 1 tablet (10 mg total) by mouth daily.  Dispense: 30 tablet; Refill: 3 -     hydroCHLOROthiazide ; Take 1 tablet (25 mg total) by mouth daily.  Dispense: 90 tablet; Refill: 2     Allena Hamilton, MD

## 2024-08-15 ENCOUNTER — Encounter: Payer: Self-pay | Admitting: Internal Medicine

## 2024-08-15 NOTE — Assessment & Plan Note (Signed)
 Saw Dr Leonce 05/2022.  Per note review, had recommended f/u. Has previously wanted to hold on further f/u. Will need f/u when able.

## 2024-08-15 NOTE — Assessment & Plan Note (Signed)
 Continues on hydrochlorothiazide  and amlodipine . No changes in medication. Follow metabolic panel.

## 2024-08-15 NOTE — Assessment & Plan Note (Signed)
 Continues on wellbutrin . Overall appears to be handling things relatively well. Has good family support. Follow.

## 2024-08-15 NOTE — Assessment & Plan Note (Signed)
 Low-carb diet and exercise.  Follow met b and A1c.

## 2024-08-15 NOTE — Assessment & Plan Note (Signed)
 Just had lung screening 01/2024. Recommended continuing annual screening. Discussed smoking. Continues to smoke follow.

## 2024-08-15 NOTE — Assessment & Plan Note (Signed)
 Recently evaluated by Dr Cherilyn for thyroid  nodule. Biopsy 10/29/23 - per her report - ok.

## 2024-08-15 NOTE — Assessment & Plan Note (Signed)
 Intolerant to pravastatin .

## 2024-08-15 NOTE — Assessment & Plan Note (Signed)
 Intolerant to statin medication. Have discussed other treatment options. Continue low choelsterol diet and exercise.check lipid panel.

## 2024-08-23 ENCOUNTER — Other Ambulatory Visit: Payer: Self-pay

## 2024-09-01 ENCOUNTER — Other Ambulatory Visit: Payer: Self-pay

## 2024-09-02 ENCOUNTER — Other Ambulatory Visit

## 2024-09-02 DIAGNOSIS — I10 Essential (primary) hypertension: Secondary | ICD-10-CM

## 2024-09-02 DIAGNOSIS — R739 Hyperglycemia, unspecified: Secondary | ICD-10-CM | POA: Diagnosis not present

## 2024-09-02 DIAGNOSIS — E7800 Pure hypercholesterolemia, unspecified: Secondary | ICD-10-CM | POA: Diagnosis not present

## 2024-09-02 LAB — BASIC METABOLIC PANEL WITH GFR
BUN: 16 mg/dL (ref 6–23)
CO2: 28 meq/L (ref 19–32)
Calcium: 8.9 mg/dL (ref 8.4–10.5)
Chloride: 105 meq/L (ref 96–112)
Creatinine, Ser: 0.67 mg/dL (ref 0.40–1.20)
GFR: 97.08 mL/min (ref >60.00)
Glucose, Bld: 95 mg/dL (ref 70–99)
Potassium: 4.2 meq/L (ref 3.5–5.1)
Sodium: 140 meq/L (ref 135–145)

## 2024-09-02 LAB — HEMOGLOBIN A1C: Hgb A1c MFr Bld: 6.1 % (ref 4.6–6.5)

## 2024-09-02 LAB — HEPATIC FUNCTION PANEL
ALT: 29 U/L (ref 0–35)
AST: 22 U/L (ref 0–37)
Albumin: 4.2 g/dL (ref 3.5–5.2)
Alkaline Phosphatase: 74 U/L (ref 39–117)
Bilirubin, Direct: 0.1 mg/dL (ref 0.0–0.3)
Total Bilirubin: 0.6 mg/dL (ref 0.2–1.2)
Total Protein: 6.5 g/dL (ref 6.0–8.3)

## 2024-09-02 LAB — LIPID PANEL
Cholesterol: 194 mg/dL (ref 0–200)
HDL: 75.1 mg/dL (ref >39.00)
LDL Cholesterol: 105 mg/dL — ABNORMAL HIGH (ref 0–99)
NonHDL: 118.69
Total CHOL/HDL Ratio: 3
Triglycerides: 68 mg/dL (ref 0.0–149.0)
VLDL: 13.6 mg/dL (ref 0.0–40.0)

## 2024-09-03 ENCOUNTER — Other Ambulatory Visit: Payer: Self-pay

## 2024-09-06 ENCOUNTER — Ambulatory Visit: Payer: Self-pay | Admitting: Internal Medicine

## 2024-09-07 ENCOUNTER — Other Ambulatory Visit: Payer: Self-pay

## 2024-09-08 NOTE — Telephone Encounter (Signed)
 open in error

## 2024-09-09 ENCOUNTER — Other Ambulatory Visit: Payer: Self-pay

## 2024-10-08 ENCOUNTER — Other Ambulatory Visit: Payer: Self-pay

## 2024-11-11 ENCOUNTER — Other Ambulatory Visit: Payer: Self-pay

## 2024-11-16 ENCOUNTER — Other Ambulatory Visit: Payer: Self-pay

## 2024-12-15 ENCOUNTER — Ambulatory Visit: Admitting: Internal Medicine

## 2025-01-20 ENCOUNTER — Ambulatory Visit: Admitting: Dermatology
